# Patient Record
Sex: Male | Born: 1995
Health system: Southern US, Community
[De-identification: ages and names within clinical notes are randomized; demographics above are authoritative.]

## PROBLEM LIST (undated history)

## (undated) DIAGNOSIS — L0591 Pilonidal cyst without abscess: Secondary | ICD-10-CM

## (undated) DIAGNOSIS — J45909 Unspecified asthma, uncomplicated: Secondary | ICD-10-CM

## (undated) DIAGNOSIS — T7840XA Allergy, unspecified, initial encounter: Secondary | ICD-10-CM

## (undated) DIAGNOSIS — J982 Interstitial emphysema: Secondary | ICD-10-CM

## (undated) DIAGNOSIS — J069 Acute upper respiratory infection, unspecified: Secondary | ICD-10-CM

## (undated) DIAGNOSIS — F419 Anxiety disorder, unspecified: Secondary | ICD-10-CM

## (undated) HISTORY — DX: Pilonidal cyst without abscess: L05.91

## (undated) HISTORY — DX: Unspecified asthma, uncomplicated: J45.909

## (undated) HISTORY — DX: Anxiety disorder, unspecified: F41.9

## (undated) HISTORY — DX: Acute upper respiratory infection, unspecified: J06.9

## (undated) HISTORY — DX: Allergy, unspecified, initial encounter: T78.40XA

## (undated) HISTORY — PX: ADENOIDECTOMY: SUR15

## (undated) HISTORY — PX: TONSILLECTOMY: SUR1361

---
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
## 2015-02-15 ENCOUNTER — Ambulatory Visit (INDEPENDENT_AMBULATORY_CARE_PROVIDER_SITE_OTHER): Payer: 59 | Admitting: Urgent Care

## 2015-02-15 VITALS — BP 143/76 | HR 98 | Temp 98.2°F | Resp 20 | Ht 71.0 in | Wt 217.2 lb

## 2015-02-15 DIAGNOSIS — E669 Obesity, unspecified: Secondary | ICD-10-CM

## 2015-02-15 DIAGNOSIS — Z0000 Encounter for general adult medical examination without abnormal findings: Secondary | ICD-10-CM | POA: Diagnosis not present

## 2015-02-15 DIAGNOSIS — R635 Abnormal weight gain: Secondary | ICD-10-CM | POA: Diagnosis not present

## 2015-02-15 DIAGNOSIS — J302 Other seasonal allergic rhinitis: Secondary | ICD-10-CM

## 2015-02-15 DIAGNOSIS — F419 Anxiety disorder, unspecified: Secondary | ICD-10-CM

## 2015-02-15 DIAGNOSIS — J4520 Mild intermittent asthma, uncomplicated: Secondary | ICD-10-CM | POA: Diagnosis not present

## 2015-02-15 DIAGNOSIS — G4700 Insomnia, unspecified: Secondary | ICD-10-CM | POA: Diagnosis not present

## 2015-02-15 LAB — CBC
HCT: 44.2 % (ref 39.0–52.0)
Hemoglobin: 15 g/dL (ref 13.0–17.0)
MCH: 27.9 pg (ref 26.0–34.0)
MCHC: 33.9 g/dL (ref 30.0–36.0)
MCV: 82.2 fL (ref 78.0–100.0)
MPV: 10.5 fL (ref 8.6–12.4)
Platelets: 178 10*3/uL (ref 150–400)
RBC: 5.38 MIL/uL (ref 4.22–5.81)
RDW: 13 % (ref 11.5–15.5)
WBC: 5.9 10*3/uL (ref 4.0–10.5)

## 2015-02-15 LAB — TSH: TSH: 1.687 u[IU]/mL (ref 0.350–4.500)

## 2015-02-15 LAB — COMPREHENSIVE METABOLIC PANEL
ALT: 20 U/L (ref 9–46)
AST: 18 U/L (ref 10–40)
Albumin: 4.6 g/dL (ref 3.6–5.1)
Alkaline Phosphatase: 53 U/L (ref 40–115)
BUN: 14 mg/dL (ref 7–25)
CO2: 28 mmol/L (ref 20–31)
Calcium: 9.3 mg/dL (ref 8.6–10.3)
Chloride: 102 mmol/L (ref 98–110)
Creat: 0.94 mg/dL (ref 0.60–1.35)
Glucose, Bld: 85 mg/dL (ref 65–99)
Potassium: 4.4 mmol/L (ref 3.5–5.3)
Sodium: 140 mmol/L (ref 135–146)
Total Bilirubin: 0.5 mg/dL (ref 0.2–1.2)
Total Protein: 6.9 g/dL (ref 6.1–8.1)

## 2015-02-15 LAB — LIPID PANEL
Cholesterol: 202 mg/dL — ABNORMAL HIGH (ref 125–170)
HDL: 43 mg/dL (ref >=40)
LDL Cholesterol: 147 mg/dL — ABNORMAL HIGH (ref <110)
Total CHOL/HDL Ratio: 4.7 Ratio (ref <=5.0)
Triglycerides: 62 mg/dL (ref <150)
VLDL: 12 mg/dL (ref <30)

## 2015-02-15 LAB — POCT GLYCOSYLATED HEMOGLOBIN (HGB A1C): Hemoglobin A1C: 5.5

## 2015-02-15 MED ORDER — CETIRIZINE HCL 10 MG PO TABS: 10.0000 mg | ORAL_TABLET | Freq: Every day | ORAL | Status: DC

## 2015-02-15 MED ORDER — ESCITALOPRAM OXALATE 10 MG PO TABS: 10.0000 mg | ORAL_TABLET | Freq: Every day | ORAL | Status: DC

## 2015-02-15 MED ORDER — ALBUTEROL SULFATE HFA 108 (90 BASE) MCG/ACT IN AERS: 2.0000 | INHALATION_SPRAY | Freq: Four times a day (QID) | RESPIRATORY_TRACT | Status: DC | PRN

## 2015-02-15 MED ORDER — LORAZEPAM 0.5 MG PO TABS: 0.5000 mg | ORAL_TABLET | Freq: Two times a day (BID) | ORAL | Status: DC | PRN

## 2015-02-15 NOTE — Patient Instructions (Addendum)
Keeping you healthy  Get these tests  Blood pressure- Have your blood pressure checked once a year by your healthcare provider.  Normal blood pressure is 120/80.  Weight- Have your body mass index (BMI) calculated to screen for obesity.  BMI is a measure of body fat based on height and weight. You can also calculate your own BMI at https://www.west-esparza.com/.  Cholesterol- Have your cholesterol checked regularly starting at age 21, sooner may be necessary if you have diabetes, high blood pressure, if a family member developed heart diseases at an early age or if you smoke.   Chlamydia, HIV, and other sexual transmitted disease- Get screened each year until the age of 38 then within three months of each new sexual partner.  Diabetes- Have your blood sugar checked regularly if you have high blood pressure, high cholesterol, a family history of diabetes or if you are overweight.  Get these vaccines  Flu shot- Every fall.  Tetanus shot- Every 10 years.  Menactra- Single dose; prevents meningitis.  Take these steps  Don't smoke- If you do smoke, ask your healthcare provider about quitting. For tips on how to quit, go to www.smokefree.gov or call 1-800-QUIT-NOW.  Be physically active- Exercise 5 days a week for at least 30 minutes.  If you are not already physically active start slow and gradually work up to 30 minutes of moderate physical activity.  Examples of moderate activity include walking briskly, mowing the yard, dancing, swimming bicycling, etc.  Eat a healthy diet- Eat a variety of healthy foods such as fruits, vegetables, low fat milk, low fat cheese, yogurt, lean meats, poultry, fish, beans, tofu, etc.  For more information on healthy eating, go to www.thenutritionsource.org  Drink alcohol in moderation- Limit alcohol intake two drinks or less a day.  Never drink and drive.  Dentist- Brush and floss teeth twice daily; visit your dentis twice a year.  Depression-Your emotional  health is as important as your physical health.  If you're feeling down, losing interest in things you normally enjoy please talk with your healthcare provider.  Gun Safety- If you keep a gun in your home, keep it unloaded and with the safety lock on.  Bullets should be stored separately.  Helmet use- Always wear a helmet when riding a motorcycle, bicycle, rollerblading or skateboarding.  Safe sex- If you may be exposed to a sexually transmitted infection, use a condom  Seat belts- Seat bels can save your life; always wear one.  Smoke/Carbon Monoxide detectors- These detectors need to be installed on the appropriate level of your home.  Replace batteries at least once a year.  Skin Cancer- When out in the sun, cover up and use sunscreen SPF 15 or higher.  Violence- If anyone is threatening or hurting you, please tell your healthcare provider.   Generalized Anxiety Disorder Generalized anxiety disorder (GAD) is a mental disorder. It interferes with life functions, including relationships, work, and school. GAD is different from normal anxiety, which everyone experiences at some point in their lives in response to specific life events and activities. Normal anxiety actually helps [place] prepare for and get through these life events and activities. Normal anxiety goes away after the event or activity is over.  GAD causes anxiety that is not necessarily related to specific events or activities. It also causes excess anxiety in proportion to specific events or activities. The anxiety associated with GAD is also difficult to control. GAD can vary from mild to severe. People with severe GAD can  have intense waves of anxiety with physical symptoms (panic attacks).  SYMPTOMS The anxiety and worry associated with GAD are difficult to control. This anxiety and worry are related to many life events and activities and also occur more days than not for 6 months or longer. People with GAD also have three or more  of the following symptoms (one or more in children): 6. Restlessness.  7. Fatigue. 8. Difficulty concentrating.  9. Irritability. 10. Muscle tension. 11. Difficulty sleeping or unsatisfying sleep. DIAGNOSIS GAD is diagnosed through an assessment by your health care provider. Your health care provider will ask you questions aboutyour mood,physical symptoms, and events in your life. Your health care provider may ask you about your medical history and use of alcohol or drugs, including prescription medicines. Your health care provider may also do a physical exam and blood tests. Certain medical conditions and the use of certain substances can cause symptoms similar to those associated with GAD. Your health care provider may refer you to a mental health specialist for further evaluation. TREATMENT The following therapies are usually used to treat GAD:  4. Medication. Antidepressant medication usually is prescribed for long-term daily control. Antianxiety medicines may be added in severe cases, especially when panic attacks occur.  5. Talk therapy (psychotherapy). Certain types of talk therapy can be helpful in treating GAD by providing support, education, and guidance. A form of talk therapy called cognitive behavioral therapy can teach you healthy ways to think about and react to daily life events and activities. 6. Stress managementtechniques. These include yoga, meditation, and exercise and can be very helpful when they are practiced regularly. A mental health specialist can help determine which treatment is best for you. Some people see improvement with one therapy. However, other people require a combination of therapies.   This information is not intended to replace advice given to you by your health care provider. Make sure you discuss any questions you have with your health care provider.   Document Released: 05/03/2012 Document Revised: 01/27/2014 Document Reviewed: 05/03/2012 Elsevier  Interactive Patient Education 2016 [employer].    Escitalopram tablets What is this medicine? ESCITALOPRAM (es sye TAL oh pram) is used to treat depression and certain types of anxiety. This medicine may be used for other purposes; ask your health care provider or pharmacist if you have questions. What should I tell my health care provider before I take this medicine? They need to know if you have any of these conditions: -bipolar disorder or a family history of bipolar disorder -diabetes -glaucoma -heart disease -kidney or liver disease -receiving electroconvulsive therapy -seizures (convulsions) -suicidal thoughts, plans, or attempt by you or a family member -an unusual or allergic reaction to escitalopram, the related drug citalopram, other medicines, foods, dyes, or preservatives -pregnant or trying to become pregnant -breast-feeding How should I use this medicine? Take this medicine by mouth with a glass of water. Follow the directions on the prescription label. You can take it with or without food. If it upsets your stomach, take it with food. Take your medicine at regular intervals. Do not take it more often than directed. Do not stop taking this medicine suddenly except upon the advice of your doctor. Stopping this medicine too quickly may cause serious side effects or your condition may worsen. A special MedGuide will be given to you by the pharmacist with each prescription and refill. Be sure to read this information carefully each time. Talk to your pediatrician regarding the use of this  medicine in children. Special care may be needed. Overdosage: If you think you have taken too much of this medicine contact a poison control center or emergency room at once. NOTE: This medicine is only for you. Do not share this medicine with others. What if I miss a dose? If you miss a dose, take it as soon as you can. If it is almost time for your next dose, take only that dose. Do not  take double or extra doses. What may interact with this medicine? Do not take this medicine with any of the following medications: -certain medicines for fungal infections like fluconazole, itraconazole, ketoconazole, posaconazole, voriconazole -cisapride -citalopram -dofetilide -dronedarone -linezolid -MAOIs like Carbex, Eldepryl, Marplan, Nardil, and Parnate -methylene blue (injected into a vein) -pimozide -thioridazine -ziprasidone This medicine may also interact with the following medications: -alcohol -aspirin and aspirin-like medicines -carbamazepine -certain medicines for depression, anxiety, or psychotic disturbances -certain medicines for migraine headache like almotriptan, eletriptan, frovatriptan, naratriptan, rizatriptan, sumatriptan, zolmitriptan -certain medicines for sleep -certain medicines that treat or prevent blood clots like warfarin, enoxaparin, dalteparin -cimetidine -diuretics -fentanyl -furazolidone -isoniazid -lithium -metoprolol -NSAIDs, medicines for pain and inflammation, like ibuprofen or naproxen -other medicines that prolong the QT interval (cause an abnormal heart rhythm) -procarbazine -rasagiline -supplements like St. John's wort, kava kava, valerian -tramadol -tryptophan This list may not describe all possible interactions. Give your health care provider a list of all the medicines, herbs, non-prescription drugs, or dietary supplements you use. Also tell them if you smoke, drink alcohol, or use illegal drugs. Some items may interact with your medicine. What should I watch for while using this medicine? Tell your doctor if your symptoms do not get better or if they get worse. Visit your doctor or health care professional for regular checks on your progress. Because it may take several weeks to see the full effects of this medicine, it is important to continue your treatment as prescribed by your doctor. Patients and their families should watch  out for new or worsening thoughts of suicide or depression. Also watch out for sudden changes in feelings such as feeling anxious, agitated, panicky, irritable, hostile, aggressive, impulsive, severely restless, overly excited and hyperactive, or not being able to sleep. If this happens, especially at the beginning of treatment or after a change in dose, call your health care professional. Bonita Quin may get drowsy or dizzy. Do not drive, use machinery, or do anything that needs mental alertness until you know how this medicine affects you. Do not stand or sit up quickly, especially if you are an older patient. This reduces the risk of dizzy or fainting spells. Alcohol may interfere with the effect of this medicine. Avoid alcoholic drinks. Your mouth may get dry. Chewing sugarless gum or sucking hard candy, and drinking plenty of water may help. Contact your doctor if the problem does not go away or is severe. What side effects may I notice from receiving this medicine? Side effects that you should report to your doctor or health care professional as soon as possible: -allergic reactions like skin rash, itching or hives, swelling of the face, lips, or tongue -confusion -feeling faint or lightheaded, falls -fast talking and excited feelings or actions that are out of control -hallucination, loss of contact with reality -seizures -suicidal thoughts or other mood changes -unusual bleeding or bruising Side effects that usually do not require medical attention (report to your doctor or health care professional if they continue or are bothersome): -blurred vision -changes in appetite -  change in sex drive or performance -headache -increased sweating -nausea This list may not describe all possible side effects. Call your doctor for medical advice about side effects. You may report side effects to FDA at 1-800-FDA-1088. Where should I keep my medicine? Keep out of reach of children. Store at room temperature  between 15 and 30 degrees C (59 and 86 degrees F). Throw away any unused medicine after the expiration date. NOTE: This sheet is a summary. It may not cover all possible information. If you have questions about this medicine, talk to your doctor, pharmacist, or health care provider.    2016, Elsevier/Gold Standard. (2012-08-03 12:32:55)

## 2015-02-15 NOTE — Progress Notes (Signed)
MRN: [id number]  Subjective:   [name] is a 20 y.o. male presenting for annual physical exam and anxiety, dizziness, weight loss.  Medical care team includes: PCP: No primary care provider on file. Specialists: None.   Patient is single, lives with his father. Recently moved here from [place] where he had been living since 2011. Denies smoking cigarettes or drinking alcohol.   Anxiety, weight, dizziness, chest pain - In 2015-2016, patient went from 230lbs to 180lbs without dieting or exercise. He had decreased appetite, intermittent nausea and vomiting, intermittent headaches, dizziness. Work up was negative per patient. Since 12/2014, patient has gained weight back and has had difficulty with binge eating. He states that he eats healthily but also reports that he finds whatever food he can get at home. Patient has long-standing history of anxiety. In 2016, he started having episodes of shaking, crying spells, feeling overwhelmed, heart racing. Episodes have been occuring daily in the past week. On other occasions, patient has had chest pain which was evaluated in the ED in [place] had normal findings and recommendations for treatment of anxiety. Patient has tried very hard to calm himself during these episodes but is having a very difficult time managing his anxiety.    Michael Lam  does not have a problem list on file.  Michael Lam has a current medication list which includes the following prescription(s): albuterol, albuterol, loratadine, and montelukast. He has No Known Allergies.  Michael Lam  has a past medical history of Allergy; Anxiety; and Asthma. Also  has past surgical history that includes Tonsillectomy.  His family history includes Diabetes in his father; Hyperlipidemia in his father; Hypertension in his father.  Immunizations: Patient reports that he is up to date.  ROS   Objective:   Vitals: BP 143/76 mmHg  Pulse 98  Temp(Src) 98.2 F (36.8 C) (Oral)   Resp 20  Ht  (1.803 m)  Wt 217 lb 3.2 oz (98.521 kg)  BMI 30.31 kg/m2  SpO2 99%  Physical Exam  Constitutional: He is oriented to person, place, and time. He appears well-developed and well-nourished.  HENT:  TM's flat bilaterally, no effusions or erythema. Nasal turbinates boggy and edematous. No sinus tenderness. Postnasal drip present, without oropharyngeal exudates, erythema or abscesses.  Eyes: Conjunctivae and EOM are normal. Pupils are equal, round, and reactive to light. Right eye exhibits no discharge. Left eye exhibits no discharge. No scleral icterus.  Neck: Normal range of motion. Neck supple. No thyromegaly present.  Cardiovascular: Normal rate, regular rhythm and intact distal pulses.  Exam reveals no gallop and no friction rub.   No murmur heard. Pulmonary/Chest: No stridor. No respiratory distress. He has no wheezes. He has no rales.  Abdominal: Soft. Bowel sounds are normal. He exhibits no distension and no mass. There is no tenderness.  Musculoskeletal: Normal range of motion. He exhibits no edema or tenderness.  Lymphadenopathy:    He has no cervical adenopathy.  Neurological: He is alert and oriented to person, place, and time. He has normal reflexes. Coordination normal.  Skin: Skin is warm and dry. No rash noted. No erythema. No pallor.  Psychiatric: His mood appears anxious. His speech is not rapid and/or pressured and not delayed. He is not agitated and not aggressive. He expresses no homicidal and no suicidal ideation.   Results for orders placed or performed in visit on 02/15/15 (from the past 24 hour(s))  POCT glycosylated hemoglobin (Hb A1C)     Status: None  Collection Time: 02/15/15  5:10 PM  Result Value Ref Range   Hemoglobin A1C 5.5    Assessment and Plan :   1. Annual physical exam - Labs pending, patient is medically stable - Discussed healthy lifestyle, diet, exercise, preventative care, vaccinations, and addressed patient's concerns.      2. Anxiety 3. Weight gain 4. Obesity 5. Insomnia - Labs pending, suspect that multiple symptoms are anxiety related. Counseled patient on diagnosis. Patient agreed to start trial of escitalopram. Lorazepam twice daily as needed for anxiety attacks, insomnia. Patient to follow up in 6 weeks.  6. Extrinsic asthma, mild intermittent, uncomplicated - Refilled albuterol inhaler  7. Seasonal allergies - Switch to Zyrtec  Wallis Bamberg, PA-C [facility] [phone] 02/15/2015  4:42 PM

## 2015-02-16 ENCOUNTER — Encounter: Payer: Self-pay | Admitting: Urgent Care

## 2015-03-02 ENCOUNTER — Ambulatory Visit (INDEPENDENT_AMBULATORY_CARE_PROVIDER_SITE_OTHER): Payer: 59 | Admitting: Family Medicine

## 2015-03-02 VITALS — BP 120/72 | HR 82 | Temp 98.6°F | Resp 17 | Ht 72.0 in | Wt 221.0 lb

## 2015-03-02 DIAGNOSIS — R6889 Other general symptoms and signs: Secondary | ICD-10-CM | POA: Diagnosis not present

## 2015-03-02 DIAGNOSIS — J111 Influenza due to unidentified influenza virus with other respiratory manifestations: Secondary | ICD-10-CM | POA: Diagnosis not present

## 2015-03-02 DIAGNOSIS — E042 Nontoxic multinodular goiter: Secondary | ICD-10-CM

## 2015-03-02 DIAGNOSIS — R05 Cough: Secondary | ICD-10-CM | POA: Diagnosis not present

## 2015-03-02 DIAGNOSIS — J4521 Mild intermittent asthma with (acute) exacerbation: Secondary | ICD-10-CM | POA: Diagnosis not present

## 2015-03-02 DIAGNOSIS — J301 Allergic rhinitis due to pollen: Secondary | ICD-10-CM

## 2015-03-02 DIAGNOSIS — R6883 Chills (without fever): Secondary | ICD-10-CM | POA: Diagnosis not present

## 2015-03-02 LAB — POCT CBC
Granulocyte percent: 53.7 %G (ref 37–80)
HCT, POC: 40.2 % — AB (ref 43.5–53.7)
Hemoglobin: 14.1 g/dL (ref 14.1–18.1)
Lymph, poc: 2.6 (ref 0.6–3.4)
MCH, POC: 28.8 pg (ref 27–31.2)
MCHC: 35.2 g/dL (ref 31.8–35.4)
MCV: 81.9 fL (ref 80–97)
MID (cbc): 0.8 (ref 0–0.9)
MPV: 7.6 fL (ref 0–99.8)
POC Granulocyte: 4 (ref 2–6.9)
POC LYMPH PERCENT: 34.9 %L (ref 10–50)
POC MID %: 11.4 %M (ref 0–12)
Platelet Count, POC: 166 10*3/uL (ref 142–424)
RBC: 4.9 M/uL (ref 4.69–6.13)
RDW, POC: 12.9 %
WBC: 7.4 10*3/uL (ref 4.6–10.2)

## 2015-03-02 LAB — POCT INFLUENZA A/B
Influenza A, POC: NEGATIVE
Influenza B, POC: NEGATIVE

## 2015-03-02 MED ORDER — MUCINEX DM 30-600 MG PO TB12: 1.0000 | ORAL_TABLET | Freq: Two times a day (BID) | ORAL | Status: DC

## 2015-03-02 MED ORDER — OSELTAMIVIR PHOSPHATE 75 MG PO CAPS: 75.0000 mg | ORAL_CAPSULE | Freq: Two times a day (BID) | ORAL | Status: DC

## 2015-03-02 NOTE — Patient Instructions (Signed)
1.  Start AFRIN NASAL SPRAY --- 2 SPRAYS INTO EACH NOSTRIL TWICE DAILY FOR SEVEN DAYS. 2.  ALBUTEROL INHALER 2 PUFFS FOUR TIMES DAILY FOR ONE WEEK AND THEN AS NEEDED.   Influenza, Adult Influenza ("the flu") is a viral infection of the respiratory tract. It occurs more often in winter months because people spend more time in close contact with one another. Influenza can make you feel very sick. Influenza easily spreads from person to person (contagious). CAUSES  Influenza is caused by a virus that infects the respiratory tract. You can catch the virus by breathing in droplets from an infected person's cough or sneeze. You can also catch the virus by touching something that was recently contaminated with the virus and then touching your mouth, nose, or eyes. RISKS AND COMPLICATIONS You may be at risk for a more severe case of influenza if you smoke cigarettes, have diabetes, have chronic heart disease (such as heart failure) or lung disease (such as asthma), or if you have a weakened immune system. Elderly people and pregnant women are also at risk for more serious infections. The most common problem of influenza is a lung infection (pneumonia). Sometimes, this problem can require emergency medical care and may be life threatening. SIGNS AND SYMPTOMS  Symptoms typically last 4 to 10 days and may include:  Fever.  Chills.  Headache, body aches, and muscle aches.  Sore throat.  Chest discomfort and cough.  Poor appetite.  Weakness or feeling tired.  Dizziness.  Nausea or vomiting. DIAGNOSIS  Diagnosis of influenza is often made based on your history and a physical exam. A nose or throat swab test can be done to confirm the diagnosis. TREATMENT  In mild cases, influenza goes away on its own. Treatment is directed at relieving symptoms. For more severe cases, your health care provider may prescribe antiviral medicines to shorten the sickness. Antibiotic medicines are not effective because  the infection is caused by a virus, not by bacteria. HOME CARE INSTRUCTIONS  Take medicines only as directed by your health care provider.  Use a cool mist humidifier to make breathing easier.  Get plenty of rest until your temperature returns to normal. This usually takes 3 to 4 days.  Drink enough fluid to keep your urine clear or pale yellow.  Cover yourmouth and nosewhen coughing or sneezing,and wash your handswellto prevent thevirusfrom spreading.  Stay homefromwork orschool untilthe fever is gonefor at least 69full day. PREVENTION  An annual influenza vaccination (flu shot) is the best way to avoid getting influenza. An annual flu shot is now routinely recommended for all adults in the [place] SEEK MEDICAL CARE IF:  You experiencechest pain, yourcough worsens,or you producemore mucus.  Youhave nausea,vomiting, ordiarrhea.  Your fever returns or gets worse. SEEK IMMEDIATE MEDICAL CARE IF:  You havetrouble breathing, you become short of breath,or your skin ornails becomebluish.  You have severe painor stiffnessin the neck.  You develop a sudden headache, or pain in the face or ear.  You have nausea or vomiting that you cannot control. MAKE SURE YOU:   Understand these instructions.  Will watch your condition.  Will get help right away if you are not doing well or get worse.   This information is not intended to replace advice given to you by your health care provider. Make sure you discuss any questions you have with your health care provider.   Document Released: 01/04/2000 Document Revised: 01/27/2014 Document Reviewed: 04/07/2011 Elsevier Interactive Patient Education [employer]

## 2015-03-02 NOTE — Progress Notes (Signed)
Subjective:    Patient ID: Michael Lam, male    DOB: 1995-04-22, 20 y.o.   MRN: [id number]  03/02/2015  Nasal Congestion; URI; Cough; and Insomnia   HPI This 20 y.o. male presents for evaluation of URI symptoms.  Onset five days ago.  Itchy throat; has really bad allergies. Nasal congestion horrible.  Sore throat worsened.  Coughing.  Dizzy; drowsy.  +Chills/sweats.  Chest pain with coughing.  HA in morning and at nights.  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  No v/d.  S/p flu vaccine in last month.  Moved from [place] in 12/2014.  [name]  Using Singulair.  Nasal spray/Fluticasone.  +body aches yet started working this week.    PCP:   Urban Gibson.   Thyroid nodules: detected on thyroid US 04/2014 in [place] recent TSH normal 02/16/15.  Due for repeat thyroid US. Review of Systems  Constitutional: Negative for fever, chills, diaphoresis, activity change, appetite change and fatigue.  Respiratory: Negative for cough and shortness of breath.   Cardiovascular: Negative for chest pain, palpitations and leg swelling.  Gastrointestinal: Negative for nausea, vomiting, abdominal pain and diarrhea.  Endocrine: Negative for cold intolerance, heat intolerance, polydipsia, polyphagia and polyuria.  Skin: Negative for color change, rash and wound.  Neurological: Negative for dizziness, tremors, seizures, syncope, facial asymmetry, speech difficulty, weakness, light-headedness, numbness and headaches.  Psychiatric/Behavioral: Negative for sleep disturbance and dysphoric mood. The patient is not nervous/anxious.     Past Medical History  Diagnosis Date  . Allergy   . Anxiety   . Asthma    Past Surgical History  Procedure Laterality Date  . Tonsillectomy     No Known Allergies  Social History   Social History  . Marital Status: Single    Spouse Name: N/A  . Number of Children: N/A    . Years of Education: N/A   Occupational History  . Not on file.   Social History Main Topics  . Smoking status: Never Smoker   . Smokeless tobacco: Not on file  . Alcohol Use: No  . Drug Use: No  . Sexual Activity: Not on file   Other Topics Concern  . Not on file   Social History Narrative   Family History  Problem Relation Age of Onset  . Hyperlipidemia Father   . Hypertension Father   . Diabetes Father        Objective:    BP 120/72 mmHg  Pulse 82  Temp(Src) 98.6 F (37 C) (Oral)  Resp 17  Ht 6' (1.829 m)  Wt 221 lb (100.245 kg)  BMI 29.97 kg/m2  SpO2 98% Physical Exam  Constitutional: He is oriented to person, place, and time. He appears well-developed and well-nourished. No distress.  HENT:  Head: Normocephalic and atraumatic.  Eyes: Conjunctivae and EOM are normal. Pupils are equal, round, and reactive to light.  Neck: Normal range of motion. Neck supple. Carotid bruit is not present. No thyromegaly present.  Cardiovascular: Normal rate, regular rhythm, normal heart sounds and intact distal pulses.  Exam reveals no gallop and no friction rub.   No murmur heard. Pulmonary/Chest: Effort normal and breath sounds normal. He has no wheezes. He has no rales.  Lymphadenopathy:    He has no cervical adenopathy.  Neurological: He is alert and oriented to person, place, and time. No cranial nerve deficit.  Skin: Skin is warm and dry. No  rash noted. He is not diaphoretic.  Psychiatric: He has a normal mood and affect. His behavior is normal.  Nursing note and vitals reviewed.  Results for orders placed or performed in visit on 03/02/15  POCT CBC  Result Value Ref Range   WBC 7.4 4.6 - 10.2 K/uL   Lymph, poc 2.6 0.6 - 3.4   POC LYMPH PERCENT 34.9 10 - 50 %L   MID (cbc) 0.8 0 - 0.9   POC MID % 11.4 0 - 12 %M   POC Granulocyte 4.0 2 - 6.9   Granulocyte percent 53.7 37 - 80 %G   RBC 4.90 4.69 - 6.13 M/uL   Hemoglobin 14.1 14.1 - 18.1 g/dL   HCT, POC 16.1 (A)  09.6 - 53.7 %   MCV 81.9 80 - 97 fL   MCH, POC 28.8 27 - 31.2 pg   MCHC 35.2 31.8 - 35.4 g/dL   RDW, POC 04.5 %   Platelet Count, POC 166 142 - 424 K/uL   MPV 7.6 0 - 99.8 fL  POCT Influenza A/B  Result Value Ref Range   Influenza A, POC Negative Negative   Influenza B, POC Negative Negative       Assessment & Plan:   1. Influenza   2. Flu-like symptoms   3. Asthma with acute exacerbation, mild intermittent   4. Allergic rhinitis due to pollen   5. Multiple thyroid nodules     Orders Placed This Encounter  Procedures  . US Soft Tissue Head/Neck    220 LBS/NO NEEDS/INS/UMR/RLC/PT W/EPIC ORDER    Standing Status: Future     Number of Occurrences: 1     Standing Expiration Date: 04/29/2016    Order Specific Question:  Reason for Exam (SYMPTOM  OR DIAGNOSIS REQUIRED)    Answer:  thyroid nodules detected on thyroid US 04/2014 in [place]    Order Specific Question:  Preferred imaging location?    Answer:  [facility]  . POCT CBC  . POCT Influenza A/B   Meds ordered this encounter  Medications  . oseltamivir (TAMIFLU) 75 MG capsule    Sig: Take 1 capsule (75 mg total) by mouth 2 (two) times daily.    Dispense:  10 capsule    Refill:  0  . Dextromethorphan-Guaifenesin (MUCINEX DM) 30-600 MG TB12    Sig: Take 1 tablet by mouth 2 (two) times daily.    Dispense:  28 each    Refill:  0    No Follow-up on file.    Lenola Lockner Paulita Fujita, M.D. [facility] [address] [place], [place]  [address] [phone] phone [phone] fax

## 2015-03-15 ENCOUNTER — Ambulatory Visit
Admission: RE | Admit: 2015-03-15 | Discharge: 2015-03-15 | Disposition: A | Discharge status: Home / Self Care | Payer: 59 | Source: Ambulatory Visit | Attending: Family Medicine | Admitting: Family Medicine

## 2015-03-15 DIAGNOSIS — E042 Nontoxic multinodular goiter: Secondary | ICD-10-CM

## 2015-03-15 DIAGNOSIS — E041 Nontoxic single thyroid nodule: Secondary | ICD-10-CM | POA: Diagnosis not present

## 2015-04-13 ENCOUNTER — Ambulatory Visit (INDEPENDENT_AMBULATORY_CARE_PROVIDER_SITE_OTHER): Payer: 59 | Admitting: Physician Assistant

## 2015-04-13 VITALS — BP 112/62 | HR 93 | Temp 98.2°F | Resp 20 | Ht 72.0 in | Wt 211.8 lb

## 2015-04-13 DIAGNOSIS — F419 Anxiety disorder, unspecified: Secondary | ICD-10-CM | POA: Diagnosis not present

## 2015-04-13 DIAGNOSIS — R1084 Generalized abdominal pain: Secondary | ICD-10-CM | POA: Diagnosis not present

## 2015-04-13 DIAGNOSIS — R112 Nausea with vomiting, unspecified: Secondary | ICD-10-CM | POA: Diagnosis not present

## 2015-04-13 DIAGNOSIS — R197 Diarrhea, unspecified: Secondary | ICD-10-CM

## 2015-04-13 DIAGNOSIS — R631 Polydipsia: Secondary | ICD-10-CM

## 2015-04-13 LAB — POCT CBC
Granulocyte percent: 87.9 %G — AB (ref 37–80)
HCT, POC: 45.3 % (ref 43.5–53.7)
Hemoglobin: 16.2 g/dL (ref 14.1–18.1)
Lymph, poc: 1 (ref 0.6–3.4)
MCH, POC: 29.1 pg (ref 27–31.2)
MCHC: 35.7 g/dL — AB (ref 31.8–35.4)
MCV: 81.6 fL (ref 80–97)
MID (cbc): 0.4 (ref 0–0.9)
MPV: 7.9 fL (ref 0–99.8)
POC Granulocyte: 10.3 — AB (ref 2–6.9)
POC LYMPH PERCENT: 8.4 %L — AB (ref 10–50)
POC MID %: 3.7 %M (ref 0–12)
Platelet Count, POC: 180 10*3/uL (ref 142–424)
RBC: 5.55 M/uL (ref 4.69–6.13)
RDW, POC: 13 %
WBC: 11.7 10*3/uL — AB (ref 4.6–10.2)

## 2015-04-13 LAB — POCT URINALYSIS DIP (MANUAL ENTRY)
Glucose, UA: NEGATIVE
Leukocytes, UA: NEGATIVE
Nitrite, UA: NEGATIVE
Protein Ur, POC: 100 — AB
Spec Grav, UA: >=1.03
Urobilinogen, UA: 0.2
pH, UA: 6

## 2015-04-13 LAB — GLUCOSE, POCT (MANUAL RESULT ENTRY): POC Glucose: 114 mg/dl — AB (ref 70–99)

## 2015-04-13 MED ORDER — ONDANSETRON 4 MG PO TBDP: 4.0000 mg | ORAL_TABLET | Freq: Three times a day (TID) | ORAL | Status: DC | PRN

## 2015-04-13 MED ORDER — DICYCLOMINE HCL 10 MG PO CAPS: 10.0000 mg | ORAL_CAPSULE | Freq: Three times a day (TID) | ORAL | Status: DC

## 2015-04-13 MED ORDER — ONDANSETRON 4 MG PO TBDP
4.0000 mg | ORAL_TABLET | Freq: Once | ORAL | Status: AC
Start: 2015-04-13 — End: 2015-04-13
  Administered 2015-04-13: 4 mg via ORAL

## 2015-04-13 NOTE — Progress Notes (Signed)
Michael Lam  MRN: [id number] DOB: 09-12-1995  Subjective:   The patient is a 20 year old hispanic male with PMH of asthma and hypoglycemia (diagnosed in October 2016) who presents with acute onset of nausea, emesis and diarrhea.  11:30pm last night he felt nauseous after his shower, started to vomit. Vomiting various times until this morning about 20 times. Vomit was food then liquid then acid contents. Diarrhea started around 3:30am, denies melena or hematochezia. Patient tried gatoraide, ginger ale, pepto-bismol, all with no relief. Patient was having episodes of emesis up until in waiting room before the visit. Abdominal pain and cramping, hears stomach gurgling. Patient got no sleep due to vomiting all night. Patient endorses dizziness and lightheadedness, which he attributes to dehydration.  The patient also endorses back pain, right and left flank pain, denies dysuria but endorses urinary frequency and urgency. Also over the past week and a half patient has had increased thirst, night sweats and increased urination.  The patient ate potato salad last night but otherwise does not know of any aggrivating factors. His father also started having acute onset of nausea and emesis last night. Both patient and father both work at [employer], always around sick contacts.  There are no active problems to display for this patient.   Current Outpatient Prescriptions on File Prior to Visit  Medication Sig Dispense Refill  . albuterol (PROVENTIL HFA;VENTOLIN HFA) 108 (90 Base) MCG/ACT inhaler Inhale 2 puffs into the lungs every 6 (six) hours as needed for wheezing or shortness of breath (cough, shortness of breath or wheezing.). 1 Inhaler 6  . albuterol (PROVENTIL) (2.5 MG/3ML) 0.083% nebulizer solution Take 2.5 mg by nebulization every 6 (six) hours as needed for wheezing or shortness of breath.    . cetirizine (ZYRTEC) 10 MG tablet Take 1 tablet (10 mg total) by mouth daily. 30 tablet 11    . Dextromethorphan-Guaifenesin (MUCINEX DM) 30-600 MG TB12 Take 1 tablet by mouth 2 (two) times daily. 28 each 0  . LORazepam (ATIVAN) 0.5 MG tablet Take 1 tablet (0.5 mg total) by mouth 2 (two) times daily as needed for anxiety. 30 tablet 1  . montelukast (SINGULAIR) 10 MG tablet Take 10 mg by mouth at bedtime.    Marland Kitchen oseltamivir (TAMIFLU) 75 MG capsule Take 1 capsule (75 mg total) by mouth 2 (two) times daily. 10 capsule 0  . escitalopram (LEXAPRO) 10 MG tablet Take 1 tablet (10 mg total) by mouth daily. (Patient not taking: Reported on 03/02/2015) 60 tablet 1   No current facility-administered medications on file prior to visit.    No Known Allergies  Review of Systems  Constitutional: Negative for fever and chills.  Gastrointestinal: Positive for nausea, vomiting, abdominal pain (crampy) and diarrhea. Negative for constipation.   Objective:  BP 112/62 mmHg  Pulse 93  Temp(Src) 98.2 F (36.8 C) (Oral)  Resp 20  Ht 6' (1.829 m)  Wt 211 lb 12.8 oz (96.072 kg)  BMI 28.72 kg/m2  SpO2 99%  Physical Exam  Constitutional: He is oriented to person, place, and time and well-developed, well-nourished, and in no distress.  HENT:  Head: Normocephalic and atraumatic.  Right Ear: External ear normal.  Left Ear: External ear normal.  Eyes: Conjunctivae are normal.  Neck: Normal range of motion.  Cardiovascular: Normal rate, regular rhythm and normal heart sounds.   No murmur heard. Pulmonary/Chest: Effort normal and breath sounds normal. He has no wheezes.  Abdominal: Soft. Bowel sounds are normal. He exhibits no  mass. There is tenderness (generalized). There is no rebound and no guarding.  Neurological: He is alert and oriented to person, place, and time. Gait normal.  Skin: Skin is warm and dry.  Psychiatric: Mood, memory, affect and judgment normal.   Orthostatic VS for the past 24 hrs:  BP- Lying Pulse- Lying BP- Sitting Pulse- Sitting BP- Standing at 0 minutes Pulse- Standing at 0  minutes  04/13/15 1713 121/78 mmHg 105 114/79 mmHg 98 124/76 mmHg 121   Results for orders placed or performed in visit on 04/13/15  POCT glucose (manual entry)  Result Value Ref Range   POC Glucose 114 (A) 70 - 99 mg/dl  POCT urinalysis dipstick  Result Value Ref Range   Color, UA yellow yellow   Clarity, UA clear clear   Glucose, UA negative negative   Bilirubin, UA moderate (A) negative   Ketones, POC UA trace (5) (A) negative   Spec Grav, UA >=1.030    Blood, UA small (A) negative   pH, UA 6.0    Protein Ur, POC =100 (A) negative   Urobilinogen, UA 0.2    Nitrite, UA Negative Negative   Leukocytes, UA Negative Negative  POCT CBC  Result Value Ref Range   WBC 11.7 (A) 4.6 - 10.2 K/uL   Lymph, poc 1.0 0.6 - 3.4   POC LYMPH PERCENT 8.4 (A) 10 - 50 %L   MID (cbc) 0.4 0 - 0.9   POC MID % 3.7 0 - 12 %M   POC Granulocyte 10.3 (A) 2 - 6.9   Granulocyte percent 87.9 (A) 37 - 80 %G   RBC 5.55 4.69 - 6.13 M/uL   Hemoglobin 16.2 14.1 - 18.1 g/dL   HCT, POC 09.845.3 11.943.5 - 53.7 %   MCV 81.6 80 - 97 fL   MCH, POC 29.1 27 - 31.2 pg   MCHC 35.7 (A) 31.8 - 35.4 g/dL   RDW, POC 14.713.0 %   Platelet Count, POC 180 142 - 424 K/uL   MPV 7.9 0 - 99.8 fL      Assessment and Plan :  Non-intractable vomiting with nausea, unspecified vomiting type - Plan: Orthostatic vital signs, ondansetron (ZOFRAN-ODT) disintegrating tablet 4 mg, ondansetron (ZOFRAN ODT) 4 MG disintegrating tablet  Generalized abdominal pain - Plan: dicyclomine (BENTYL) 10 MG capsule  Diarrhea, unspecified type - Plan: POCT CBC, Orthostatic vital signs  Polydipsia - Plan: POCT glucose (manual entry), POCT urinalysis dipstick, COMPLETE METABOLIC PANEL WITH GFR   Patient is tolerating oral fluids ok at this point and due to not being orthostatic he would lie to return home and continue oral hydration.  We will give him something for nausea and abd cramping to allow him to feel better and stay hydrated.  He will advance diet as  tolerated and we expect as he eats more his stool will become more formed.  His urine is concentrated so he will continue to focus on his oral hydration.  His WBC is elevated likely due to GI illness - he will RTC with warning signs that were discussed with him and his mother while in the office.  Benny LennertSarah Montoya Watkin PA-C  [facility] 04/13/2015 7:50 PM

## 2015-04-13 NOTE — Patient Instructions (Addendum)
   Start with ice chips and then sips of clear fluids.  Once you are able to tolerated drinking liquids you can start with bland foods such as rice, apple sauce and toast.  If you can tolerate this you can advance diet as tolerated.  Limit dairy for several days to reduce return of diarrhea if you have been experiencing diarrhea.   IF you received an x-ray today, you will receive an invoice from [facility]. Please contact [facility] at [phone] with questions or concerns regarding your invoice.   IF you received labwork today, you will receive an invoice from [facility]. Please contact [facility] at [phone] with questions or concerns regarding your invoice.   Our billing staff will not be able to assist you with questions regarding bills from these companies.  You will be contacted with the lab results as soon as they are available. The fastest way to get your results is to activate your My Chart account. Instructions are located on the last page of this paperwork. If you have not heard from us regarding the results in 2 weeks, please contact this office.

## 2015-04-13 NOTE — Progress Notes (Signed)
Subjective:     Patient ID: Michael Lam, male   DOB: 04/26/1995, 20 y.o.   MRN: [id number]  HPI The patient is a 20 year old hispanic male with PMH of asthma and hypoglycemia (diagnosed in October 2016) who presents with acute onset of nausea, emesis and diarrhea.  11:30pm last night he felt nauseous after his shower, started to vomit.  Vomiting various times until this morning about 20 times. Vomit was food the liquid then acid contents. Diarrhea started around 3:30am, denies melena or hematochezia. Patient tried gatoraide, ginger ale, pepto-bismol, all with no relief. Patient was having episodes of emesis up until in waiting room before the visit. Abdominal pain and cramping, hears stomach gurgling. Patient got no sleep due to vomiting all night. Patient endorses dizziness and lightheadedness, which he attributes to dehydration.  The patient also endorses back pain, right and left flank pain, denies dysuria but endorses urinary frequency and urgency. Also over the past week and a half patient has had increased thirst, night sweats and increased urination.  The patient ate potato salad last night but otherwise does not know of any aggrivating factors. His father also started having acute onset of nausea and emesis last night. Both patient and father both work at [employer], always around sick contacts.  Review of Systems All pertinent ROS as above in HPI    Objective:   Physical Exam  Constitutional: He appears well-developed and well-nourished.  HENT:  Head: Normocephalic and atraumatic.  Eyes: Conjunctivae are normal.  Neck: Neck supple.  Cardiovascular: Normal rate, regular rhythm, normal heart sounds and intact distal pulses.  Exam reveals no gallop and no friction rub.   No murmur heard. Pulmonary/Chest: Effort normal and breath sounds normal.  Abdominal:  Tenderness to palpation over the entire abdomen, patient grimaces to epigastric palpation. Positive CVA tenderness  over right and left flanks  Skin: Skin is warm and dry.  Cap refill <2 sec.        Assessment:     The patient is a 20 year old male who presents with acute onset of nausea and vomiting. He likely has a viral gastroenteritis due to emesis and acute diarrhea, will test orthostatic blood pressures, not concerning for intense hypovolemia, the patient can tolerate oral apple juice. Told patient to increase fluids, bland diet as tolerated and help treat symptoms at this time.    Plan:     1. Non-intractable vomiting with nausea, unspecified vomiting type - Orthostatic vital signs - ondansetron (ZOFRAN-ODT) disintegrating tablet 4 mg; Take 1 tablet (4 mg total) by mouth once. - ondansetron (ZOFRAN ODT) 4 MG disintegrating tablet; Take 1 tablet (4 mg total) by mouth every 8 (eight) hours as needed for nausea.  Dispense: 20 tablet; Refill: 0  2. Generalized abdominal pain - dicyclomine (BENTYL) 10 MG capsule; Take 1 capsule (10 mg total) by mouth 4 (four) times daily -  before meals and at bedtime.  Dispense: 20 capsule; Refill: 0  3. Diarrhea, unspecified type - POCT CBC - Orthostatic vital signs  4. Polydipsia - POCT glucose (manual entry) - POCT urinalysis dipstick - COMPLETE METABOLIC PANEL WITH GFR

## 2015-04-14 LAB — COMPLETE METABOLIC PANEL WITH GFR
ALT: 38 U/L (ref 9–46)
AST: 24 U/L (ref 10–40)
Albumin: 4.8 g/dL (ref 3.6–5.1)
Alkaline Phosphatase: 65 U/L (ref 40–115)
BUN: 20 mg/dL (ref 7–25)
CO2: 25 mmol/L (ref 20–31)
Calcium: 9.4 mg/dL (ref 8.6–10.3)
Chloride: 102 mmol/L (ref 98–110)
Creat: 0.97 mg/dL (ref 0.60–1.35)
GFR, Est African American: >89 mL/min (ref >=60)
GFR, Est Non African American: >89 mL/min (ref >=60)
Glucose, Bld: 99 mg/dL (ref 65–99)
Potassium: 4 mmol/L (ref 3.5–5.3)
Sodium: 140 mmol/L (ref 135–146)
Total Bilirubin: 0.9 mg/dL (ref 0.2–1.2)
Total Protein: 7.7 g/dL (ref 6.1–8.1)

## 2015-05-25 ENCOUNTER — Ambulatory Visit (INDEPENDENT_AMBULATORY_CARE_PROVIDER_SITE_OTHER): Payer: 59 | Admitting: Family Medicine

## 2015-05-25 ENCOUNTER — Other Ambulatory Visit: Payer: Self-pay | Admitting: Family Medicine

## 2015-05-25 VITALS — BP 122/78 | HR 83 | Temp 99.0°F | Resp 17 | Ht 71.0 in | Wt 225.0 lb

## 2015-05-25 DIAGNOSIS — R21 Rash and other nonspecific skin eruption: Secondary | ICD-10-CM | POA: Diagnosis not present

## 2015-05-25 DIAGNOSIS — N451 Epididymitis: Secondary | ICD-10-CM

## 2015-05-25 MED ORDER — FLUOCINONIDE 0.05 % EX OINT: 1.0000 "application " | TOPICAL_OINTMENT | Freq: Two times a day (BID) | CUTANEOUS | Status: DC

## 2015-05-25 NOTE — Progress Notes (Signed)
Michael Lam is a 20 y.o. male who presents to [facility] today for rash. Patient has had a lifelong rash on his hands and trunk. It tends to get worse and better at times. He uses multiple over-the-counter creams that have helped a little. He notes itching and scaling with occasional cracks and pain.   Additionally he notes a 1-1/2 week history of mild left testicle pain. He denies any penile discharge fevers chills nausea vomiting or diarrhea.   Past Medical History  Diagnosis Date  . Allergy   . Anxiety   . Asthma    Past Surgical History  Procedure Laterality Date  . Tonsillectomy     Social History  Substance Use Topics  . Smoking status: Never Smoker   . Smokeless tobacco: Not on file  . Alcohol Use: No   ROS as above Medications: Current Outpatient Prescriptions  Medication Sig Dispense Refill  . albuterol (PROVENTIL HFA;VENTOLIN HFA) 108 (90 Base) MCG/ACT inhaler Inhale 2 puffs into the lungs every 6 (six) hours as needed for wheezing or shortness of breath (cough, shortness of breath or wheezing.). 1 Inhaler 6  . albuterol (PROVENTIL) (2.5 MG/3ML) 0.083% nebulizer solution Take 2.5 mg by nebulization every 6 (six) hours as needed for wheezing or shortness of breath.    . cetirizine (ZYRTEC) 10 MG tablet Take 1 tablet (10 mg total) by mouth daily. 30 tablet 11  . Dextromethorphan-Guaifenesin (MUCINEX DM) 30-600 MG TB12 Take 1 tablet by mouth 2 (two) times daily. 28 each 0  . dicyclomine (BENTYL) 10 MG capsule Take 1 capsule (10 mg total) by mouth 4 (four) times daily -  before meals and at bedtime. 20 capsule 0  . escitalopram (LEXAPRO) 10 MG tablet Take 1 tablet (10 mg total) by mouth daily. 60 tablet 1  . montelukast (SINGULAIR) 10 MG tablet Take 10 mg by mouth at bedtime.    . ondansetron (ZOFRAN ODT) 4 MG disintegrating tablet Take 1 tablet (4 mg total) by mouth every 8 (eight) hours as needed for nausea. 20 tablet 0  . oseltamivir (TAMIFLU) 75 MG capsule  Take 1 capsule (75 mg total) by mouth 2 (two) times daily. 10 capsule 0  . fluocinonide ointment (LIDEX) 0.05 % Apply 1 application topically 2 (two) times daily. 60 g 6  . LORazepam (ATIVAN) 0.5 MG tablet Take 1 tablet (0.5 mg total) by mouth 2 (two) times daily as needed for anxiety. (Patient not taking: Reported on 05/25/2015) 30 tablet 1   No current facility-administered medications for this visit.   No Known Allergies   Exam:  BP 122/78 mmHg  Pulse 83  Temp(Src) 99 F (37.2 C) (Oral)  Resp 17  Ht 5\' 11"  (1.803 m)  Wt 225 lb (102.059 kg)  BMI 31.39 kg/m2  SpO2 98% Gen: Well NAD HEENT: EOMI,  MMM Lungs: Normal work of breathing. CTABL Heart: RRR no MRG Abd: NABS, Soft. Nondistended, Nontender Exts: Brisk capillary refill, warm and well perfused.  Skin: Erythematous scaling plaques on trunk and extremities consistent with psoriasis. Genitals: No inguinal lymphadenopathy. Testicles descended bilaterally. Left epididymis is mildly tender to touch. No masses palpated. Penis is circumcised with no lesions.   No results found for this or any previous visit (from the past 24 hour(s)). No results found.  Assessment and Plan: 20 y.o. male with   1) psoriasis: Treat with Lidex ointment. Follow-up as needed. 2) epididymitis: Urine GC chlamydia pending. We'll call with results.  Discussed warning signs or symptoms. Please see discharge  instructions. Patient expresses understanding.

## 2015-05-25 NOTE — Patient Instructions (Addendum)
Thank you for coming in today. Use the lidex ointment twice daily until the skin looks normal.  Take ibuprofen for pain.  Return as needed.   I will be happy to continue to see you a primary doctor at the [facility] [place] if needed.  Dr Denyse Amassorey  Address: [address], [place], [place] [address]  Phone: [phone]  Psoriasis Psoriasis is a long-term (chronic) condition of skin inflammation. It occurs because your immune system causes skin cells to form too quickly. As a result, too many skin cells grow and create raised, red patches (plaques) that look silvery on your skin. Plaques may appear anywhere on your body. They can be any size or shape. Psoriasis can come and go. The condition varies from mild to very severe. It cannot be passed from one person to another (not contagious).  CAUSES  The cause of psoriasis is not known, but certain factors can make the condition worse. These include:   Damage or trauma to the skin, such as cuts, scrapes, sunburn, and dryness.  Lack of sunlight.  Certain medicines.  Alcohol.  Tobacco use.  Stress.  Infections caused by bacteria or viruses. RISK FACTORS This condition is more likely to develop in:  People with a family history of psoriasis.  People who are Caucasian.  People who are between the ages of 15-3330 and 3150-736 years old. SYMPTOMS  There are five different types of psoriasis. You can have more than one type of psoriasis during your life. Types are:   Plaque.  Guttate.  Inverse.  Pustular.  Erythrodermic. Each type of psoriasis has different symptoms.   Plaque psoriasis symptoms include red, raised plaques with a silvery white coating (scale). These plaques may be itchy. Your nails may be pitted and crumbly or fall off.  Guttate psoriasis symptoms include small red spots that often show up on your trunk, arms, and legs. These spots may develop after you have been sick, especially with  strep throat.  Inverse psoriasis symptoms include plaques in your underarm area, under your breasts, or on your genitals, groin, or buttocks.  Pustular psoriasis symptoms include pus-filled bumps that are painful, red, and swollen on the palms of your hands or the soles of your feet. You also may feel exhausted, feverish, weak, or have no appetite.  Erythrodermic psoriasis symptoms include bright red skin that may look burned. You may have a fast heartbeat and a body temperature that is too high or too low. You may be itchy or in pain. DIAGNOSIS  Your health care provider may suspect psoriasis based on your symptoms and family history. Your health care provider will also do a physical exam. This may include a procedure to remove a tissue sample (biopsy) for testing. You may also be referred to a health care provider who specializes in skin diseases (dermatologist).  TREATMENT There is no cure for this condition, but treatment can help manage it. Goals of treatment include:   Helping your skin heal.  Reducing itching and inflammation.  Slowing the growth of new skin cells.  Helping your immune system respond better to your skin. Treatment varies, depending on the severity of your condition. Treatment may include:   Creams or ointments.  Ultraviolet ray exposure (light therapy). This may include natural sunlight or light therapy in a medical office.  Medicines (systemic therapy). These medicines can help your body better manage skin cell turnover and inflammation. They may be used along with light therapy or  ointments. You may also get antibiotic medicines if you have an infection. HOME CARE INSTRUCTIONS Skin Care  Moisturize your skin as needed. Only use moisturizers that have been approved by your health care provider.   Apply cool compresses to the affected areas.   Do not scratch your skin.  Lifestyle  Do not use tobacco products. This includes cigarettes, chewing tobacco,  and e-cigarettes. If you need help quitting, ask your health care provider.  Drink little or no alcohol.   Try techniques for stress reduction, such as meditation or yoga.  Get exposure to the sun as told by your health care provider. Do not get sunburned.   Consider joining a psoriasis support group.  Medicines  Take or use over-the-counter and prescription medicines only as told by your health care provider.  If you were prescribed an antibiotic, take or use it as told by your health care provider. Do not stop taking the antibiotic even if your condition starts to improve. General Instructions  Keep a journal to help track what triggers an outbreak. Try to avoid any triggers.   See a counselor or social worker if feelings of sadness, frustration, and hopelessness about your condition are interfering with your work and relationships.  Keep all follow-up visits as told by your health care provider. This is important. SEEK MEDICAL CARE IF:  Your pain gets worse.  You have increasing redness or warmth in the affected areas.   You have new or worsening pain or stiffness in your joints.  Your nails start to break easily or pull away from the nail bed.   You have a fever.   You feel depressed.   This information is not intended to replace advice given to you by your health care provider. Make sure you discuss any questions you have with your health care provider.   Document Released: 01/04/2000 Document Revised: 09/27/2014 Document Reviewed: 05/24/2014 Elsevier Interactive Patient Education 2016 [employer]vier Inc.   Epididymitis Epididymitis is swelling (inflammation) of the epididymis. The epididymis is a cord-like structure that is located along the top and back part of the testicle. It collects and stores sperm from the testicle. This condition can also cause pain and swelling of the testicle and scrotum. Symptoms usually start suddenly (acute epididymitis). Sometimes  epididymitis starts gradually and lasts for a while (chronic epididymitis). This type may be harder to treat. CAUSES In men 22 and younger, this condition is usually caused by a bacterial infection or sexually transmitted disease (STD), such as:  Gonorrhea.  Chlamydia.  In men 72 and older who do not have anal sex, this condition is usually caused by bacteria from a blockage or abnormalities in the urinary system. These can result from:  Having a tube placed into the bladder (urinary catheter).  Having an enlarged or inflamed prostate gland.  Having recent urinary tract surgery. In men who have a condition that weakens the body's defense system (immune system), such as HIV, this condition can be caused by:   Other bacteria, including tuberculosis and syphilis.  Viruses.  Fungi. Sometimes this condition occurs without infection. That may happen if urine flows backward into the epididymis after heavy lifting or straining. RISK FACTORS This condition is more likely to develop in men:  Who have unprotected sex with more than one partner.  Who have anal sex.   Who have recently had surgery.   Who have a urinary catheter.  Who have urinary problems.  Who have a suppressed immune system. SYMPTOMS  This condition usually begins suddenly with chills, fever, and pain behind the scrotum and in the testicle. Other symptoms include:   Swelling of the scrotum, testicle, or both.  Pain whenejaculatingor urinating.  Pain in the back or belly.  Nausea.  Itching and discharge from the penis.  Frequent need to pass urine.  Redness and tenderness of the scrotum. DIAGNOSIS Your health care provider can diagnose this condition based on your symptoms and medical history. Your health care provider will also do a physical exam to ask about your symptoms and check your scrotum and testicle for swelling, pain, and redness. You may also have other tests, including:   Examination of  discharge from the penis.  Urine tests for infections, such as STDs.  Your health care provider may test you for other STDs, including HIV. TREATMENT Treatment for this condition depends on the cause. If your condition is caused by a bacterial infection, oral antibiotic medicine may be prescribed. If the bacterial infection has spread to your blood, you may need to receive IV antibiotics. Nonbacterial epididymitis is treated with home care that includes bed rest and elevation of the scrotum. Surgery may be needed to treat:  Bacterial epididymitis that causes pus to build up in the scrotum (abscess).  Chronic epididymitis that has not responded to other treatments. HOME CARE INSTRUCTIONS Medicines  Take over-the-counter and prescription medicines only as told by your health care provider.   If you were prescribed an antibiotic medicine, take it as told by your health care provider. Do not stop taking the antibiotic even if your condition improves. Sexual Activity  If your epididymitis was caused by an STD, avoid sexual activity until your treatment is complete.  Inform your sexual partner or partners if you test positive for an STD. They may need to be treated.Do not engage in sexual activity with your partner or partners until their treatment is completed. General Instructions  Return to your normal activities as told by your health care provider. Ask your health care provider what activities are safe for you.  Keep your scrotum elevated and supported while resting. Ask your health care provider if you should wear a scrotal support, such as a jockstrap. Wear it as told by your health care provider.  If directed, apply ice to the affected area:   Put ice in a plastic bag.  Place a towel between your skin and the bag.  Leave the ice on for 20 minutes, 2-3 times per day.  Try taking a sitz bath to help with discomfort. This is a warm water bath that is taken while you are  sitting down. The water should only come up to your hips and should cover your buttocks. Do this 3-4 times per day or as told by your health care provider.  Keep all follow-up visits as told by your health care provider. This is important. SEEK MEDICAL CARE IF:   You have a fever.   Your pain medicine is not helping.   Your pain is getting worse.   Your symptoms do not improve within three days.   This information is not intended to replace advice given to you by your health care provider. Make sure you discuss any questions you have with your health care provider.   Document Released: 01/04/2000 Document Revised: 09/27/2014 Document Reviewed: 05/24/2014 Elsevier Interactive Patient Education 2016 [employer].      IF you received an x-ray today, you will receive an invoice from [facility]. Please contact [facility]  Radiology at [phone] with questions or concerns regarding your invoice.   IF you received labwork today, you will receive an invoice from [employer]. Please contact [facility] at [phone] with questions or concerns regarding your invoice.   Our billing staff will not be able to assist you with questions regarding bills from these companies.  You will be contacted with the lab results as soon as they are available. The fastest way to get your results is to activate your My Chart account. Instructions are located on the last page of this paperwork. If you have not heard from [place] regarding the results in 2 weeks, please contact this office.

## 2015-05-26 LAB — GC/CHLAMYDIA PROBE AMP
CT Probe RNA: NOT DETECTED
GC Probe RNA: NOT DETECTED

## 2015-08-31 ENCOUNTER — Ambulatory Visit (INDEPENDENT_AMBULATORY_CARE_PROVIDER_SITE_OTHER): Payer: 59 | Admitting: Osteopathic Medicine

## 2015-08-31 VITALS — BP 137/84 | HR 87 | Temp 98.2°F | Resp 16 | Ht 71.0 in | Wt 235.0 lb

## 2015-08-31 DIAGNOSIS — M778 Other enthesopathies, not elsewhere classified: Secondary | ICD-10-CM

## 2015-08-31 DIAGNOSIS — L0501 Pilonidal cyst with abscess: Secondary | ICD-10-CM

## 2015-08-31 DIAGNOSIS — H5213 Myopia, bilateral: Secondary | ICD-10-CM | POA: Diagnosis not present
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
## 2015-08-31 NOTE — Patient Instructions (Addendum)
IF you received an x-ray today, you will receive an invoice from [facility]. Please contact [facility] at [phone] with questions or concerns regarding your invoice.   IF you received labwork today, you will receive an invoice from [employer]. Please contact [facility] at [phone] with questions or concerns regarding your invoice.   Our billing staff will not be able to assist you with questions regarding bills from these companies.  You will be contacted with the lab results as soon as they are available. The fastest way to get your results is to activate your My Chart account. Instructions are located on the last page of this paperwork. If you have not heard from [place] regarding the results in 2 weeks, please contact this office.     Pilonidal Cyst A pilonidal cyst is a fluid-filled sac. It forms beneath the skin near your tailbone, at the top of the crease of your buttocks. A pilonidal cyst that is not large or infected may not cause symptoms or problems. If the cyst becomes irritated or infected, it may fill with pus. This causes pain and swelling (pilonidal abscess). An infected cyst may need to be treated with medicine, drained, or removed. CAUSES The cause of a pilonidal cyst is not known. One cause may be a hair that grows into your skin (ingrown hair). RISK FACTORS Pilonidal cysts are more common in boys and men. Risk factors include:  Having lots of hair near the crease of the buttocks.  Being overweight.  Having a pilonidal dimple.  Wearing tight clothing.  Not bathing or showering frequently.  Sitting for long periods of time. SIGNS AND SYMPTOMS Signs and symptoms of a pilonidal cyst may include:  Redness.  Pain and tenderness.  Warmth.  Swelling.  Pus.  Fever. DIAGNOSIS Your health care provider may diagnose a pilonidal cyst based on your symptoms and a physical exam. The health care provider may do a  blood test to check for infection. If your cyst is draining pus, your health care provider may take a sample of the drainage to be tested at a laboratory. TREATMENT Surgery is the usual treatment for an infected pilonidal cyst. You may also have to take medicines before surgery. The type of surgery you have depends on the size and severity of the infected cyst. The different kinds of surgery include:  Incision and drainage. This is a procedure to open and drain the cyst.  Marsupialization. In this procedure, a large cyst or abscess may be opened and kept open by stitching the edges of the skin to the cyst walls.  Cyst removal. This procedure involves opening the skin and removing all or part of the cyst. HOME CARE INSTRUCTIONS  Follow all of your surgeon's instructions carefully if you had surgery.  Take medicines only as directed by your health care provider.  If you were prescribed an antibiotic medicine, finish it all even if you start to feel better.  Keep the area around your pilonidal cyst clean and dry.  Clean the area as directed by your health care provider. Pat the area dry with a clean towel. Do not rub it as this may cause bleeding.  Remove hair from the area around the cyst as directed by your health care provider.  Do not wear tight clothing or sit in one place for long periods of time.  There are many different ways to close and cover an incision, including stitches, skin glue, and adhesive strips. Follow  your health care provider's instructions on:  Incision care.  Bandage (dressing) changes and removal.  Incision closure removal. SEEK MEDICAL CARE IF:   You have bad-smelling drainage, redness, swelling, or pain at the site of the cyst.  You have a fever.   This information is not intended to replace advice given to you by your health care provider. Make sure you discuss any questions you have with your health care provider.   Document Released: 01/04/2000  Document Revised: 01/27/2014 Document Reviewed: 05/26/2013 Elsevier Interactive Patient Education [employer]2016 Elsevier Inc.

## 2015-09-01 DIAGNOSIS — L0501 Pilonidal cyst with abscess: Secondary | ICD-10-CM | POA: Insufficient documentation

## 2015-09-03 ENCOUNTER — Emergency Department (HOSPITAL_COMMUNITY)
Admission: EM | Admit: 2015-09-03 | Discharge: 2015-09-03 | Disposition: A | Discharge status: Home / Self Care | Payer: 59 | Attending: Emergency Medicine | Admitting: Emergency Medicine

## 2015-09-03 ENCOUNTER — Encounter (HOSPITAL_COMMUNITY): Payer: Self-pay | Admitting: Emergency Medicine

## 2015-09-03 DIAGNOSIS — J45909 Unspecified asthma, uncomplicated: Secondary | ICD-10-CM | POA: Diagnosis not present

## 2015-09-03 DIAGNOSIS — L0291 Cutaneous abscess, unspecified: Secondary | ICD-10-CM

## 2015-09-03 DIAGNOSIS — L0231 Cutaneous abscess of buttock: Secondary | ICD-10-CM | POA: Insufficient documentation

## 2015-09-03 DIAGNOSIS — L0390 Cellulitis, unspecified: Secondary | ICD-10-CM

## 2015-09-03 DIAGNOSIS — L02212 Cutaneous abscess of back [any part, except buttock]: Secondary | ICD-10-CM | POA: Diagnosis not present

## 2015-09-03 MED ORDER — IBUPROFEN 800 MG PO TABS: 800.0000 mg | ORAL_TABLET | Freq: Three times a day (TID) | ORAL | 0 refills | Status: DC

## 2015-09-03 MED ORDER — CEPHALEXIN 500 MG PO CAPS: 500.0000 mg | ORAL_CAPSULE | Freq: Four times a day (QID) | ORAL | 0 refills | Status: DC

## 2015-09-03 MED ORDER — LIDOCAINE HCL (PF) 1 % IJ SOLN
5.0000 mL | Freq: Once | INTRAMUSCULAR | Status: AC
  Administered 2015-09-03: 5 mL via INTRADERMAL
  Filled 2015-09-03: qty 5

## 2015-09-03 MED ORDER — HYDROCODONE-ACETAMINOPHEN 5-325 MG PO TABS
1.0000 | ORAL_TABLET | Freq: Once | ORAL | Status: AC
  Administered 2015-09-03: 1 via ORAL
  Filled 2015-09-03: qty 1

## 2015-09-03 MED ORDER — CEPHALEXIN 250 MG PO CAPS
500.0000 mg | ORAL_CAPSULE | Freq: Once | ORAL | Status: AC
  Administered 2015-09-03: 500 mg via ORAL
  Filled 2015-09-03: qty 2

## 2015-09-03 NOTE — ED Provider Notes (Signed)
[facility]-EMERGENCY DEPT Provider Note   CSN: [id number] Arrival date & time: 09/03/15  [id number]  By signing my name below, I, Christy SartoriusAnastasia Kolousek, attest that this documentation has been prepared under the direction and in the presence of  Cheri FowlerKayla Jabin Tapp, PA-C. Electronically Signed: Christy SartoriusAnastasia Kolousek, ED Scribe. 09/03/15. 9:31 PM.   History   Chief Complaint Chief Complaint  Patient presents with  . Abscess   The history is provided by the patient. No language interpreter was used.    HPI Comments:  Michael Lam is a 20 y.o. male who presents to the Emergency Department complaining of increased pain and drainage s/p I&D to his gluteal fold 4 days ago.  He notes associated subjective fever.  Pt was at urgent care on 08/31/15 to have a cyst drained, but continues to see mixed purulent and bloody drainage after that.  His cyst was cultured and grew out strep.  He denies chills, nausea, and vomiting.    Past Medical History:  Diagnosis Date  . Allergy   . Anxiety   . Asthma     Patient Active Problem List   Diagnosis Date Noted  . Pilonidal cyst with abscess 09/01/2015  . Rash and nonspecific skin eruption 05/25/2015  . Epididymitis 05/25/2015  . Anxiety 04/13/2015    Past Surgical History:  Procedure Laterality Date  . TONSILLECTOMY         Home Medications    Prior to Admission medications   Medication Sig Start Date End Date Taking? Authorizing Provider  albuterol (PROVENTIL HFA;VENTOLIN HFA) 108 (90 Base) MCG/ACT inhaler Inhale 2 puffs into the lungs every 6 (six) hours as needed for wheezing or shortness of breath (cough, shortness of breath or wheezing.). 02/15/15   Wallis BambergMario Mani, PA-C  albuterol (PROVENTIL) (2.5 MG/3ML) 0.083% nebulizer solution Take 2.5 mg by nebulization every 6 (six) hours as needed for wheezing or shortness of breath.    Historical Provider, MD  cephALEXin (KEFLEX) 500 MG capsule Take 1 capsule (500 mg total) by mouth 4 (four) times daily.  09/03/15   Cheri FowlerKayla Zyana Amaro, PA-C  cetirizine (ZYRTEC) 10 MG tablet Take 1 tablet (10 mg total) by mouth daily. 02/15/15   Wallis BambergMario Mani, PA-C  fluocinonide ointment (LIDEX) 0.05 % Apply 1 application topically 2 (two) times daily. 05/25/15   Rodolph BongEvan S Corey, MD  ibuprofen (ADVIL,MOTRIN) 800 MG tablet Take 1 tablet (800 mg total) by mouth 3 (three) times daily. 09/03/15   Cheri FowlerKayla Yomira Flitton, PA-C    Family History Family History  Problem Relation Age of Onset  . Hyperlipidemia Father   . Hypertension Father   . Diabetes Father     Social History Social History  Substance Use Topics  . Smoking status: Never Smoker  . Smokeless tobacco: Never Used  . Alcohol use No     Allergies   Review of patient's allergies indicates no known allergies.   Review of Systems Review of Systems  Constitutional: Positive for fever (subjective). Negative for chills.  Gastrointestinal: Negative for nausea and vomiting.  Skin: Positive for wound.     Physical Exam Updated Vital Signs There were no vitals taken for this visit.  Physical Exam  Constitutional: He is oriented to person, place, and time. He appears well-developed and well-nourished.  HENT:  Head: Normocephalic and atraumatic.  Right Ear: External ear normal.  Left Ear: External ear normal.  Eyes: Conjunctivae are normal. No scleral icterus.  Neck: No tracheal deviation present.  Pulmonary/Chest: Effort normal. No respiratory distress.  Abdominal: He exhibits  no distension.  Musculoskeletal: Normal range of motion.  Neurological: He is alert and oriented to person, place, and time.  Skin: Skin is warm and dry.  1x1 cm area of induration with mild erythema on left upper buttock.  No drainage with palpation.  No fluctuance.  Chaperone (scribe) was present for exam which was performed with no discomfort or complications.   Psychiatric: He has a normal mood and affect. His behavior is normal.     ED Treatments / Results   DIAGNOSTIC  STUDIES:  Oxygen Saturation is 97% on room air, normal by my interpretation.    COORDINATION OF CARE:  9:31 PM Discussed treatment plan with pt at bedside and pt agreed to plan.  Labs (all labs ordered are listed, but only abnormal results are displayed) Labs Reviewed - No data to display  EKG  EKG Interpretation None       Radiology No results found.  Procedures Procedures   INCISION AND DRAINAGE Performed by: Cheri FowlerKayla Emanuele Mcwhirter PA-C Consent: Verbal consent obtained. Risks and benefits: risks, benefits and alternatives were discussed Type: abscess  Body area: left glute   Anesthesia: local infiltration  Incision was made with a scalpel.  Local anesthetic: lidocaine 1% without epinephrine  Anesthetic total: 5 ml  Complexity: complex Blunt dissection to break up loculations  Drainage: purulent + bloody  Drainage amount: mild  Packing material: none  Patient tolerance: Patient tolerated the procedure well with no immediate complications.     Medications Ordered in ED Medications  lidocaine (PF) (XYLOCAINE) 1 % injection 5 mL (5 mLs Intradermal Given 09/03/15 2141)  HYDROcodone-acetaminophen (NORCO/VICODIN) 5-325 MG per tablet 1 tablet (1 tablet Oral Given 09/03/15 2140)     Initial Impression / Assessment and Plan / ED Course  I have reviewed the triage vital signs and the nursing notes.  Pertinent labs & imaging results that were available during my care of the patient were reviewed by me and considered in my medical decision making (see chart for details).  Clinical Course   Patient with skin abscess. Incision and drainage performed in the ED today.  Abscess was not large enough to warrant packing or drain placement. Wound recheck in 2 days. Supportive care and return precautions discussed.  Pt sent home with Keflex, culture showed gram positive strep. The patient appears reasonably screened and/or stabilized for discharge and I doubt any other emergent  medical condition requiring further screening, evaluation, or treatment in the ED prior to discharge.   Final Clinical Impressions(s) / ED Diagnoses   Final diagnoses:  Abscess and cellulitis    New Prescriptions New Prescriptions   CEPHALEXIN (KEFLEX) 500 MG CAPSULE    Take 1 capsule (500 mg total) by mouth 4 (four) times daily.   IBUPROFEN (ADVIL,MOTRIN) 800 MG TABLET    Take 1 tablet (800 mg total) by mouth 3 (three) times daily.   I personally performed the services described in this documentation, which was scribed in my presence. The recorded information has been reviewed and is accurate.     Cheri FowlerKayla Nashae Maudlin, PA-C 09/03/15 2214    Lavera Guiseana Duo Liu, MD 09/04/15 1329

## 2015-09-03 NOTE — ED Triage Notes (Signed)
Pilonodal cyst lanced at [facility] on Friday. Today bleeding and more painful.

## 2015-09-03 NOTE — ED Notes (Signed)
SMALL ABSCESS PRESENT BETWEEN BUTTOCKS AT TOP. AREA REDDENED WITH SOME DRAINAGE

## 2015-09-04 ENCOUNTER — Ambulatory Visit: Payer: Self-pay

## 2015-09-04 LAB — WOUND CULTURE: Gram Stain: NONE SEEN

## 2015-10-03 ENCOUNTER — Ambulatory Visit: Payer: Self-pay

## 2015-10-05 ENCOUNTER — Ambulatory Visit (INDEPENDENT_AMBULATORY_CARE_PROVIDER_SITE_OTHER): Payer: 59 | Admitting: Urgent Care

## 2015-10-05 VITALS — BP 110/80 | HR 91 | Temp 98.6°F | Resp 16 | Ht 71.0 in | Wt 239.0 lb

## 2015-10-05 DIAGNOSIS — Z8639 Personal history of other endocrine, nutritional and metabolic disease: Secondary | ICD-10-CM

## 2015-10-05 DIAGNOSIS — R42 Dizziness and giddiness: Secondary | ICD-10-CM | POA: Diagnosis not present

## 2015-10-05 DIAGNOSIS — Z832 Family history of diseases of the blood and blood-forming organs and certain disorders involving the immune mechanism: Secondary | ICD-10-CM

## 2015-10-05 DIAGNOSIS — R0789 Other chest pain: Secondary | ICD-10-CM

## 2015-10-05 DIAGNOSIS — R5383 Other fatigue: Secondary | ICD-10-CM | POA: Diagnosis not present

## 2015-10-05 DIAGNOSIS — G4700 Insomnia, unspecified: Secondary | ICD-10-CM

## 2015-10-05 DIAGNOSIS — E669 Obesity, unspecified: Secondary | ICD-10-CM

## 2015-10-05 LAB — CBC
HCT: 42.3 % (ref 38.5–50.0)
Hemoglobin: 14.2 g/dL (ref 13.2–17.1)
MCH: 27.6 pg (ref 27.0–33.0)
MCHC: 33.6 g/dL (ref 32.0–36.0)
MCV: 82.1 fL (ref 80.0–100.0)
MPV: 10.4 fL (ref 7.5–12.5)
Platelets: 159 10*3/uL (ref 140–400)
RBC: 5.15 MIL/uL (ref 4.20–5.80)
RDW: 13.3 % (ref 11.0–15.0)
WBC: 5.5 10*3/uL (ref 3.8–10.8)

## 2015-10-05 LAB — TSH: TSH: 2.17 mIU/L (ref 0.40–4.50)

## 2015-10-05 MED ORDER — HYDROXYZINE PAMOATE 50 MG PO CAPS: 50.0000 mg | ORAL_CAPSULE | Freq: Every evening | ORAL | 5 refills | Status: DC | PRN

## 2015-10-05 NOTE — Patient Instructions (Addendum)
Escitalopram tablets What is this medicine? ESCITALOPRAM (es sye TAL oh pram) is used to treat depression and certain types of anxiety. This medicine may be used for other purposes; ask your health care provider or pharmacist if you have questions. What should I tell my health care provider before I take this medicine? They need to know if you have any of these conditions: -bipolar disorder or a family history of bipolar disorder -diabetes -glaucoma -heart disease -kidney or liver disease -receiving electroconvulsive therapy -seizures (convulsions) -suicidal thoughts, plans, or attempt by you or a family member -an unusual or allergic reaction to escitalopram, the related drug citalopram, other medicines, foods, dyes, or preservatives -pregnant or trying to become pregnant -breast-feeding How should I use this medicine? Take this medicine by mouth with a glass of water. Follow the directions on the prescription label. You can take it with or without food. If it upsets your stomach, take it with food. Take your medicine at regular intervals. Do not take it more often than directed. Do not stop taking this medicine suddenly except upon the advice of your doctor. Stopping this medicine too quickly may cause serious side effects or your condition may worsen. A special MedGuide will be given to you by the pharmacist with each prescription and refill. Be sure to read this information carefully each time. Talk to your pediatrician regarding the use of this medicine in children. Special care may be needed. Overdosage: If you think you have taken too much of this medicine contact a poison control center or emergency room at once. NOTE: This medicine is only for you. Do not share this medicine with others. What if I miss a dose? If you miss a dose, take it as soon as you can. If it is almost time for your next dose, take only that dose. Do not take double or extra doses. What may interact with this  medicine? Do not take this medicine with any of the following medications: -certain medicines for fungal infections like fluconazole, itraconazole, ketoconazole, posaconazole, voriconazole -cisapride -citalopram -dofetilide -dronedarone -linezolid -MAOIs like Carbex, Eldepryl, Marplan, Nardil, and Parnate -methylene blue (injected into a vein) -pimozide -thioridazine -ziprasidone This medicine may also interact with the following medications: -alcohol -aspirin and aspirin-like medicines -carbamazepine -certain medicines for depression, anxiety, or psychotic disturbances -certain medicines for migraine headache like almotriptan, eletriptan, frovatriptan, naratriptan, rizatriptan, sumatriptan, zolmitriptan -certain medicines for sleep -certain medicines that treat or prevent blood clots like warfarin, enoxaparin, dalteparin -cimetidine -diuretics -fentanyl -furazolidone -isoniazid -lithium -metoprolol -NSAIDs, medicines for pain and inflammation, like ibuprofen or naproxen -other medicines that prolong the QT interval (cause an abnormal heart rhythm) -procarbazine -rasagiline -supplements like St. John's wort, kava kava, valerian -tramadol -tryptophan This list may not describe all possible interactions. Give your health care provider a list of all the medicines, herbs, non-prescription drugs, or dietary supplements you use. Also tell them if you smoke, drink alcohol, or use illegal drugs. Some items may interact with your medicine. What should I watch for while using this medicine? Tell your doctor if your symptoms do not get better or if they get worse. Visit your doctor or health care professional for regular checks on your progress. Because it may take several weeks to see the full effects of this medicine, it is important to continue your treatment as prescribed by your doctor. Patients and their families should watch out for new or worsening thoughts of suicide or  depression. Also watch out for sudden changes in feelings such  as feeling anxious, agitated, panicky, irritable, hostile, aggressive, impulsive, severely restless, overly excited and hyperactive, or not being able to sleep. If this happens, especially at the beginning of treatment or after a change in dose, call your health care professional. You may get drowsy or dizzy. Do not drive, use machinery, or do anything that needs mental alertness until you know how this medicine affects you. Do not stand or sit up quickly, especially if you are an older patient. This reduces the risk of dizzy or fainting spells. Alcohol may interfere with the effect of this medicine. Avoid alcoholic drinks. Your mouth may get dry. Chewing sugarless gum or sucking hard candy, and drinking plenty of water may help. Contact your doctor if the problem does not go away or is severe. What side effects may I notice from receiving this medicine? Side effects that you should report to your doctor or health care professional as soon as possible: -allergic reactions like skin rash, itching or hives, swelling of the face, lips, or tongue -confusion -feeling faint or lightheaded, falls -fast talking and excited feelings or actions that are out of control -hallucination, loss of contact with reality -seizures -suicidal thoughts or other mood changes -unusual bleeding or bruising Side effects that usually do not require medical attention (report to your doctor or health care professional if they continue or are bothersome): -blurred vision -changes in appetite -change in sex drive or performance -headache -increased sweating -nausea This list may not describe all possible side effects. Call your doctor for medical advice about side effects. You may report side effects to FDA at 1-800-FDA-1088. Where should I keep my medicine? Keep out of reach of children. Store at room temperature between 15 and 30 degrees C (59 and 86 degrees  F). Throw away any unused medicine after the expiration date. NOTE: This sheet is a summary. It may not cover all possible information. If you have questions about this medicine, talk to your doctor, pharmacist, or health care provider.    2016, Elsevier/Gold Standard. (2012-08-03 12:32:55)     IF you received an x-ray today, you will receive an invoice from [facility]. Please contact [facility] at [phone] with questions or concerns regarding your invoice.   IF you received labwork today, you will receive an invoice from [facility]. Please contact [facility] at [phone] with questions or concerns regarding your invoice.   Our billing staff will not be able to assist you with questions regarding bills from these companies.  You will be contacted with the lab results as soon as they are available. The fastest way to get your results is to activate your My Chart account. Instructions are located on the last page of this paperwork. If you have not heard from us regarding the results in 2 weeks, please contact this office.      

## 2015-10-05 NOTE — Addendum Note (Signed)
Addended by: Wallis BambergMANI, Arlyne Brandes on: 10/05/2015 04:35 PM   Modules accepted: Orders

## 2015-10-05 NOTE — Progress Notes (Signed)
    MRN: [id number] DOB: 07/10/1995  Subjective:   Michael Lam is a 20 y.o. male presenting for chief complaint of Fatigue (x 2 weeks) and Headache  Reports 2 week history of fatigue, weakness. Has also had insomnia, has difficulty falling asleep. Feels like he's moving more slowly mentally and physically. Patient also reports chest pain at night when he's trying to go to sleep, dizziness throughout the day, breathes heavily but has not felt shob. He is a full time student at [employer], was participating in band but had to quit due to his time restraints. Patient eats regular meals, hydrates well, does not exercise. Patient was last seen here by me on 02/15/2015, started treatment for anxiety with escitalopram but only took this for 3 weeks. Still has stressors with school, also has a job as a Information systems managertransporter. Has not really established a good support network with friends. He still feels home sick since moving from [place]. Denies feelings of depressed mood, irritability, thoughts about death.   Michael Lam has a current medication list which includes the following prescription(s): albuterol, albuterol, cephalexin, cetirizine, fluocinonide ointment, and ibuprofen. Also has No Known Allergies.  Michael Lam  has a past medical history of Allergy; Anxiety; and Asthma. Also  has a past surgical history that includes Tonsillectomy.  Objective:   Vitals: BP 110/80 (BP Location: Right Arm, Patient Position: Sitting, Cuff Size: Normal)   Pulse 91   Temp 98.6 F (37 C) (Oral)   Resp 16   Ht 5\' 11"  (1.803 m)   Wt 239 lb (108.4 kg)   SpO2 97%   BMI 33.33 kg/m   Wt Readings from Last 3 Encounters:  10/05/15 239 lb (108.4 kg)  08/31/15 235 lb (106.6 kg)  05/25/15 225 lb (102.1 kg)    Physical Exam  Constitutional: He is oriented to person, place, and time. He appears well-developed and well-nourished.  HENT:  Mouth/Throat: Oropharynx is clear and moist.  Eyes: No scleral  icterus.  Neck: No thyromegaly present.  Cardiovascular: Normal rate, regular rhythm and intact distal pulses.  Exam reveals no gallop and no friction rub.   No murmur heard. Pulmonary/Chest: No respiratory distress. He has no wheezes. He has no rales.  Abdominal: Soft. Bowel sounds are normal. He exhibits no distension and no mass. There is no tenderness.  Neurological: He is alert and oriented to person, place, and time.  Skin: Skin is warm and dry.   ECG interpretation - normal sinus rhythm.  Assessment and Plan :   1. Other fatigue 2. Atypical chest pain 3. Obesity 4. Dizziness - Labs pending, I suspect that his physical symptoms are largely anxiety related with some components of depression. I reviwed stress management with patient, meditation for sleep and will offer script for Vistaril as needed. I discussed treatment at length with the patient. He is to restart escitalopram. Follow up in 8 weeks.  5. History of hypoglycemia - Advised patient to eat 3 regularly balanced meals, no skipping meals. Patient agreed.  6. Family history of thalassemia - Labs pending  Wallis BambergMario Sherrine Salberg, PA-C [facility] [phone] 10/05/2015 3:38 PM

## 2015-10-06 LAB — HEMOGLOBIN A1C
Hgb A1c MFr Bld: 5.2 % (ref <5.7)
Mean Plasma Glucose: 103 mg/dL

## 2015-10-22 DIAGNOSIS — Z0120 Encounter for dental examination and cleaning without abnormal findings: Secondary | ICD-10-CM | POA: Diagnosis not present

## 2016-11-19 ENCOUNTER — Encounter: Payer: 59 | Admitting: Emergency Medicine

## 2016-11-20 ENCOUNTER — Ambulatory Visit (INDEPENDENT_AMBULATORY_CARE_PROVIDER_SITE_OTHER): Payer: BLUE CROSS/BLUE SHIELD

## 2016-11-20 ENCOUNTER — Ambulatory Visit (INDEPENDENT_AMBULATORY_CARE_PROVIDER_SITE_OTHER): Payer: BLUE CROSS/BLUE SHIELD | Admitting: Emergency Medicine

## 2016-11-20 ENCOUNTER — Encounter: Payer: Self-pay | Admitting: Emergency Medicine

## 2016-11-20 VITALS — BP 105/67 | HR 70 | Temp 98.4°F | Resp 16 | Ht 70.0 in | Wt 237.0 lb

## 2016-11-20 DIAGNOSIS — F411 Generalized anxiety disorder: Secondary | ICD-10-CM | POA: Diagnosis not present

## 2016-11-20 DIAGNOSIS — R0789 Other chest pain: Secondary | ICD-10-CM

## 2016-11-20 DIAGNOSIS — Z23 Encounter for immunization: Secondary | ICD-10-CM

## 2016-11-20 DIAGNOSIS — Z0000 Encounter for general adult medical examination without abnormal findings: Secondary | ICD-10-CM

## 2016-11-20 NOTE — Patient Instructions (Addendum)
   IF you received an x-ray today, you will receive an invoice from [facility]. Please contact [facility] at [phone] with questions or concerns regarding your invoice.   IF you received labwork today, you will receive an invoice from [facility]. Please contact [facility] at [phone] with questions or concerns regarding your invoice.   Our billing staff will not be able to assist you with questions regarding bills from these companies.  You will be contacted with the lab results as soon as they are available. The fastest way to get your results is to activate your My Chart account. Instructions are located on the last page of this paperwork. If you have not heard from us regarding the results in 2 weeks, please contact this office.      Health Maintenance, Male A healthy lifestyle and preventive care is important for your health and wellness. Ask your health care provider about what schedule of regular examinations is right for you. What should I know about weight and diet? Eat a Healthy Diet  Eat plenty of vegetables, fruits, whole grains, low-fat dairy products, and lean protein.  Do not eat a lot of foods high in solid fats, added sugars, or salt.  Maintain a Healthy Weight Regular exercise can help you achieve or maintain a healthy weight. You should:  Do at least 150 minutes of exercise each week. The exercise should increase your heart rate and make you sweat (moderate-intensity exercise).  Do strength-training exercises at least twice a week.  Watch Your Levels of Cholesterol and Blood Lipids  Have your blood tested for lipids and cholesterol every 5 years starting at 22 years of age. If you are at high risk for heart disease, you should start having your blood tested when you are 22 years old. You may need to have your cholesterol levels checked more often if: ? Your lipid or cholesterol levels are high. ? You are older than 22 years of age. ? You  are at high risk for heart disease.  What should I know about cancer screening? Many types of cancers can be detected early and may often be prevented. Lung Cancer  You should be screened every year for lung cancer if: ? You are a current smoker who has smoked for at least 30 years. ? You are a former smoker who has quit within the past 15 years.  Talk to your health care provider about your screening options, when you should start screening, and how often you should be screened.  Colorectal Cancer  Routine colorectal cancer screening usually begins at 22 years of age and should be repeated every 5-10 years until you are 22 years old. You may need to be screened more often if early forms of precancerous polyps or small growths are found. Your health care provider may recommend screening at an earlier age if you have risk factors for colon cancer.  Your health care provider may recommend using home test kits to check for hidden blood in the stool.  A small camera at the end of a tube can be used to examine your colon (sigmoidoscopy or colonoscopy). This checks for the earliest forms of colorectal cancer.  Prostate and Testicular Cancer  Depending on your age and overall health, your health care provider may do certain tests to screen for prostate and testicular cancer.  Talk to your health care provider about any symptoms or concerns you have about testicular or prostate cancer.  Skin Cancer  Check your skin   from head to toe regularly.  Tell your health care provider about any new moles or changes in moles, especially if: ? There is a change in a mole's size, shape, or color. ? You have a mole that is larger than a pencil eraser.  Always use sunscreen. Apply sunscreen liberally and repeat throughout the day.  Protect yourself by wearing long sleeves, pants, a wide-brimmed hat, and sunglasses when outside.  What should I know about heart disease, diabetes, and high blood  pressure?  If you are 18-39 years of age, have your blood pressure checked every 3-5 years. If you are 40 years of age or older, have your blood pressure checked every year. You should have your blood pressure measured twice-once when you are at a hospital or clinic, and once when you are not at a hospital or clinic. Record the average of the two measurements. To check your blood pressure when you are not at a hospital or clinic, you can use: ? An automated blood pressure machine at a pharmacy. ? A home blood pressure monitor.  Talk to your health care provider about your target blood pressure.  If you are between 45-79 years old, ask your health care provider if you should take aspirin to prevent heart disease.  Have regular diabetes screenings by checking your fasting blood sugar level. ? If you are at a normal weight and have a low risk for diabetes, have this test once every three years after the age of 45. ? If you are overweight and have a high risk for diabetes, consider being tested at a younger age or more often.  A one-time screening for abdominal aortic aneurysm (AAA) by ultrasound is recommended for men aged 65-75 years who are current or former smokers. What should I know about preventing infection? Hepatitis B If you have a higher risk for hepatitis B, you should be screened for this virus. Talk with your health care provider to find out if you are at risk for hepatitis B infection. Hepatitis C Blood testing is recommended for:  Everyone born from 1945 through 1965.  Anyone with known risk factors for hepatitis C.  Sexually Transmitted Diseases (STDs)  You should be screened each year for STDs including gonorrhea and chlamydia if: ? You are sexually active and are younger than 22 years of age. ? You are older than 22 years of age and your health care provider tells you that you are at risk for this type of infection. ? Your sexual activity has changed since you were last  screened and you are at an increased risk for chlamydia or gonorrhea. Ask your health care provider if you are at risk.  Talk with your health care provider about whether you are at high risk of being infected with HIV. Your health care provider may recommend a prescription medicine to help prevent HIV infection.  What else can I do?  Schedule regular health, dental, and eye exams.  Stay current with your vaccines (immunizations).  Do not use any tobacco products, such as cigarettes, chewing tobacco, and e-cigarettes. If you need help quitting, ask your health care provider.  Limit alcohol intake to no more than 2 drinks per day. One drink equals 12 ounces of beer, 5 ounces of wine, or 1 ounces of hard liquor.  Do not use street drugs.  Do not share needles.  Ask your health care provider for help if you need support or information about quitting drugs.  Tell your health care   provider if you often feel depressed.  Tell your health care provider if you have ever been abused or do not feel safe at home. This information is not intended to replace advice given to you by your health care provider. Make sure you discuss any questions you have with your health care provider. Document Released: 07/05/2007 Document Revised: 09/05/2015 Document Reviewed: 10/10/2014 [employer] Interactive Patient Education  2018 [employer]  American Heart Association (AHA) Exercise Recommendation  Being physically active is important to prevent heart disease and stroke, the nation's No. 1and No. 5killers. To improve overall cardiovascular health, we suggest at least 150 minutes per week of moderate exercise or 75 minutes per week of vigorous exercise (or a combination of moderate and vigorous activity). Thirty minutes a day, five times a week is an easy goal to remember. You will also experience benefits even if you divide your time into two or three segments of 10 to 15 minutes per day.  For people who would  benefit from lowering their blood pressure or cholesterol, we recommend 40 minutes of aerobic exercise of moderate to vigorous intensity three to four times a week to lower the risk for heart attack and stroke.  Physical activity is anything that makes you move your body and burn calories.  This includes things like climbing stairs or playing sports. Aerobic exercises benefit your heart, and include walking, jogging, swimming or biking. Strength and stretching exercises are best for overall stamina and flexibility.  The simplest, positive change you can make to effectively improve your heart health is to start walking. It's enjoyable, free, easy, social and great exercise. A walking program is flexible and boasts high success rates because people can stick with it. It's easy for walking to become a regular and satisfying part of life.   For Overall Cardiovascular Health:  At least 30 minutes of moderate-intensity aerobic activity at least 5 days per week for a total of 150  OR   At least 25 minutes of vigorous aerobic activity at least 3 days per week for a total of 75 minutes; or a combination of moderate- and vigorous-intensity aerobic activity  AND   Moderate- to high-intensity muscle-strengthening activity at least 2 days per week for additional health benefits.  For Lowering Blood Pressure and Cholesterol  An average 40 minutes of moderate- to vigorous-intensity aerobic activity 3 or 4 times per week  What if I can't make it to the time goal? Something is always better than nothing! And everyone has to start somewhere. Even if you've been sedentary for years, today is the day you can begin to make healthy changes in your life. If you don't think you'll make it for 30 or 40 minutes, set a reachable goal for today. You can work up toward your overall goal by increasing your time as you get stronger. Don't let all-or-nothing thinking rob you of doing what you can every day.   Source:http://www.heart.org    

## 2016-11-20 NOTE — Progress Notes (Signed)
Michael Lam 21 y.o.   Chief Complaint  Patient presents with  . Annual Exam    HISTORY OF PRESENT ILLNESS: This is a 21 y.o. male here for annual exam; no complaints or medical concerns.  HPI   Prior to Admission medications   Medication Sig Start Date End Date Taking? Authorizing Provider  albuterol (PROVENTIL HFA;VENTOLIN HFA) 108 (90 Base) MCG/ACT inhaler Inhale 2 puffs into the lungs every 6 (six) hours as needed for wheezing or shortness of breath (cough, shortness of breath or wheezing.). 02/15/15  Yes Wallis Bamberg, PA-C  albuterol (PROVENTIL) (2.5 MG/3ML) 0.083% nebulizer solution Take 2.5 mg by nebulization every 6 (six) hours as needed for wheezing or shortness of breath.   Yes [provider]  fluocinonide ointment (LIDEX) 0.05 % Apply 1 application topically 2 (two) times daily. 05/25/15  Yes Rodolph Bong, MD  hydrOXYzine (VISTARIL) 50 MG capsule Take 1 capsule (50 mg total) by mouth at bedtime as needed. 10/05/15  Yes Wallis Bamberg, PA-C  ibuprofen (ADVIL,MOTRIN) 800 MG tablet Take 1 tablet (800 mg total) by mouth 3 (three) times daily. 09/03/15  Yes Cheri Fowler, PA-C  cetirizine (ZYRTEC) 10 MG tablet Take 1 tablet (10 mg total) by mouth daily. Patient not taking: Reported on 11/20/2016 02/15/15   Wallis Bamberg, PA-C    No Known Allergies  Patient Active Problem List   Diagnosis Date Noted  . Pilonidal cyst with abscess 09/01/2015  . Rash and nonspecific skin eruption 05/25/2015  . Epididymitis 05/25/2015  . Anxiety 04/13/2015    Past Medical History:  Diagnosis Date  . Allergy   . Anxiety   . Asthma     Past Surgical History:  Procedure Laterality Date  . TONSILLECTOMY      Social History   Social History  . Marital status: Single    Spouse name: N/A  . Number of children: N/A  . Years of education: N/A   Occupational History  . Not on file.   Social History Main Topics  . Smoking status: Never Smoker  . Smokeless tobacco: Never  Used  . Alcohol use No  . Drug use: No  . Sexual activity: Not on file   Other Topics Concern  . Not on file   Social History Narrative  . No narrative on file    Family History  Problem Relation Age of Onset  . Hyperlipidemia Father   . Hypertension Father   . Diabetes Father   . Diabetes Maternal Grandmother   . Stroke Maternal Grandfather   . Hypertension Maternal Grandfather   . Stroke Paternal Grandfather      Review of Systems  Constitutional: Negative.  Negative for chills, fever and weight loss.  HENT: Negative.  Negative for hearing loss, sore throat and tinnitus.   Eyes:       Unable to focus objects at a distance.  Respiratory: Negative.  Negative for cough, hemoptysis and shortness of breath.   Cardiovascular: Positive for chest pain.  Gastrointestinal: Negative for abdominal pain, blood in stool, constipation, diarrhea, melena, nausea and vomiting.  Genitourinary: Negative.  Negative for dysuria and hematuria.  Musculoskeletal: Negative.  Negative for back pain, myalgias and neck pain.  Skin: Negative.  Negative for rash.  Neurological: Negative.  Negative for dizziness and headaches.  Endo/Heme/Allergies: Negative.   Psychiatric/Behavioral: The patient is nervous/anxious.   All other systems reviewed and are negative.   Vitals:   11/20/16 1332  BP: 105/67  Pulse: 70  Resp: 16  Temp: 98.4 F (36.9 C)  SpO2: 98%    Physical Exam  Constitutional: He is oriented to person, place, and time. He appears well-developed and well-nourished.  HENT:  Head: Normocephalic and atraumatic.  Right Ear: External ear normal.  Left Ear: External ear normal.  Nose: Nose normal.  Mouth/Throat: Oropharynx is clear and moist.  Eyes: Pupils are equal, round, and reactive to light. Conjunctivae and EOM are normal.  Neck: Normal range of motion. Neck supple. No JVD present. No thyromegaly present.  Cardiovascular: Normal rate, regular rhythm, normal heart sounds and  intact distal pulses.   Pulmonary/Chest: Effort normal and breath sounds normal.  Abdominal: Soft. Bowel sounds are normal. He exhibits no distension. There is no tenderness.  Musculoskeletal: Normal range of motion.  Lymphadenopathy:    He has no cervical adenopathy.  Neurological: He is alert and oriented to person, place, and time. No sensory deficit. He exhibits normal muscle tone.  Skin: Skin is warm and dry. Capillary refill takes less than 2 seconds. No rash noted.  Psychiatric: He has a normal mood and affect. His behavior is normal.  Vitals reviewed.  Dg Chest 2 View  Result Date: 11/20/2016 CLINICAL DATA:  Atypical chest pain EXAM: CHEST  2 VIEW COMPARISON:  12/13/2005 FINDINGS: The heart size and mediastinal contours are within normal limits. Both lungs are clear. The visualized skeletal structures are unremarkable. IMPRESSION: No active cardiopulmonary disease. Electronically Signed   By: Norva Pavlov M.D.   On: 11/20/2016 14:14     ASSESSMENT & PLAN: [name] was seen today for annual exam.  Diagnoses and all orders for this visit:  Routine general medical examination at a health care facility -     CBC with Differential -     Comprehensive metabolic panel -     Hemoglobin A1c -     Lipid panel -     TSH -     HIV antibody  Need for Tdap vaccination -     Tdap vaccine greater than or equal to 7yo IM  Atypical chest pain -     DG Chest 2 View; Future  Anxiety state -     Ambulatory referral to Psychiatry     Patient Instructions       IF you received an x-ray today, you will receive an invoice from [facility]. Please contact [facility] at [phone] with questions or concerns regarding your invoice.   IF you received labwork today, you will receive an invoice from [facility]. Please contact [facility] at [phone] with questions or concerns regarding your invoice.   Our billing staff will not be able to assist you with questions  regarding bills from these companies.  You will be contacted with the lab results as soon as they are available. The fastest way to get your results is to activate your My Chart account. Instructions are located on the last page of this paperwork. If you have not heard from [place] regarding the results in 2 weeks, please contact this office.        Health Maintenance, Male A healthy lifestyle and preventive care is important for your health and wellness. Ask your health care provider about what schedule of regular examinations is right for you. What should I know about weight and diet? Eat a Healthy Diet  Eat plenty of vegetables, fruits, whole grains, low-fat dairy products, and lean protein.  Do not eat a lot of foods high in solid fats, added sugars, or salt.  Maintain  a Healthy Weight Regular exercise can help you achieve or maintain a healthy weight. You should:  Do at least 150 minutes of exercise each week. The exercise should increase your heart rate and make you sweat (moderate-intensity exercise).  Do strength-training exercises at least twice a week.  Watch Your Levels of Cholesterol and Blood Lipids  Have your blood tested for lipids and cholesterol every 5 years starting at 22 years of age. If you are at high risk for heart disease, you should start having your blood tested when you are 22 years old. You may need to have your cholesterol levels checked more often if: ? Your lipid or cholesterol levels are high. ? You are older than 22 years of age. ? You are at high risk for heart disease.  What should I know about cancer screening? Many types of cancers can be detected early and may often be prevented. Lung Cancer  You should be screened every year for lung cancer if: ? You are a current smoker who has smoked for at least 30 years. ? You are a former smoker who has quit within the past 15 years.  Talk to your health care provider about your screening options, when  you should start screening, and how often you should be screened.  Colorectal Cancer  Routine colorectal cancer screening usually begins at 22 years of age and should be repeated every 5-10 years until you are 22 years old. You may need to be screened more often if early forms of precancerous polyps or small growths are found. Your health care provider may recommend screening at an earlier age if you have risk factors for colon cancer.  Your health care provider may recommend using home test kits to check for hidden blood in the stool.  A small camera at the end of a tube can be used to examine your colon (sigmoidoscopy or colonoscopy). This checks for the earliest forms of colorectal cancer.  Prostate and Testicular Cancer  Depending on your age and overall health, your health care provider may do certain tests to screen for prostate and testicular cancer.  Talk to your health care provider about any symptoms or concerns you have about testicular or prostate cancer.  Skin Cancer  Check your skin from head to toe regularly.  Tell your health care provider about any new moles or changes in moles, especially if: ? There is a change in a mole's size, shape, or color. ? You have a mole that is larger than a pencil eraser.  Always use sunscreen. Apply sunscreen liberally and repeat throughout the day.  Protect yourself by wearing long sleeves, pants, a wide-brimmed hat, and sunglasses when outside.  What should I know about heart disease, diabetes, and high blood pressure?  If you are 2018-22 years of age, have your blood pressure checked every 3-5 years. If you are 10040 years of age or older, have your blood pressure checked every year. You should have your blood pressure measured twice-once when you are at a hospital or clinic, and once when you are not at a hospital or clinic. Record the average of the two measurements. To check your blood pressure when you are not at a hospital or clinic,  you can use: ? An automated blood pressure machine at a pharmacy. ? A home blood pressure monitor.  Talk to your health care provider about your target blood pressure.  If you are between 5745-328 years old, ask your health care provider if you  should take aspirin to prevent heart disease.  Have regular diabetes screenings by checking your fasting blood sugar level. ? If you are at a normal weight and have a low risk for diabetes, have this test once every three years after the age of 64. ? If you are overweight and have a high risk for diabetes, consider being tested at a younger age or more often.  A one-time screening for abdominal aortic aneurysm (AAA) by ultrasound is recommended for men aged 65-75 years who are current or former smokers. What should I know about preventing infection? Hepatitis B If you have a higher risk for hepatitis B, you should be screened for this virus. Talk with your health care provider to find out if you are at risk for hepatitis B infection. Hepatitis C Blood testing is recommended for:  Everyone born from [birth year] through 1966.  Anyone with known risk factors for hepatitis C.  Sexually Transmitted Diseases (STDs)  You should be screened each year for STDs including gonorrhea and chlamydia if: ? You are sexually active and are younger than 22 years of age. ? You are older than 22 years of age and your health care provider tells you that you are at risk for this type of infection. ? Your sexual activity has changed since you were last screened and you are at an increased risk for chlamydia or gonorrhea. Ask your health care provider if you are at risk.  Talk with your health care provider about whether you are at high risk of being infected with HIV. Your health care provider may recommend a prescription medicine to help prevent HIV infection.  What else can I do?  Schedule regular health, dental, and eye exams.  Stay current with your vaccines  (immunizations).  Do not use any tobacco products, such as cigarettes, chewing tobacco, and e-cigarettes. If you need help quitting, ask your health care provider.  Limit alcohol intake to no more than 2 drinks per day. One drink equals 12 ounces of beer, 5 ounces of wine, or 1 ounces of hard liquor.  Do not use street drugs.  Do not share needles.  Ask your health care provider for help if you need support or information about quitting drugs.  Tell your health care provider if you often feel depressed.  Tell your health care provider if you have ever been abused or do not feel safe at home. This information is not intended to replace advice given to you by your health care provider. Make sure you discuss any questions you have with your health care provider. Document Released: 07/05/2007 Document Revised: 09/05/2015 Document Reviewed: 10/10/2014 Elsevier Interactive Patient Education  2018 [employer].  American Heart Association (AHA) Exercise Recommendation  Being physically active is important to prevent heart disease and stroke, the nation's No. 1and No. 5killers. To improve overall cardiovascular health, we suggest at least 150 minutes per week of moderate exercise or 75 minutes per week of vigorous exercise (or a combination of moderate and vigorous activity). Thirty minutes a day, five times a week is an easy goal to remember. You will also experience benefits even if you divide your time into two or three segments of 10 to 15 minutes per day.  For people who would benefit from lowering their blood pressure or cholesterol, we recommend 40 minutes of aerobic exercise of moderate to vigorous intensity three to four times a week to lower the risk for heart attack and stroke.  Physical activity is anything that  makes you move your body and burn calories.  This includes things like climbing stairs or playing sports. Aerobic exercises benefit your heart, and include walking, jogging,  swimming or biking. Strength and stretching exercises are best for overall stamina and flexibility.  The simplest, positive change you can make to effectively improve your heart health is to start walking. It's enjoyable, free, easy, social and great exercise. A walking program is flexible and boasts high success rates because people can stick with it. It's easy for walking to become a regular and satisfying part of life.   For Overall Cardiovascular Health:  At least 30 minutes of moderate-intensity aerobic activity at least 5 days per week for a total of 150  OR   At least 25 minutes of vigorous aerobic activity at least 3 days per week for a total of 75 minutes; or a combination of moderate- and vigorous-intensity aerobic activity  AND   Moderate- to high-intensity muscle-strengthening activity at least 2 days per week for additional health benefits.  For Lowering Blood Pressure and Cholesterol  An average 40 minutes of moderate- to vigorous-intensity aerobic activity 3 or 4 times per week  What if I can't make it to the time goal? Something is always better than nothing! And everyone has to start somewhere. Even if you've been sedentary for years, today is the day you can begin to make healthy changes in your life. If you don't think you'll make it for 30 or 40 minutes, set a reachable goal for today. You can work up toward your overall goal by increasing your time as you get stronger. Don't let all-or-nothing thinking rob you of doing what you can every day.  Source:[email or web address], MD [facility]

## 2016-11-21 LAB — HEMOGLOBIN A1C
Est. average glucose Bld gHb Est-mCnc: 108 mg/dL
Hgb A1c MFr Bld: 5.4 % (ref 4.8–5.6)

## 2016-11-21 LAB — COMPREHENSIVE METABOLIC PANEL
ALT: 31 IU/L (ref 0–44)
AST: 21 IU/L (ref 0–40)
Albumin/Globulin Ratio: 1.9 (ref 1.2–2.2)
Albumin: 4.7 g/dL (ref 3.5–5.5)
Alkaline Phosphatase: 62 IU/L (ref 39–117)
BUN/Creatinine Ratio: 13 (ref 9–20)
BUN: 12 mg/dL (ref 6–20)
Bilirubin Total: 0.5 mg/dL (ref 0.0–1.2)
CO2: 24 mmol/L (ref 20–29)
Calcium: 9.4 mg/dL (ref 8.7–10.2)
Chloride: 103 mmol/L (ref 96–106)
Creatinine, Ser: 0.9 mg/dL (ref 0.76–1.27)
GFR calc Af Amer: 140 mL/min/{1.73_m2} (ref >59)
GFR calc non Af Amer: 121 mL/min/{1.73_m2} (ref >59)
Globulin, Total: 2.5 g/dL (ref 1.5–4.5)
Glucose: 95 mg/dL (ref 65–99)
Potassium: 4.7 mmol/L (ref 3.5–5.2)
Sodium: 141 mmol/L (ref 134–144)
Total Protein: 7.2 g/dL (ref 6.0–8.5)

## 2016-11-21 LAB — CBC WITH DIFFERENTIAL/PLATELET
Basophils Absolute: 0 10*3/uL (ref 0.0–0.2)
Basos: 0 %
EOS (ABSOLUTE): 0.1 10*3/uL (ref 0.0–0.4)
Eos: 2 %
Hematocrit: 45.6 % (ref 37.5–51.0)
Hemoglobin: 15.3 g/dL (ref 13.0–17.7)
Immature Grans (Abs): 0 10*3/uL (ref 0.0–0.1)
Immature Granulocytes: 0 %
Lymphocytes Absolute: 1.8 10*3/uL (ref 0.7–3.1)
Lymphs: 35 %
MCH: 27.6 pg (ref 26.6–33.0)
MCHC: 33.6 g/dL (ref 31.5–35.7)
MCV: 82 fL (ref 79–97)
Monocytes Absolute: 0.5 10*3/uL (ref 0.1–0.9)
Monocytes: 9 %
Neutrophils Absolute: 2.8 10*3/uL (ref 1.4–7.0)
Neutrophils: 54 %
Platelets: 174 10*3/uL (ref 150–379)
RBC: 5.54 x10E6/uL (ref 4.14–5.80)
RDW: 13.5 % (ref 12.3–15.4)
WBC: 5.1 10*3/uL (ref 3.4–10.8)

## 2016-11-21 LAB — TSH: TSH: 2.08 u[IU]/mL (ref 0.450–4.500)

## 2016-11-21 LAB — HIV ANTIBODY (ROUTINE TESTING W REFLEX): HIV Screen 4th Generation wRfx: NONREACTIVE

## 2016-11-21 LAB — LIPID PANEL
Chol/HDL Ratio: 4.1 ratio (ref 0.0–5.0)
Cholesterol, Total: 186 mg/dL (ref 100–199)
HDL: 45 mg/dL (ref >39)
LDL Calculated: 123 mg/dL — ABNORMAL HIGH (ref 0–99)
Triglycerides: 88 mg/dL (ref 0–149)
VLDL Cholesterol Cal: 18 mg/dL (ref 5–40)

## 2016-11-25 ENCOUNTER — Ambulatory Visit (INDEPENDENT_AMBULATORY_CARE_PROVIDER_SITE_OTHER): Payer: BLUE CROSS/BLUE SHIELD | Admitting: Urgent Care

## 2016-11-25 ENCOUNTER — Encounter: Payer: Self-pay | Admitting: Urgent Care

## 2016-11-25 VITALS — BP 125/83 | HR 97 | Temp 98.6°F | Resp 16 | Ht 70.0 in | Wt 233.2 lb

## 2016-11-25 DIAGNOSIS — J069 Acute upper respiratory infection, unspecified: Secondary | ICD-10-CM | POA: Diagnosis not present

## 2016-11-25 DIAGNOSIS — Z9109 Other allergy status, other than to drugs and biological substances: Secondary | ICD-10-CM

## 2016-11-25 DIAGNOSIS — B9789 Other viral agents as the cause of diseases classified elsewhere: Secondary | ICD-10-CM

## 2016-11-25 MED ORDER — HYDROCOD POLST-CPM POLST ER 10-8 MG/5ML PO SUER: 5.0000 mL | Freq: Every evening | ORAL | 0 refills | Status: DC | PRN

## 2016-11-25 MED ORDER — FLUTICASONE PROPIONATE 50 MCG/ACT NA SUSP: 2.0000 | Freq: Every day | NASAL | 11 refills | Status: DC

## 2016-11-25 MED ORDER — CETIRIZINE HCL 10 MG PO TABS: 10.0000 mg | ORAL_TABLET | Freq: Every day | ORAL | 11 refills | Status: DC

## 2016-11-25 MED ORDER — PSEUDOEPHEDRINE HCL ER 120 MG PO TB12: 120.0000 mg | ORAL_TABLET | Freq: Two times a day (BID) | ORAL | 3 refills | Status: DC

## 2016-11-25 MED ORDER — BENZONATATE 100 MG PO CAPS: 100.0000 mg | ORAL_CAPSULE | Freq: Three times a day (TID) | ORAL | 0 refills | Status: DC | PRN

## 2016-11-25 NOTE — Progress Notes (Signed)
  MRN: [id number] DOB: 06/22/1995  Subjective:   Michael Lam is a 21 y.o. male presenting for 4 day history of stuffy and runny nose, subjective fever, sinus pressure that progressed to sinus pain, throat pain, bilateral ear pain, productive cough. Started having chest tightness last night and today. Has tried NyQuil with minimal relief. Has a history of allergies but does not take anything for allergies. Denies chest pain, n/v, abdominal pain. Denies smoking cigarettes.  Michael Lam has a current medication list which includes the following prescription(s): hydroxyzine. Also has No Known Allergies.  Michael Lam  has a past medical history of Allergy, Anxiety, and Asthma. Also  has a past surgical history that includes Tonsillectomy.  Objective:   Vitals: BP 125/83   Pulse 97   Temp 98.6 F (37 C) (Oral)   Resp 16   Ht 5\' 10"  (1.778 m)   Wt 233 lb 3.2 oz (105.8 kg)   SpO2 96%   BMI 33.46 kg/m   Physical Exam  Constitutional: He is oriented to person, place, and time. He appears well-developed and well-nourished.  HENT:  TM's intact bilaterally, no effusions or erythema. Nasal turbinates swollen and erythematous, nasal passages minimally patent. Oropharynx without erythema or exudates, mucous membranes moist.  Eyes: No scleral icterus.  Neck: Normal range of motion. Neck supple.  Cardiovascular: Normal rate, regular rhythm and intact distal pulses. Exam reveals no gallop and no friction rub.  No murmur heard. Pulmonary/Chest: No respiratory distress. He has no wheezes. He has no rales.  Lymphadenopathy:    He has no cervical adenopathy.  Neurological: He is alert and oriented to person, place, and time.   Assessment and Plan :   1. Viral URI with cough 2. Environmental allergies - Start supportive care for viral URI. Zyrtec with Sudafed to address allergies. Patient will hold onto Flonase and get started with it once his URI symptoms are resolved. Return-to-clinic  precautions discussed, patient verbalized understanding.   Wallis BambergMario Kullen Tomasetti, PA-C [facility] at [facility] [phone] 11/25/2016  2:26 PM

## 2016-11-25 NOTE — Patient Instructions (Addendum)
Allergies An allergy is when your body reacts to a substance in a way that is not normal. An allergic reaction can happen after you:  Eat something.  Breathe in something.  Touch something.  You can be allergic to:  Things that are only around during certain seasons, like molds and pollens.  Foods.  Drugs.  Insects.  Animal dander.  What are the signs or symptoms?  Puffiness (swelling). This may happen on the lips, face, tongue, mouth, or throat.  Sneezing.  Coughing.  Breathing loudly (wheezing).  Stuffy nose.  Tingling in the mouth.  A rash.  Itching.  Itchy, red, puffy areas of skin (hives).  Watery eyes.  Throwing up (vomiting).  Watery poop (diarrhea).  Dizziness.  Feeling faint or fainting.  Trouble breathing or swallowing.  A tight feeling in the chest.  A fast heartbeat. How is this diagnosed? Allergies can be diagnosed with:  A medical and family history.  Skin tests.  Blood tests.  A food diary. A food diary is a record of all the foods, drinks, and symptoms you have each day.  The results of an elimination diet. This diet involves making sure not to eat certain foods and then seeing what happens when you start eating them again.  How is this treated? There is no cure for allergies, but allergic reactions can be treated with medicine. Severe reactions usually need to be treated at a hospital. How is this prevented? The best way to prevent an allergic reaction is to avoid the thing you are allergic to. Allergy shots and medicines can also help prevent reactions in some cases. This information is not intended to replace advice given to you by your health care provider. Make sure you discuss any questions you have with your health care provider. Document Released: 05/03/2012 Document Revised: 09/03/2015 Document Reviewed: 10/18/2013 Elsevier Interactive Patient Education  2018 [employer]vier Inc.   Upper Respiratory Infection,  Adult Most upper respiratory infections (URIs) are caused by a virus. A URI affects the nose, throat, and upper air passages. The most common type of URI is often called "the common cold." Follow these instructions at home:  Take medicines only as told by your doctor.  Gargle warm saltwater or take cough drops to comfort your throat as told by your doctor.  Use a warm mist humidifier or inhale steam from a shower to increase air moisture. This may make it easier to breathe.  Drink enough fluid to keep your pee (urine) clear or pale yellow.  Eat soups and other clear broths.  Have a healthy diet.  Rest as needed.  Go back to work when your fever is gone or your doctor says it is okay. ? You may need to stay home longer to avoid giving your URI to others. ? You can also wear a face mask and wash your hands often to prevent spread of the virus.  Use your inhaler more if you have asthma.  Do not use any tobacco products, including cigarettes, chewing tobacco, or electronic cigarettes. If you need help quitting, ask your doctor. Contact a doctor if:  You are getting worse, not better.  Your symptoms are not helped by medicine.  You have chills.  You are getting more short of breath.  You have brown or red mucus.  You have yellow or brown discharge from your nose.  You have pain in your face, especially when you bend forward.  You have a fever.  You have puffy (swollen) neck  glands.  You have pain while swallowing.  You have white areas in the back of your throat. Get help right away if:  You have very bad or constant: ? Headache. ? Ear pain. ? Pain in your forehead, behind your eyes, and over your cheekbones (sinus pain). ? Chest pain.  You have long-lasting (chronic) lung disease and any of the following: ? Wheezing. ? Long-lasting cough. ? Coughing up blood. ? A change in your usual mucus.  You have a stiff neck.  You have changes in  your: ? Vision. ? Hearing. ? Thinking. ? Mood. This information is not intended to replace advice given to you by your health care provider. Make sure you discuss any questions you have with your health care provider. Document Released: 06/25/2007 Document Revised: 09/09/2015 Document Reviewed: 04/13/2013 Elsevier Interactive Patient Education  2018 [employer]    IF you received an x-ray today, you will [facility]. Please contact [facility] at [phone] with questions or concerns regarding your invoice.   IF you received labwork today, you will receive an invoice from [facility]. Please contact [facility] at [phone] with questions or concerns regarding your invoice.   Our billing staff will not be able to assist you with questions regarding bills from these companies.  You will be contacted with the lab results as soon as they are available. The fastest way to get your results is to activate your My Chart account. Instructions are located on the last page of this paperwork. If you have not heard from us regarding the results in 2 weeks, please contact this office.

## 2016-12-09 ENCOUNTER — Encounter: Payer: Self-pay | Admitting: Emergency Medicine

## 2016-12-09 ENCOUNTER — Ambulatory Visit (INDEPENDENT_AMBULATORY_CARE_PROVIDER_SITE_OTHER): Payer: BLUE CROSS/BLUE SHIELD | Admitting: Emergency Medicine

## 2016-12-09 ENCOUNTER — Other Ambulatory Visit: Payer: Self-pay

## 2016-12-09 VITALS — BP 116/84 | HR 95 | Temp 97.5°F | Resp 16 | Ht 70.75 in | Wt 235.0 lb

## 2016-12-09 DIAGNOSIS — G4459 Other complicated headache syndrome: Secondary | ICD-10-CM | POA: Insufficient documentation

## 2016-12-09 DIAGNOSIS — G44209 Tension-type headache, unspecified, not intractable: Secondary | ICD-10-CM | POA: Insufficient documentation

## 2016-12-09 DIAGNOSIS — G4489 Other headache syndrome: Secondary | ICD-10-CM

## 2016-12-09 DIAGNOSIS — G43719 Chronic migraine without aura, intractable, without status migrainosus: Secondary | ICD-10-CM

## 2016-12-09 NOTE — Patient Instructions (Addendum)
Take Excedrin OTC for migraine as needed.    IF you received an x-ray today, you will receive an invoice from [facility]. Please contact [facility] at [phone] with questions or concerns regarding your invoice.   IF you received labwork today, you will receive an invoice from [facility]. Please contact [facility] at [phone] with questions or concerns regarding your invoice.   Our billing staff will not be able to assist you with questions regarding bills from these companies.  You will be contacted with the lab results as soon as they are available. The fastest way to get your results is to activate your My Chart account. Instructions are located on the last page of this paperwork. If you have not heard from us regarding the results in 2 weeks, please contact this office.     Migraine Headache A migraine headache is a very strong throbbing pain on one side or both sides of your head. Migraines can also cause other symptoms. Talk with your doctor about what things may bring on (trigger) your migraine headaches. Follow these instructions at home: Medicines  Take over-the-counter and prescription medicines only as told by your doctor.  Do not drive or use heavy machinery while taking prescription pain medicine.  To prevent or treat constipation while you are taking prescription pain medicine, your doctor may recommend that you: ? Drink enough fluid to keep your pee (urine) clear or pale yellow. ? Take over-the-counter or prescription medicines. ? Eat foods that are high in fiber. These include fresh fruits and vegetables, whole grains, and beans. ? Limit foods that are high in fat and processed sugars. These include fried and sweet foods. Lifestyle  Avoid alcohol.  Do not use any products that contain nicotine or tobacco, such as cigarettes and e-cigarettes. If you need help quitting, ask your doctor.  Get at least 8 hours of sleep every night.  Limit your  stress. General instructions   Keep a journal to find out what may bring on your migraines. For example, write down: ? What you eat and drink. ? How much sleep you get. ? Any change in what you eat or drink. ? Any change in your medicines.  If you have a migraine: ? Avoid things that make your symptoms worse, such as bright lights. ? It may help to lie down in a dark, quiet room. ? Do not drive or use heavy machinery. ? Ask your doctor what activities are safe for you.  Keep all follow-up visits as told by your doctor. This is important. Contact a doctor if:  You get a migraine that is different or worse than your usual migraines. Get help right away if:  Your migraine gets very bad.  You have a fever.  You have a stiff neck.  You have trouble seeing.  Your muscles feel weak or like you cannot control them.  You start to lose your balance a lot.  You start to have trouble walking.  You pass out (faint). This information is not intended to replace advice given to you by your health care provider. Make sure you discuss any questions you have with your health care provider. Document Released: 10/16/2007 Document Revised: 07/27/2015 Document Reviewed: 06/25/2015 Elsevier Interactive Patient Education  2017 [employer]

## 2016-12-09 NOTE — Progress Notes (Signed)
Michael Lam 21 y.o.   Chief Complaint  Patient presents with  . head    pain in the back on the right lower and goes over to the left side, hurts when smiling and laughing x 3 mos    HISTORY OF PRESENT ILLNESS: This is a 21 y.o. male with h/o migraines x many years c/o right sided posterior headaches triggered by smiling or laughing hard, radiate to front and last 1-2 days; +nausea and photophobia; OTC medication helps; sometimes notices small lumps in the back of his head that later disappears.   HPI   Prior to Admission medications   Medication Sig Start Date End Date Taking? Authorizing Provider  fluticasone (FLONASE) 50 MCG/ACT nasal spray Place 2 sprays daily into both nostrils. 11/25/16  Yes Wallis BambergMani, Mario, PA-C  hydrOXYzine (VISTARIL) 50 MG capsule Take 1 capsule (50 mg total) by mouth at bedtime as needed. 10/05/15  Yes Wallis BambergMani, Mario, PA-C  pseudoephedrine (SUDAFED 12 HOUR) 120 MG 12 hr tablet Take 1 tablet (120 mg total) 2 (two) times daily by mouth. 11/25/16  Yes Wallis BambergMani, Mario, PA-C  benzonatate (TESSALON) 100 MG capsule Take 1-2 capsules (100-200 mg total) 3 (three) times daily as needed by mouth. Patient not taking: Reported on 12/09/2016 11/25/16   Wallis BambergMani, Mario, PA-C  cetirizine (ZYRTEC) 10 MG tablet Take 1 tablet (10 mg total) daily by mouth. Patient not taking: Reported on 12/09/2016 11/25/16   Wallis BambergMani, Mario, PA-C  chlorpheniramine-HYDROcodone [facility](TUSSIONEX PENNKINETIC ER) 10-8 MG/5ML SUER Take 5 mLs at bedtime as needed by mouth. Patient not taking: Reported on 12/09/2016 11/25/16   Wallis BambergMani, Mario, PA-C    No Known Allergies  Patient Active Problem List   Diagnosis Date Noted  . Pilonidal cyst with abscess 09/01/2015  . Rash and nonspecific skin eruption 05/25/2015  . Epididymitis 05/25/2015  . Anxiety 04/13/2015    Past Medical History:  Diagnosis Date  . Allergy   . Anxiety   . Asthma     Past Surgical History:  Procedure Laterality Date  . TONSILLECTOMY        Social History   Socioeconomic History  . Marital status: Single    Spouse name: Not on file  . Number of children: Not on file  . Years of education: Not on file  . Highest education level: Not on file  Social Needs  . Financial resource strain: Not on file  . Food insecurity - worry: Not on file  . Food insecurity - inability: Not on file  . Transportation needs - medical: Not on file  . Transportation needs - non-medical: Not on file  Occupational History  . Not on file  Tobacco Use  . Smoking status: Never Smoker  . Smokeless tobacco: Never Used  Substance and Sexual Activity  . Alcohol use: No    Alcohol/week: 0.0 oz  . Drug use: No  . Sexual activity: Not on file  Other Topics Concern  . Not on file  Social History Narrative  . Not on file    Family History  Problem Relation Age of Onset  . Hyperlipidemia Father   . Hypertension Father   . Diabetes Father   . Diabetes Maternal Grandmother   . Stroke Maternal Grandfather   . Hypertension Maternal Grandfather   . Stroke Paternal Grandfather      Review of Systems  Constitutional: Negative.  Negative for chills and fever.       Weight gain  HENT: Negative.  Negative for congestion, nosebleeds and sore  throat.   Eyes: Negative.  Negative for blurred vision, double vision, discharge and redness.  Respiratory: Negative.  Negative for cough and shortness of breath.   Cardiovascular: Negative for chest pain, palpitations and leg swelling.  Gastrointestinal: Positive for nausea. Negative for abdominal pain, diarrhea and vomiting.  Genitourinary: Negative for dysuria and hematuria.  Musculoskeletal: Negative for back pain, myalgias and neck pain.  Skin: Negative for rash.  Neurological: Positive for headaches. Negative for dizziness, sensory change, focal weakness, loss of consciousness and weakness.  Endo/Heme/Allergies: Negative.   All other systems reviewed and are negative.   Vitals:   12/09/16 1311   BP: 116/84  Pulse: 95  Resp: 16  Temp: (!) 97.5 F (36.4 C)  SpO2: 98%    Physical Exam  Constitutional: He is oriented to person, place, and time. He appears well-developed and well-nourished.  HENT:  Head: Normocephalic and atraumatic.  Right Ear: External ear normal.  Left Ear: External ear normal.  Nose: Nose normal.  Eyes: Conjunctivae and EOM are normal. Pupils are equal, round, and reactive to light.  Fundoscopic exam:      The right eye shows no hemorrhage and no papilledema.       The left eye shows no hemorrhage and no papilledema.  Neck: Normal range of motion. Neck supple. No JVD present. No thyromegaly present.  Cardiovascular: Normal rate, regular rhythm, normal heart sounds and intact distal pulses.  Pulmonary/Chest: Effort normal and breath sounds normal. No respiratory distress.  Abdominal: Soft. Bowel sounds are normal. He exhibits no distension. There is no tenderness.  Musculoskeletal: Normal range of motion.  Lymphadenopathy:    He has no cervical adenopathy.  Neurological: He is alert and oriented to person, place, and time. He displays normal reflexes. No cranial nerve deficit or sensory deficit. He exhibits normal muscle tone. Coordination normal.  Skin: Skin is warm and dry. Capillary refill takes less than 2 seconds. No rash noted.  Psychiatric: He has a normal mood and affect. His behavior is normal.  Vitals reviewed.    ASSESSMENT & PLAN: Michael Lam was seen today for head.  Diagnoses and all orders for this visit:  Intractable chronic migraine without aura and without status migrainosus -     Ambulatory referral to Neurology  Other headache syndrome -     CT Head Wo Contrast; Future -     Ambulatory referral to Neurology    Patient Instructions   Take Excedrin OTC for migraine as needed.    IF you received an x-ray today, you will receive an invoice from [facility]. Please contact [facility] at [phone] with  questions or concerns regarding your invoice.   IF you received labwork today, you will receive an invoice from [facility]. Please contact [facility] at [phone] with questions or concerns regarding your invoice.   Our billing staff will not be able to assist you with questions regarding bills from these companies.  You will be contacted with the lab results as soon as they are available. The fastest way to get your results is to activate your My Chart account. Instructions are located on the last page of this paperwork. If you have not heard from us regarding the results in 2 weeks, please contact this office.     Migraine Headache A migraine headache is a very strong throbbing pain on one side or both sides of your head. Migraines can also cause other symptoms. Talk with your doctor about what things may bring on (trigger) your migraine  headaches. Follow these instructions at home: Medicines  Take over-the-counter and prescription medicines only as told by your doctor.  Do not drive or use heavy machinery while taking prescription pain medicine.  To prevent or treat constipation while you are taking prescription pain medicine, your doctor may recommend that you: ? Drink enough fluid to keep your pee (urine) clear or pale yellow. ? Take over-the-counter or prescription medicines. ? Eat foods that are high in fiber. These include fresh fruits and vegetables, whole grains, and beans. ? Limit foods that are high in fat and processed sugars. These include fried and sweet foods. Lifestyle  Avoid alcohol.  Do not use any products that contain nicotine or tobacco, such as cigarettes and e-cigarettes. If you need help quitting, ask your doctor.  Get at least 8 hours of sleep every night.  Limit your stress. General instructions   Keep a journal to find out what may bring on your migraines. For example, write down: ? What you eat and drink. ? How much sleep you get. ? Any change in  what you eat or drink. ? Any change in your medicines.  If you have a migraine: ? Avoid things that make your symptoms worse, such as bright lights. ? It may help to lie down in a dark, quiet room. ? Do not drive or use heavy machinery. ? Ask your doctor what activities are safe for you.  Keep all follow-up visits as told by your doctor. This is important. Contact a doctor if:  You get a migraine that is different or worse than your usual migraines. Get help right away if:  Your migraine gets very bad.  You have a fever.  You have a stiff neck.  You have trouble seeing.  Your muscles feel weak or like you cannot control them.  You start to lose your balance a lot.  You start to have trouble walking.  You pass out (faint). This information is not intended to replace advice given to you by your health care provider. Make sure you discuss any questions you have with your health care provider. Document Released: 10/16/2007 Document Revised: 07/27/2015 Document Reviewed: 06/25/2015 Elsevier Interactive Patient Education  2017 Elsevier Inc.      Edwina Barth, MD [facility]

## 2016-12-23 ENCOUNTER — Telehealth: Payer: Self-pay | Admitting: Emergency Medicine

## 2016-12-23 NOTE — Telephone Encounter (Signed)
Thanks, Pattricia BossAnnie. Let him then follow up with Neurologist as planned first.

## 2016-12-23 NOTE — Telephone Encounter (Signed)
CT Head W/O Contrast has [facility] been [facility]. The decision information stated CT did not meet medical necessity due to absence of focal neurologic deficits and treatment was less than 4 weeks. A peer to peer can be done by calling [facility] at [phone] if desired. Let me know if there is anything else I can do. Thanks!

## 2016-12-31 ENCOUNTER — Encounter: Payer: Self-pay | Admitting: Neurology

## 2016-12-31 ENCOUNTER — Ambulatory Visit (INDEPENDENT_AMBULATORY_CARE_PROVIDER_SITE_OTHER): Payer: BLUE CROSS/BLUE SHIELD | Admitting: Neurology

## 2016-12-31 VITALS — BP 126/75 | HR 75 | Ht 71.0 in | Wt 239.8 lb

## 2016-12-31 DIAGNOSIS — R51 Headache with orthostatic component, not elsewhere classified: Secondary | ICD-10-CM

## 2016-12-31 DIAGNOSIS — H539 Unspecified visual disturbance: Secondary | ICD-10-CM

## 2016-12-31 DIAGNOSIS — G43709 Chronic migraine without aura, not intractable, without status migrainosus: Secondary | ICD-10-CM | POA: Diagnosis not present

## 2016-12-31 DIAGNOSIS — G43009 Migraine without aura, not intractable, without status migrainosus: Secondary | ICD-10-CM

## 2016-12-31 DIAGNOSIS — G8929 Other chronic pain: Secondary | ICD-10-CM

## 2016-12-31 DIAGNOSIS — R519 Headache, unspecified: Secondary | ICD-10-CM

## 2016-12-31 MED ORDER — ONDANSETRON HCL 4 MG PO TABS: 4.0000 mg | ORAL_TABLET | Freq: Three times a day (TID) | ORAL | 11 refills | Status: DC | PRN

## 2016-12-31 MED ORDER — NORTRIPTYLINE HCL 25 MG PO CAPS: 25.0000 mg | ORAL_CAPSULE | Freq: Every day | ORAL | 11 refills | Status: DC

## 2016-12-31 MED ORDER — METHYLPREDNISOLONE 4 MG PO TBPK: ORAL_TABLET | ORAL | 1 refills | Status: DC

## 2016-12-31 MED ORDER — RIZATRIPTAN BENZOATE 10 MG PO TABS: 10.0000 mg | ORAL_TABLET | ORAL | 11 refills | Status: DC | PRN

## 2016-12-31 NOTE — Progress Notes (Signed)
[facility]    Provider:  Dr Lucia GaskinsAhern Referring Provider: Wallis BambergMani, Mario, PA-C Primary Care Physician:  Wallis BambergMani, Mario, PA-C  CC: Migraines and sharp pains hurts when I laugh or smile  HPI:  Michael Lam is a 21 y.o. male here as a referral from Dr. Urban GibsonMani for migraines.  Past medical history migraines and anxiety. He has had migraines since the 5th grade. Grandmother with migraines. He has migraines at least 2-3x a month and they can last 1-2 days, maybe 6 days a month of migraines but he has 12 days of headaches for a total of 20 headache days a month. He does not take OTC analgesics, no medication overuse. Excedrin helped, coffee helps.  Migraines start from th back to the right eye, pounding, throbbing, sharp, light and sound sensitivity, nausea and dizziness. They can last days. He has significant insomnia, he works from 3am-3pm, he has difficult initiating sleep. Headache worse positionally with vision changes. No other focal neurologic deficits, associated symptoms, inciting events or modifiable factors.  Reviewed notes, labs and imaging from outside physicians, which showed:  reviewed primary care notes  , Patient reported pain in the back of the head right lower and goes over to the left side, hurts when smiling and laughing for 3 months, history of migraines for many years complaining of right-sided posterior headaches triggered by smiling or laughing hard, radiates to the front and last 1-2 days with nausea and photophobia.  Sometimes notices small lumps in the back of his head that later disappears.  CT of the head was ordered but not completed.  HIV, TSH, hemoglobin A1c, CBC, CMP normal November 2018.  Medications tried: Lexapro, zofran.  Review of Systems: Patient complains of symptoms per HPI as well as the following symptoms: Blurred vision, headache, insomnia, dizziness, anxiety, allergies. Pertinent negatives and positives per HPI. All others  negative.   Social History   Socioeconomic History  . Marital status: Single    Spouse name: Not on file  . Number of children: 0  . Years of education: Not on file  . Highest education level: Associate degree: academic program  Social Needs  . Financial resource strain: Not on file  . Food insecurity - worry: Not on file  . Food insecurity - inability: Not on file  . Transportation needs - medical: Not on file  . Transportation needs - non-medical: Not on file  Occupational History    Employer: [employer]  Tobacco Use  . Smoking status: Never Smoker  . Smokeless tobacco: Never Used  Substance and Sexual Activity  . Alcohol use: No    Alcohol/week: 0.0 oz  . Drug use: No  . Sexual activity: Not on file  Other Topics Concern  . 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 Hyperlipidemia Father   . Hypertension Father   . Diabetes Father   . Diabetes Maternal Grandmother   . Migraines Maternal Grandmother   . Stroke Maternal Grandfather   . Hypertension Maternal Grandfather   . Stroke Paternal Grandfather     Past Medical History:  Diagnosis Date  . Allergy   . Anxiety   . Asthma     Past Surgical History:  Procedure Laterality Date  . TONSILLECTOMY  Current Outpatient Medications  Medication Sig Dispense Refill  . cetirizine (ZYRTEC) 10 MG tablet Take 1 tablet (10 mg total) daily by mouth. 30 tablet 11  . fluticasone (FLONASE) 50 MCG/ACT nasal spray Place 2 sprays daily into both nostrils. 16 g 11  . hydrOXYzine (VISTARIL) 50 MG capsule Take 1 capsule (50 mg total) by mouth at bedtime as needed. 30 capsule 5  . methylPREDNISolone (MEDROL DOSEPAK) 4 MG TBPK tablet follow package directions 21 tablet 1  . nortriptyline (PAMELOR) 25 MG capsule Take 1 capsule (25 mg total) by mouth at  bedtime. 30 capsule 11  . ondansetron (ZOFRAN) 4 MG tablet Take 1 tablet (4 mg total) by mouth every 8 (eight) hours as needed for nausea or vomiting. 20 tablet 11  . rizatriptan (MAXALT) 10 MG tablet Take 1 tablet (10 mg total) by mouth as needed for migraine. May repeat once in 2 hours if needed 10 tablet 11   No current facility-administered medications for this visit.     Allergies as of 12/31/2016 - Review Complete 12/31/2016  Allergen Reaction Noted  . Peanut oil Rash 03/14/2016  . Eggs or egg-derived products  03/14/2016    Vitals: BP 126/75 (BP Location: Right Arm, Patient Position: Sitting)   Pulse 75   Ht 5\' 11"  (1.803 m)   Wt 239 lb 12.8 oz (108.8 kg)   BMI 33.45 kg/m  Last Weight:  Wt Readings from Last 1 Encounters:  12/31/16 239 lb 12.8 oz (108.8 kg)   Last Height:   Ht Readings from Last 1 Encounters:  12/31/16 5\' 11"  (1.803 m)    Physical exam: Exam: Gen: NAD, conversant, well nourised, obese, well groomed                     CV: RRR, no MRG. No Carotid Bruits. No peripheral edema, warm, nontender Eyes: Conjunctivae clear without exudates or hemorrhage  Neuro: Detailed Neurologic Exam  Speech:    Speech is normal; fluent and spontaneous with normal comprehension.  Cognition:    The patient is oriented to person, place, and time;     recent and remote memory intact;     language fluent;     normal attention, concentration,     fund of knowledge Cranial Nerves:    The pupils are equal, round, and reactive to light. The fundi are normal and spontaneous venous pulsations are present. Visual fields are full to finger confrontation. Extraocular movements are intact. Trigeminal sensation is intact and the muscles of mastication are normal. The face is symmetric. The palate elevates in the midline. Hearing intact. Voice is normal. Shoulder shrug is normal. The tongue has normal motion without fasciculations.   Coordination:    Normal finger to nose and heel  to shin. Normal rapid alternating movements.   Gait:    Heel-toe and tandem gait are normal.   Motor Observation:    No asymmetry, no atrophy, and no involuntary movements noted. Tone:    Normal muscle tone.    Posture:    Posture is normal. normal erect    Strength:    Strength is V/V in the upper and lower limbs.      Sensation: intact to LT     Reflex Exam:  DTR's:    Deep tendon reflexes in the upper and lower extremities are normal bilaterally.   Toes:    The toes are downgoing bilaterally.   Clonus:    Clonus is absent.    Assessment/Plan:  21 year old with migraines, headaches, posterior neck pain (right occipital), positional headache and vision changes.  Need MRI brain w/wo contrast due to concerning positional and occipital headache to eval for space-occupying lesion, mass or other etiology.  Start Nortriptyline for migraine prevention and insomnia  Maxalt: Please take one tablet at the onset of your headache. If it does not improve the symptoms please take one additional tablet. Do not take more then 2 tablets in 24hrs. Do not take use more then 2 to 3 times in a week.  Zofran for nausea  Review migraine diary at next appointment  Magnesium citrate 400mg  to 600mg  daily, riboflavin 400mg  daily, Coenzyme Q 10 100mg  three times daily  Facebook group [facility].  Orders Placed This Encounter  Procedures  . MR BRAIN W WO CONTRAST    Discussed: To prevent or relieve headaches, try the following: Cool Compress. Lie down and place a cool compress on your head.  Avoid headache triggers. If certain foods or odors seem to have triggered your migraines in the past, avoid them. A headache diary might help you identify triggers.  Include physical activity in your daily routine. Try a daily walk or other moderate aerobic exercise.  Manage stress. Find healthy ways to cope with the stressors, such as delegating tasks on your to-do list.  Practice  relaxation techniques. Try deep breathing, yoga, massage and visualization.  Eat regularly. Eating regularly scheduled meals and maintaining a healthy diet might help prevent headaches. Also, drink plenty of fluids.  Follow a regular sleep schedule. Sleep deprivation might contribute to headaches Consider biofeedback. With this mind-body technique, you learn to control certain bodily functions - such as muscle tension, heart rate and blood pressure - to prevent headaches or reduce headache pain.    Proceed to emergency room if you experience new or worsening symptoms or symptoms do not resolve, if you have new neurologic symptoms or if headache is severe, or for any concerning symptom.   Provided education and documentation from American headache Society toolbox including articles on: chronic migraine medication overuse headache, chronic migraines, prevention of migraines, behavioral and other nonpharmacologic treatments for headache.          Naomie Dean, MD  [facility] [address] [place], [place] [address]  Phone [phone] Fax [phone]

## 2016-12-31 NOTE — Patient Instructions (Signed)
Start Nortriptyline 25mg  at bedtime for migraine prevention and insomnia every night  Maxalt(Rizatriptan) as needed: Please take one tablet at the onset of your headache. If it does not improve the symptoms please take one additional tablet. Do not take more then 2 tablets in 24hrs. Do not take use more then 2 to 3 times in a week.  Zofran for nausea or may take with Rizatriptan at onset of migraine  Medrol dosepak for 6 days: Take pills daily in the morning with food  Review migraine diary at next appointment  May consider OTC: Magnesium citrate 400mg  to 600mg  daily, riboflavin 400mg  daily, Coenzyme Q 10 100mg  three times daily  Facebook group [facility].  Ondansetron tablets What is this medicine? ONDANSETRON (on DAN se tron) is used to treat nausea and vomiting caused by chemotherapy. It is also used to prevent or treat nausea and vomiting after surgery. This medicine may be used for other purposes; ask your health care provider or pharmacist if you have questions. COMMON BRAND NAME(S): Zofran What should I tell my health care provider before I take this medicine? They need to know if you have any of these conditions: -heart disease -history of irregular heartbeat -liver disease -low levels of magnesium or potassium in the blood -an unusual or allergic reaction to ondansetron, granisetron, other medicines, foods, dyes, or preservatives -pregnant or trying to get pregnant -breast-feeding How should I use this medicine? Take this medicine by mouth with a glass of water. Follow the directions on your prescription label. Take your doses at regular intervals. Do not take your medicine more often than directed. Talk to your pediatrician regarding the use of this medicine in children. Special care may be needed. Overdosage: If you think you have taken too much of this medicine contact a poison control center or emergency room at once. NOTE: This medicine is only for you.  Do not share this medicine with others. What if I miss a dose? If you miss a dose, take it as soon as you can. If it is almost time for your next dose, take only that dose. Do not take double or extra doses. What may interact with this medicine? Do not take this medicine with any of the following medications: -apomorphine -certain medicines for fungal infections like fluconazole, itraconazole, ketoconazole, posaconazole, voriconazole -cisapride -dofetilide -dronedarone -pimozide -thioridazine -ziprasidone This medicine may also interact with the following medications: -carbamazepine -certain medicines for depression, anxiety, or psychotic disturbances -fentanyl -linezolid -MAOIs like Carbex, Eldepryl, Marplan, Nardil, and Parnate -methylene blue (injected into a vein) -other medicines that prolong the QT interval (cause an abnormal heart rhythm) -phenytoin -rifampicin -tramadol This list may not describe all possible interactions. Give your health care provider a list of all the medicines, herbs, non-prescription drugs, or dietary supplements you use. Also tell them if you smoke, drink alcohol, or use illegal drugs. Some items may interact with your medicine. What should I watch for while using this medicine? Check with your doctor or health care professional right away if you have any sign of an allergic reaction. What side effects may I notice from receiving this medicine? Side effects that you should report to your doctor or health care professional as soon as possible: -allergic reactions like skin rash, itching or hives, swelling of the face, lips or tongue -breathing problems -confusion -dizziness -fast or irregular heartbeat -feeling faint or lightheaded, falls -fever and chills -loss of balance or coordination -seizures -sweating -swelling of the hands or feet -  tightness in the chest -tremors -unusually weak or tired Side effects that usually do not require medical  attention (report to your doctor or health care professional if they continue or are bothersome): -constipation or diarrhea -headache This list may not describe all possible side effects. Call your doctor for medical advice about side effects. You may report side effects to FDA at 1-800-FDA-1088. Where should I keep my medicine? Keep out of the reach of children. Store between 2 and 30 degrees C (36 and 86 degrees F). Throw away any unused medicine after the expiration date. NOTE: This sheet is a summary. It may not cover all possible information. If you have questions about this medicine, talk to your doctor, pharmacist, or health care provider.  2018 Elsevier/Gold Standard (2012-10-13 16:27:45)  Methylprednisolone tablets What is this medicine? METHYLPREDNISOLONE (meth ill pred NISS oh lone) is a corticosteroid. It is commonly used to treat inflammation of the skin, joints, lungs, and other organs. Common conditions treated include asthma, allergies, and arthritis. It is also used for other conditions, such as blood disorders and diseases of the adrenal glands. This medicine may be used for other purposes; ask your health care provider or pharmacist if you have questions. COMMON BRAND NAME(S): Medrol, Medrol Dosepak What should I tell my health care provider before I take this medicine? They need to know if you have any of these conditions: -Cushing's syndrome -eye disease, vision problems -diabetes -glaucoma -heart disease -high blood pressure -infection (especially a virus infection such as chickenpox, cold sores, or herpes) -liver disease -mental illness -myasthenia gravis -osteoporosis -recently received or scheduled to receive a vaccine -seizures -stomach or intestine problems -thyroid disease -an unusual or allergic reaction to lactose, methylprednisolone, other medicines, foods, dyes, or preservatives -pregnant or trying to get pregnant -breast-feeding How should I use  this medicine? Take this medicine by mouth with a glass of water. Follow the directions on the prescription label. Take this medicine with food. If you are taking this medicine once a day, take it in the morning. Do not take it more often than directed. Do not suddenly stop taking your medicine because you may develop a severe reaction. Your doctor will tell you how much medicine to take. If your doctor wants you to stop the medicine, the dose may be slowly lowered over time to avoid any side effects. Talk to your pediatrician regarding the use of this medicine in children. Special care may be needed. Overdosage: If you think you have taken too much of this medicine contact a poison control center or emergency room at once. NOTE: This medicine is only for you. Do not share this medicine with others. What if I miss a dose? If you miss a dose, take it as soon as you can. If it is almost time for your next dose, talk to your doctor or health care professional. You may need to miss a dose or take an extra dose. Do not take double or extra doses without advice. What may interact with this medicine? Do not take this medicine with any of the following medications: -alefacept -echinacea -live virus vaccines -metyrapone -mifepristone This medicine may also interact with the following medications: -amphotericin B -aspirin and aspirin-like medicines -certain antibiotics like erythromycin, clarithromycin, troleandomycin -certain medicines for diabetes -certain medicines for fungal infections like ketoconazole -certain medicines for seizures like carbamazepine, phenobarbital, phenytoin -certain medicines that treat or prevent blood clots like warfarin -cholestyramine -cyclosporine -digoxin -diuretics -male hormones, like estrogens and birth control pills -isoniazid -  NSAIDs, medicines for pain inflammation, like ibuprofen or naproxen -other medicines for myasthenia gravis -rifampin -vaccines This  list may not describe all possible interactions. Give your health care provider a list of all the medicines, herbs, non-prescription drugs, or dietary supplements you use. Also tell them if you smoke, drink alcohol, or use illegal drugs. Some items may interact with your medicine. What should I watch for while using this medicine? Tell your doctor or healthcare professional if your symptoms do not start to get better or if they get worse. Do not stop taking except on your doctor's advice. You may develop a severe reaction. Your doctor will tell you how much medicine to take. This medicine may increase your risk of getting an infection. Tell your doctor or health care professional if you are around anyone with measles or chickenpox, or if you develop sores or blisters that do not heal properly. This medicine may affect blood sugar levels. If you have diabetes, check with your doctor or health care professional before you change your diet or the dose of your diabetic medicine. Tell your doctor or health care professional right away if you have any change in your eyesight. Using this medicine for a long time may increase your risk of low bone mass. Talk to your doctor about bone health. What side effects may I notice from receiving this medicine? Side effects that you should report to your doctor or health care professional as soon as possible: -allergic reactions like skin rash, itching or hives, swelling of the face, lips, or tongue -bloody or tarry stools -changes in vision -hallucination, loss of contact with reality -muscle cramps -muscle pain -palpitations -signs and symptoms of high blood sugar such as dizziness; dry mouth; dry skin; fruity breath; nausea; stomach pain; increased hunger or thirst; increased urination -signs and symptoms of infection like fever or chills; cough; sore throat; pain or trouble passing urine -trouble passing urine or change in the amount of urine Side effects that  usually do not require medical attention (report to your doctor or health care professional if they continue or are bothersome): -changes in emotions or mood -constipation -diarrhea -excessive hair growth on the face or body -headache -nausea, vomiting -trouble sleeping -weight gain This list may not describe all possible side effects. Call your doctor for medical advice about side effects. You may report side effects to FDA at 1-800-FDA-1088. Where should I keep my medicine? Keep out of the reach of children. Store at room temperature between 20 and 25 degrees C (68 and 77 degrees F). Throw away any unused medicine after the expiration date. NOTE: This sheet is a summary. It may not cover all possible information. If you have questions about this medicine, talk to your doctor, pharmacist, or health care provider.  2018 Elsevier/Gold Standard (2015-03-15 15:53:30)  Rizatriptan tablets What is this medicine? RIZATRIPTAN (rye za TRIP tan) is used to treat migraines with or without aura. An aura is a strange feeling or visual disturbance that warns you of an attack. It is not used to prevent migraines. This medicine may be used for other purposes; ask your health care provider or pharmacist if you have questions. COMMON BRAND NAME(S): Maxalt What should I tell my health care provider before I take this medicine? They need to know if you have any of these conditions: -bowel disease or colitis -diabetes -family history of heart disease -fast or irregular heart beat -heart or blood vessel disease, angina (chest pain), or previous heart attack -high  blood pressure -high cholesterol -history of stroke, transient ischemic attacks (TIAs or mini-strokes), or intracranial bleeding -kidney or liver disease -overweight -poor circulation -postmenopausal or surgical removal of uterus and ovaries -Raynaud's disease -seizure disorder -an unusual or allergic reaction to rizatriptan, other  medicines, foods, dyes, or preservatives -pregnant or trying to get pregnant -breast-feeding How should I use this medicine? This medicine is taken by mouth with a glass of water. Follow the directions on the prescription label. This medicine is taken at the first symptoms of a migraine. It is not for everyday use. If your migraine headache returns after one dose, you can take another dose as directed. You must leave at least 2 hours between doses, and do not take more than 30 mg total in 24 hours. If there is no improvement at all after the first dose, do not take a second dose without talking to your doctor or health care professional. Do not take your medicine more often than directed. Talk to your pediatrician regarding the use of this medicine in children. While this drug may be prescribed for children as young as 6 years for selected conditions, precautions do apply. Overdosage: If you think you have taken too much of this medicine contact a poison control center or emergency room at once. NOTE: This medicine is only for you. Do not share this medicine with others. What if I miss a dose? This does not apply; this medicine is not for regular use. What may interact with this medicine? Do not take this medicine with any of the following medicines: -amphetamine, dextroamphetamine or cocaine -dihydroergotamine, ergotamine, ergoloid mesylates, methysergide, or ergot-type medication - do not take within 24 hours of taking rizatriptan -feverfew -MAOIs like Carbex, Eldepryl, Marplan, Nardil, and Parnate - do not take rizatriptan within 2 weeks of stopping MAOI therapy. -other migraine medicines like almotriptan, eletriptan, naratriptan, sumatriptan, zolmitriptan - do not take within 24 hours of taking rizatriptan -tryptophan This medicine may also interact with the following medications: -medicines for mental depression, anxiety or mood problems -propranolol This list may not describe all possible  interactions. Give your health care provider a list of all the medicines, herbs, non-prescription drugs, or dietary supplements you use. Also tell them if you smoke, drink alcohol, or use illegal drugs. Some items may interact with your medicine. What should I watch for while using this medicine? Only take this medicine for a migraine headache. Take it if you get warning symptoms or at the start of a migraine attack. It is not for regular use to prevent migraine attacks. You may get drowsy or dizzy. Do not drive, use machinery, or do anything that needs mental alertness until you know how this medicine affects you. To reduce dizzy or fainting spells, do not sit or stand up quickly, especially if you are an older patient. Alcohol can increase drowsiness, dizziness and flushing. Avoid alcoholic drinks. Smoking cigarettes may increase the risk of heart-related side effects from using this medicine. If you take migraine medicines for 10 or more days a month, your migraines may get worse. Keep a diary of headache days and medicine use. Contact your healthcare professional if your migraine attacks occur more frequently. What side effects may I notice from receiving this medicine? Side effects that you should report to your doctor or health care professional as soon as possible: -allergic reactions like skin rash, itching or hives, swelling of the face, lips, or tongue -fast, slow, or irregular heart beat -increased or decreased blood pressure -seizures -  severe stomach pain and cramping, bloody diarrhea -signs and symptoms of a blood clot such as breathing problems; changes in vision; chest pain; severe, sudden headache; pain, swelling, warmth in the leg; trouble speaking; sudden numbness or weakness of the face, arm or leg -tingling, pain, or numbness in the face, hands, or feet Side effects that usually do not require medical attention (report to your doctor or health care professional if they continue or  are bothersome): -drowsiness -dry mouth -feeling warm, flushing, or redness of the face -headache -muscle cramps, pain -nausea, vomiting -unusually weak or tired This list may not describe all possible side effects. Call your doctor for medical advice about side effects. You may report side effects to FDA at 1-800-FDA-1088. Where should I keep my medicine? Keep out of the reach of children. Store at room temperature between 15 and 30 degrees C (59 and 86 degrees F). Keep container tightly closed. Throw away any unused medicine after the expiration date. NOTE: This sheet is a summary. It may not cover all possible information. If you have questions about this medicine, talk to your doctor, pharmacist, or health care provider.  2018 Elsevier/Gold Standard (2012-09-07 10:16:39)  Nortriptyline capsules What is this medicine? NORTRIPTYLINE (nor TRIP ti leen) is used to treat depression. This medicine may be used for other purposes; ask your health care provider or pharmacist if you have questions. COMMON BRAND NAME(S): Aventyl, Pamelor What should I tell my health care provider before I take this medicine? They need to know if you have any of these conditions: -an alcohol problem -bipolar disorder or schizophrenia -difficulty passing urine, prostate trouble -glaucoma -heart disease or recent heart attack -liver disease -over active thyroid -seizures -thoughts or plans of suicide or a previous suicide attempt or family history of suicide attempt -an unusual or allergic reaction to nortriptyline, other medicines, foods, dyes, or preservatives -pregnant or trying to get pregnant -breast-feeding How should I use this medicine? Take this medicine by mouth with a glass of water. Follow the directions on the prescription label. Take your doses at regular intervals. Do not take it more often than directed. Do not stop taking this medicine suddenly except upon the advice of your doctor.  Stopping this medicine too quickly may cause serious side effects or your condition may worsen. A special MedGuide will be given to you by the pharmacist with each prescription and refill. Be sure to read this information carefully each time. Talk to your pediatrician regarding the use of this medicine in children. Special care may be needed. Overdosage: If you think you have taken too much of this medicine contact a poison control center or emergency room at once. NOTE: This medicine is only for you. Do not share this medicine with others. What if I miss a dose? If you miss a dose, take it as soon as you can. If it is almost time for your next dose, take only that dose. Do not take double or extra doses. What may interact with this medicine? Do not take this medicine with any of the following medications: -arsenic trioxide -certain medicines medicines for irregular heart beat -cisapride -halofantrine -linezolid -MAOIs like Carbex, Eldepryl, Marplan, Nardil, and Parnate -methylene blue (injected into a vein) -other medicines for mental depression -phenothiazines like perphenazine, thioridazine and chlorpromazine -pimozide -probucol -procarbazine -sparfloxacin -St. John's Wort -ziprasidone This medicine may also interact with any of the following medications: -atropine and related drugs like hyoscyamine, scopolamine, tolterodine and others -barbiturate medicines for inducing sleep or  treating seizures, such as phenobarbital -cimetidine -medicines for diabetes -medicines for seizures like carbamazepine or phenytoin -reserpine -thyroid medicine This list may not describe all possible interactions. Give your health care provider a list of all the medicines, herbs, non-prescription drugs, or dietary supplements you use. Also tell them if you smoke, drink alcohol, or use illegal drugs. Some items may interact with your medicine. What should I watch for while using this medicine? Tell your  doctor if your symptoms do not get better or if they get worse. Visit your doctor or health care professional for regular checks on your progress. Because it may take several weeks to see the full effects of this medicine, it is important to continue your treatment as prescribed by your doctor. Patients and their families should watch out for new or worsening thoughts of suicide or depression. Also watch out for sudden changes in feelings such as feeling anxious, agitated, panicky, irritable, hostile, aggressive, impulsive, severely restless, overly excited and hyperactive, or not being able to sleep. If this happens, especially at the beginning of treatment or after a change in dose, call your health care professional. Michael Lam may get drowsy or dizzy. Do not drive, use machinery, or do anything that needs mental alertness until you know how this medicine affects you. Do not stand or sit up quickly, especially if you are an older patient. This reduces the risk of dizzy or fainting spells. Alcohol may interfere with the effect of this medicine. Avoid alcoholic drinks. Do not treat yourself for coughs, colds, or allergies without asking your doctor or health care professional for advice. Some ingredients can increase possible side effects. Your mouth may get dry. Chewing sugarless gum or sucking hard candy, and drinking plenty of water may help. Contact your doctor if the problem does not go away or is severe. This medicine may cause dry eyes and blurred vision. If you wear contact lenses you may feel some discomfort. Lubricating drops may help. See your eye doctor if the problem does not go away or is severe. This medicine can cause constipation. Try to have a bowel movement at least every 2 to 3 days. If you do not have a bowel movement for 3 days, call your doctor or health care professional. This medicine can make you more sensitive to the sun. Keep out of the sun. If you cannot avoid being in the sun, wear  protective clothing and use sunscreen. Do not use sun lamps or tanning beds/booths. What side effects may I notice from receiving this medicine? Side effects that you should report to your doctor or health care professional as soon as possible: -allergic reactions like skin rash, itching or hives, swelling of the face, lips, or tongue -anxious -breathing problems -changes in vision -confusion -elevated mood, decreased need for sleep, racing thoughts, impulsive behavior -eye pain -fast, irregular heartbeat -feeling faint or lightheaded, falls -feeling agitated, angry, or irritable -fever with increased sweating -hallucination, loss of contact with reality -seizures -stiff muscles -suicidal thoughts or other mood changes -tingling, pain, or numbness in the feet or hands -trouble passing urine or change in the amount of urine -trouble sleeping -unusually weak or tired -vomiting -yellowing of the eyes or skin Side effects that usually do not require medical attention (report to your doctor or health care professional if they continue or are bothersome): -change in sex drive or performance -change in appetite or weight -constipation -dizziness -dry mouth -nausea -tired -tremors -upset stomach This list may not describe all possible  side effects. Call your doctor for medical advice about side effects. You may report side effects to FDA at 1-800-FDA-1088. Where should I keep my medicine? Keep out of the reach of children. Store at room temperature between 15 and 30 degrees C (59 and 86 degrees F). Keep container tightly closed. Throw away any unused medicine after the expiration date. NOTE: This sheet is a summary. It may not cover all possible information. If you have questions about this medicine, talk to your doctor, pharmacist, or health care provider.  2018 Elsevier/Gold Standard (2015-06-08 12:53:08)

## 2017-01-19 ENCOUNTER — Other Ambulatory Visit: Payer: Self-pay

## 2017-01-19 ENCOUNTER — Ambulatory Visit (INDEPENDENT_AMBULATORY_CARE_PROVIDER_SITE_OTHER): Payer: BLUE CROSS/BLUE SHIELD | Admitting: Emergency Medicine

## 2017-01-19 ENCOUNTER — Encounter: Payer: Self-pay | Admitting: Emergency Medicine

## 2017-01-19 VITALS — HR 69 | Temp 99.0°F | Resp 16 | Wt 236.8 lb

## 2017-01-19 DIAGNOSIS — R05 Cough: Secondary | ICD-10-CM | POA: Diagnosis not present

## 2017-01-19 DIAGNOSIS — R059 Cough, unspecified: Secondary | ICD-10-CM

## 2017-01-19 DIAGNOSIS — R053 Chronic cough: Secondary | ICD-10-CM | POA: Insufficient documentation

## 2017-01-19 DIAGNOSIS — M791 Myalgia, unspecified site: Secondary | ICD-10-CM | POA: Diagnosis not present

## 2017-01-19 DIAGNOSIS — J111 Influenza due to unidentified influenza virus with other respiratory manifestations: Secondary | ICD-10-CM | POA: Diagnosis not present

## 2017-01-19 MED ORDER — PSEUDOEPHEDRINE-GUAIFENESIN ER 60-600 MG PO TB12: 1.0000 | ORAL_TABLET | Freq: Two times a day (BID) | ORAL | 1 refills | Status: AC

## 2017-01-19 MED ORDER — AMOXICILLIN-POT CLAVULANATE 875-125 MG PO TABS: 1.0000 | ORAL_TABLET | Freq: Two times a day (BID) | ORAL | 0 refills | Status: AC

## 2017-01-19 MED ORDER — HYDROCODONE-ACETAMINOPHEN 5-325 MG PO TABS: 1.0000 | ORAL_TABLET | Freq: Four times a day (QID) | ORAL | 0 refills | Status: DC | PRN

## 2017-01-19 NOTE — Patient Instructions (Addendum)
     IF you received an x-ray today, you will receive an invoice from [facility]. Please contact  Radiology at [phone] with questions or concerns regarding your invoice.   IF you received labwork today, you will receive an invoice from [facility]. Please contact [facility] at [phone] with questions or concerns regarding your invoice.   Our billing staff will not be able to assist you with questions regarding bills from these companies.  You will be contacted with the lab results as soon as they are available. The fastest way to get your results is to activate your My Chart account. Instructions are located on the last page of this paperwork. If you have not heard from us regarding the results in 2 weeks, please contact this office.      Influenza, Adult Influenza ("the flu") is an infection in the lungs, nose, and throat (respiratory tract). It is caused by a virus. The flu causes many common cold symptoms, as well as a high fever and body aches. It can make you feel very sick. The flu spreads easily from person to person (is contagious). Getting a flu shot (influenza vaccination) every year is the best way to prevent the flu. Follow these instructions at home:  Take over-the-counter and prescription medicines only as told by your doctor.  Use a cool mist humidifier to add moisture (humidity) to the air in your home. This can make it easier to breathe.  Rest as needed.  Drink enough fluid to keep your pee (urine) clear or pale yellow.  Cover your mouth and nose when you cough or sneeze.  Wash your hands with soap and water often, especially after you cough or sneeze. If you cannot use soap and water, use hand sanitizer.  Stay home from work or school as told by your doctor. Unless you are visiting your doctor, try to avoid leaving home until your fever has been gone for 24 hours without the use of medicine.  Keep all follow-up visits as told by your doctor.  This is important. How is this prevented?  Getting a yearly (annual) flu shot is the best way to avoid getting the flu. You may get the flu shot in late summer, fall, or winter. Ask your doctor when you should get your flu shot.  Wash your hands often or use hand sanitizer often.  Avoid contact with people who are sick during cold and flu season.  Eat healthy foods.  Drink plenty of fluids.  Get enough sleep.  Exercise regularly. Contact a doctor if:  You get new symptoms.  You have:  Chest pain.  Watery poop (diarrhea).  A fever.  Your cough gets worse.  You start to have more mucus.  You feel sick to your stomach (nauseous).  You throw up (vomit). Get help right away if:  You start to be short of breath or have trouble breathing.  Your skin or nails turn a bluish color.  You have very bad pain or stiffness in your neck.  You get a sudden headache.  You get sudden pain in your face or ear.  You cannot stop throwing up. This information is not intended to replace advice given to you by your health care provider. Make sure you discuss any questions you have with your health care provider. Document Released: 10/16/2007 Document Revised: 06/14/2015 Document Reviewed: 10/31/2014 Elsevier Interactive Patient Education  2017 Elsevier Inc.  

## 2017-01-19 NOTE — Progress Notes (Signed)
Michael Lam 21 y.o.   Chief Complaint  Patient presents with  . Cough    x 1 week productive light green  . Generalized Body Aches    HISTORY OF PRESENT ILLNESS: This is a 21 y.o. male complaining of acute onset flu-like symptoms last Thursday evening.   Influenza  This is a new problem. The current episode started in the past 7 days. The problem occurs constantly. The problem has been rapidly worsening. Associated symptoms include chest pain, chills, congestion, coughing, fatigue, a fever, headaches, myalgias, nausea, neck pain, a sore throat and weakness. Pertinent negatives include no abdominal pain, anorexia, rash, vertigo, visual change or vomiting. Nothing aggravates the symptoms. Treatments tried: Mucinex. The treatment provided no relief.     Prior to Admission medications   Medication Sig Start Date End Date Taking? Authorizing Provider  cetirizine (ZYRTEC) 10 MG tablet Take 1 tablet (10 mg total) daily by mouth. 11/25/16  Yes Michael BambergMani, Mario, PA-C  fluticasone (FLONASE) 50 MCG/ACT nasal spray Place 2 sprays daily into both nostrils. 11/25/16  Yes Michael BambergMani, Mario, PA-C  hydrOXYzine (VISTARIL) 50 MG capsule Take 1 capsule (50 mg total) by mouth at bedtime as needed. 10/05/15  Yes Michael BambergMani, Mario, PA-C  nortriptyline (PAMELOR) 25 MG capsule Take 1 capsule (25 mg total) by mouth at bedtime. 12/31/16  Yes Michael FretAhern, Antonia B, MD  ondansetron (ZOFRAN) 4 MG tablet Take 1 tablet (4 mg total) by mouth every 8 (eight) hours as needed for nausea or vomiting. 12/31/16  Yes Michael FretAhern, Antonia B, MD  rizatriptan (MAXALT) 10 MG tablet Take 1 tablet (10 mg total) by mouth as needed for migraine. May repeat once in 2 hours if needed 12/31/16  Yes Michael FretAhern, Antonia B, MD  methylPREDNISolone (MEDROL DOSEPAK) 4 MG TBPK tablet follow package directions Patient not taking: Reported on 01/19/2017 12/31/16   Michael FretAhern, Antonia B, MD    Allergies  Allergen Reactions  . Peanut Oil Rash  . Eggs Or Egg-Derived  Products     Other reaction(s): Abdominal Pain    Patient Active Problem List   Diagnosis Date Noted  . Chronic migraine without aura without status migrainosus, not intractable 12/31/2016  . Intractable chronic migraine without aura and without status migrainosus 12/09/2016  . Other headache syndrome 12/09/2016  . Pilonidal cyst with abscess 09/01/2015  . Rash and nonspecific skin eruption 05/25/2015  . Epididymitis 05/25/2015  . Anxiety 04/13/2015    Past Medical History:  Diagnosis Date  . Allergy   . Anxiety   . Asthma     Past Surgical History:  Procedure Laterality Date  . TONSILLECTOMY      Social History   Socioeconomic History  . Marital status: Single    Spouse name: Not on file  . Number of children: 0  . Years of education: Not on file  . Highest education level: Associate degree: academic program  Social Needs  . Financial resource strain: Not on file  . Food insecurity - worry: Not on file  . Food insecurity - inability: Not on file  . Transportation needs - medical: Not on file  . Transportation needs - non-medical: Not on file  Occupational History    Employer: [employer]  Tobacco Use  . Smoking status: Never Smoker  . Smokeless tobacco: Never Used  Substance and Sexual Activity  . Alcohol use: No    Alcohol/week: 0.0 oz  . Drug use: No  . Sexual activity: Not on file  Other Topics Concern  . 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 Hyperlipidemia Father   . Hypertension Father   . Diabetes Father   . Diabetes Maternal Grandmother   . Migraines Maternal Grandmother   . Stroke Maternal Grandfather   . Hypertension Maternal Grandfather   . Stroke Paternal Grandfather      Review of Systems  Constitutional: Positive for chills, fatigue, fever and  malaise/fatigue.  HENT: Positive for congestion and sore throat. Negative for ear pain, nosebleeds and sinus pain.   Eyes: Negative.   Respiratory: Positive for cough. Negative for shortness of breath and wheezing.   Cardiovascular: Positive for chest pain. Negative for palpitations.  Gastrointestinal: Positive for nausea. Negative for abdominal pain, anorexia, blood in stool, diarrhea and vomiting.  Genitourinary: Negative.   Musculoskeletal: Positive for myalgias and neck pain.  Skin: Negative for rash.  Neurological: Positive for weakness and headaches. Negative for dizziness, vertigo, sensory change and focal weakness.  Endo/Heme/Allergies: Negative.   All other systems reviewed and are negative.     Vitals:   01/19/17 1533  Pulse: 69  Resp: 16  Temp: 99 F (37.2 C)  SpO2: 98%    Physical Exam  Constitutional: He is oriented to person, place, and time. He appears well-developed. He appears ill.  HENT:  Head: Normocephalic.  Right Ear: External ear normal.  Left Ear: External ear normal.  Nose: Nose normal.  Mouth/Throat: Oropharynx is clear and moist.  Eyes: Conjunctivae and EOM are normal. Pupils are equal, round, and reactive to light.  Neck: Normal range of motion. Neck supple. No JVD present. No thyromegaly present.  Cardiovascular: Normal rate, regular rhythm, normal heart sounds and intact distal pulses.  Pulmonary/Chest: Effort normal and breath sounds normal.  Abdominal: Soft. Bowel sounds are normal. He exhibits no distension. There is no tenderness.  Musculoskeletal: Normal range of motion. He exhibits no edema.  Lymphadenopathy:    He has no cervical adenopathy.  Neurological: He is alert and oriented to person, place, and time. No sensory deficit. He exhibits normal muscle tone.  Skin: Skin is warm and dry. Capillary refill takes less than 2 seconds. No rash noted.  Psychiatric: He has a normal mood and affect. His behavior is normal.  Vitals  reviewed.    ASSESSMENT & PLAN: Michael Lam was seen today for cough and generalized body aches.  Diagnoses and all orders for this visit:  Influenza with respiratory manifestation -     amoxicillin-clavulanate (AUGMENTIN) 875-125 MG tablet; Take 1 tablet by mouth 2 (two) times daily for 7 days.  Cough -     pseudoephedrine-guaifenesin (MUCINEX D) 60-600 MG 12 hr tablet; Take 1 tablet by mouth every 12 (twelve) hours for 5 days.  Generalized muscle ache -     HYDROcodone-acetaminophen (NORCO) 5-325 MG tablet; Take 1 tablet by mouth every 6 (six) hours as needed for moderate pain.    Patient Instructions       IF you received an x-ray today, you will receive an invoice from [facility]. Please contact [facility] at [phone] with questions or concerns regarding your invoice.   IF you received labwork today, you will receive an invoice from [facility]. Please contact [facility] at [phone] with questions or concerns regarding your invoice.   Our  billing staff will not be able to assist you with questions regarding bills from these companies.  You will be contacted with the lab results as soon as they are available. The fastest way to get your results is to activate your My Chart account. Instructions are located on the last page of this paperwork. If you have not heard from us regarding the results in 2 weeks, please contact this office.     Influenza, Adult Influenza ("the flu") is an infection in the lungs, nose, and throat (respiratory tract). It is caused by a virus. The flu causes many common cold symptoms, as well as a high fever and body aches. It can make you feel very sick. The flu spreads easily from person to person (is contagious). Getting a flu shot (influenza vaccination) every year is the best way to prevent the flu. Follow these instructions at home:  Take over-the-counter and prescription medicines only as told by your doctor.  Use a cool  mist humidifier to add moisture (humidity) to the air in your home. This can make it easier to breathe.  Rest as needed.  Drink enough fluid to keep your pee (urine) clear or pale yellow.  Cover your mouth and nose when you cough or sneeze.  Wash your hands with soap and water often, especially after you cough or sneeze. If you cannot use soap and water, use hand sanitizer.  Stay home from work or school as told by your doctor. Unless you are visiting your doctor, try to avoid leaving home until your fever has been gone for 24 hours without the use of medicine.  Keep all follow-up visits as told by your doctor. This is important. How is this prevented?  Getting a yearly (annual) flu shot is the best way to avoid getting the flu. You may get the flu shot in late summer, fall, or winter. Ask your doctor when you should get your flu shot.  Wash your hands often or use hand sanitizer often.  Avoid contact with people who are sick during cold and flu season.  Eat healthy foods.  Drink plenty of fluids.  Get enough sleep.  Exercise regularly. Contact a doctor if:  You get new symptoms.  You have: ? Chest pain. ? Watery poop (diarrhea). ? A fever.  Your cough gets worse.  You start to have more mucus.  You feel sick to your stomach (nauseous).  You throw up (vomit). Get help right away if:  You start to be short of breath or have trouble breathing.  Your skin or nails turn a bluish color.  You have very bad pain or stiffness in your neck.  You get a sudden headache.  You get sudden pain in your face or ear.  You cannot stop throwing up. This information is not intended to replace advice given to you by your health care provider. Make sure you discuss any questions you have with your health care provider. Document Released: 10/16/2007 Document Revised: 06/14/2015 Document Reviewed: 10/31/2014 Elsevier Interactive Patient Education  2017 Elsevier  Inc.      Edwina BarthMiguel Catricia Scheerer, MD [facility]

## 2017-01-22 ENCOUNTER — Telehealth: Payer: Self-pay | Admitting: Emergency Medicine

## 2017-01-22 NOTE — Telephone Encounter (Signed)
Patient needs FMLA forms completed for his most recent OV for influenza. I have completed the forms based off the OV notes, they just need to be signed I will place the forms in Dr Latrelle DodrillSagardia's box on 01/22/17 please return to the FMLA/Disability box at the 102 check out desk within 5-7 business days. Thank you!

## 2017-01-22 NOTE — Telephone Encounter (Signed)
Pt calling back to see if doctor received FMLA paperwork.

## 2017-01-23 NOTE — Telephone Encounter (Signed)
Paperwork has been received and will be placed in provides box today

## 2017-01-23 NOTE — Telephone Encounter (Signed)
Done. Thanks, Luther Parodyaitlin.

## 2017-01-26 NOTE — Telephone Encounter (Signed)
Paperwork scanned and faxed to [employer] on 01/26/17

## 2017-02-24 ENCOUNTER — Other Ambulatory Visit: Payer: Self-pay

## 2017-02-24 ENCOUNTER — Emergency Department (HOSPITAL_COMMUNITY)
Admission: EM | Admit: 2017-02-24 | Discharge: 2017-02-24 | Disposition: A | Discharge status: Home / Self Care | Payer: BLUE CROSS/BLUE SHIELD | Attending: Emergency Medicine | Admitting: Emergency Medicine

## 2017-02-24 ENCOUNTER — Ambulatory Visit: Payer: BLUE CROSS/BLUE SHIELD | Admitting: Emergency Medicine

## 2017-02-24 ENCOUNTER — Emergency Department (HOSPITAL_COMMUNITY): Payer: BLUE CROSS/BLUE SHIELD

## 2017-02-24 ENCOUNTER — Encounter (HOSPITAL_COMMUNITY): Payer: Self-pay

## 2017-02-24 DIAGNOSIS — R05 Cough: Secondary | ICD-10-CM | POA: Diagnosis not present

## 2017-02-24 DIAGNOSIS — R69 Illness, unspecified: Secondary | ICD-10-CM | POA: Diagnosis not present

## 2017-02-24 DIAGNOSIS — J09X2 Influenza due to identified novel influenza A virus with other respiratory manifestations: Secondary | ICD-10-CM | POA: Diagnosis not present

## 2017-02-24 DIAGNOSIS — R51 Headache: Secondary | ICD-10-CM | POA: Diagnosis not present

## 2017-02-24 DIAGNOSIS — J069 Acute upper respiratory infection, unspecified: Secondary | ICD-10-CM | POA: Diagnosis not present

## 2017-02-24 DIAGNOSIS — R590 Localized enlarged lymph nodes: Secondary | ICD-10-CM | POA: Diagnosis not present

## 2017-02-24 DIAGNOSIS — M7918 Myalgia, other site: Secondary | ICD-10-CM | POA: Insufficient documentation

## 2017-02-24 DIAGNOSIS — R6889 Other general symptoms and signs: Secondary | ICD-10-CM

## 2017-02-24 DIAGNOSIS — J3489 Other specified disorders of nose and nasal sinuses: Secondary | ICD-10-CM | POA: Diagnosis not present

## 2017-02-24 DIAGNOSIS — J45909 Unspecified asthma, uncomplicated: Secondary | ICD-10-CM | POA: Diagnosis not present

## 2017-02-24 DIAGNOSIS — B9789 Other viral agents as the cause of diseases classified elsewhere: Secondary | ICD-10-CM | POA: Diagnosis not present

## 2017-02-24 DIAGNOSIS — R509 Fever, unspecified: Secondary | ICD-10-CM | POA: Diagnosis not present

## 2017-02-24 DIAGNOSIS — J111 Influenza due to unidentified influenza virus with other respiratory manifestations: Secondary | ICD-10-CM | POA: Diagnosis not present

## 2017-02-24 LAB — INFLUENZA PANEL BY PCR (TYPE A & B)
Influenza A By PCR: POSITIVE — AB
Influenza B By PCR: NEGATIVE

## 2017-02-24 MED ORDER — ACETAMINOPHEN 325 MG PO TABS
650.0000 mg | ORAL_TABLET | Freq: Once | ORAL | Status: AC | PRN
  Administered 2017-02-24: 650 mg via ORAL
  Filled 2017-02-24: qty 2

## 2017-02-24 MED ORDER — PROMETHAZINE-DM 6.25-15 MG/5ML PO SYRP: 5.0000 mL | ORAL_SOLUTION | Freq: Four times a day (QID) | ORAL | 0 refills | Status: DC | PRN

## 2017-02-24 NOTE — ED Notes (Signed)
Pt verbalized understanding of d/c instructions and has no further questions. VSS, NAD. Afebrile on d/c.

## 2017-02-24 NOTE — ED Notes (Signed)
Patient reports generalized body ache

## 2017-02-24 NOTE — ED Triage Notes (Signed)
Onset 4 days productive cough-yellow/light green phlegm,  nasal congestion, generalized body aches.   No respiratory distress.

## 2017-02-24 NOTE — ED Notes (Signed)
Fluid offered. Patient drank OJ without incident

## 2017-02-24 NOTE — ED Provider Notes (Signed)
[facility] EMERGENCY DEPARTMENT Provider Note   CSN: [id number] Arrival date & time: 02/24/17  1144     History   Chief Complaint Chief Complaint  Patient presents with  . URI    HPI Macario Shear is a 22 y.o. male with past medical history significant for childhood asthma presenting with 5 days of upper respiratory infection symptoms including rhinorrhea, temperature high of 99.9, productive cough, myalgias, sinus pressure and headache.  He has tried Tylenol and NyQuil with some relief.  Has also tried Mucinex to Sunday with modest relief.  Reports waking up with generalized body aches and burning sensation. He is up-to-date with his flu immunization and also contacted flu earlier this season.  Denies nausea, vomiting, visual disturbances, neck pain or stiffness, chest pain, shortness of breath or other symptoms.  HPI  Past Medical History:  Diagnosis Date  . Allergy   . Anxiety   . Asthma     Patient Active Problem List   Diagnosis Date Noted  . Influenza with respiratory manifestation 01/19/2017  . Cough 01/19/2017  . Generalized muscle ache 01/19/2017  . Chronic migraine without aura without status migrainosus, not intractable 12/31/2016  . Intractable chronic migraine without aura and without status migrainosus 12/09/2016  . Other headache syndrome 12/09/2016  . Pilonidal cyst with abscess 09/01/2015  . Rash and nonspecific skin eruption 05/25/2015  . Epididymitis 05/25/2015  . Anxiety 04/13/2015    Past Surgical History:  Procedure Laterality Date  . TONSILLECTOMY         Home Medications    Prior to Admission medications   Medication Sig Start Date End Date Taking? Authorizing Provider  cetirizine (ZYRTEC) 10 MG tablet Take 1 tablet (10 mg total) daily by mouth. 11/25/16   Wallis Bamberg, PA-C  fluticasone (FLONASE) 50 MCG/ACT nasal spray Place 2 sprays daily into both nostrils. 11/25/16   Wallis Bamberg, PA-C    HYDROcodone-acetaminophen (NORCO) 5-325 MG tablet Take 1 tablet by mouth every 6 (six) hours as needed for moderate pain. 01/19/17   Georgina Quint, MD  hydrOXYzine (VISTARIL) 50 MG capsule Take 1 capsule (50 mg total) by mouth at bedtime as needed. 10/05/15   Wallis Bamberg, PA-C  methylPREDNISolone (MEDROL DOSEPAK) 4 MG TBPK tablet follow package directions Patient not taking: Reported on 01/19/2017 12/31/16   Anson Fret, MD  nortriptyline (PAMELOR) 25 MG capsule Take 1 capsule (25 mg total) by mouth at bedtime. 12/31/16   Anson Fret, MD  ondansetron (ZOFRAN) 4 MG tablet Take 1 tablet (4 mg total) by mouth every 8 (eight) hours as needed for nausea or vomiting. 12/31/16   Anson Fret, MD  promethazine-dextromethorphan (PROMETHAZINE-DM) 6.25-15 MG/5ML syrup Take 5 mLs by mouth 4 (four) times daily as needed for cough. 02/24/17   Mathews Robinsons B, PA-C  rizatriptan (MAXALT) 10 MG tablet Take 1 tablet (10 mg total) by mouth as needed for migraine. May repeat once in 2 hours if needed 12/31/16   Anson Fret, MD    Family History Family History  Problem Relation Age of Onset  . Hyperlipidemia Father   . Hypertension Father   . Diabetes Father   . Diabetes Maternal Grandmother   . Migraines Maternal Grandmother   . Stroke Maternal Grandfather   . Hypertension Maternal Grandfather   . Stroke Paternal Grandfather     Social History Social History   Tobacco Use  . Smoking status: Never Smoker  . Smokeless tobacco: Never Used  Substance Use  Topics  . Alcohol use: No    Alcohol/week: 0.0 oz  . Drug use: No     Allergies   Peanut oil and Eggs or egg-derived products   Review of Systems Review of Systems  Constitutional: Positive for fever. Negative for chills and diaphoresis.  HENT: Positive for congestion, rhinorrhea, sinus pressure and sinus pain. Negative for ear discharge, ear pain, sore throat, tinnitus, trouble swallowing and voice change.   Eyes:  Negative for photophobia, pain, redness and visual disturbance.  Respiratory: Positive for cough. Negative for chest tightness, shortness of breath and stridor.   Cardiovascular: Negative for chest pain, palpitations and leg swelling.  Gastrointestinal: Negative for abdominal distention, abdominal pain, diarrhea, nausea and vomiting.  Genitourinary: Negative for difficulty urinating, dysuria, flank pain and frequency.  Musculoskeletal: Positive for myalgias. Negative for arthralgias, back pain, gait problem, joint swelling, neck pain and neck stiffness.  Skin: Negative for color change, pallor and rash.  Neurological: Positive for headaches. Negative for dizziness, tremors, seizures, syncope, facial asymmetry, speech difficulty, weakness, light-headedness and numbness.     Physical Exam Updated Vital Signs BP 129/83 (BP Location: Right Arm)   Pulse 97   Temp 98.5 F (36.9 C) (Oral)   Resp 18   SpO2 98%   Physical Exam  Constitutional: He appears well-developed and well-nourished. No distress.  Febrile on arrival at 101.7, nontoxic sitting comfortably in chair in no acute distress.  HENT:  Head: Normocephalic and atraumatic.  Right Ear: External ear normal.  Left Ear: External ear normal.  Nose: Nose normal.  Mouth/Throat: Oropharynx is clear and moist. No oropharyngeal exudate.  Oropharynx is clear without exudate.  Normal tympanic membranes bilaterally.  Eyes: Conjunctivae and EOM are normal. Right eye exhibits no discharge. Left eye exhibits no discharge. No scleral icterus.  Neck: Normal range of motion. Neck supple.  No meningeal signs  Cardiovascular: Normal rate, regular rhythm, normal heart sounds and intact distal pulses.  No murmur heard. Pulmonary/Chest: Effort normal and breath sounds normal. No stridor. No respiratory distress. He has no wheezes. He has no rales.  Abdominal: He exhibits no distension.  Musculoskeletal: Normal range of motion. He exhibits no edema.    Lymphadenopathy:    He has cervical adenopathy.  Neurological: He is alert. No sensory deficit. He exhibits normal muscle tone.  Skin: Skin is warm and dry. No rash noted. He is not diaphoretic. No erythema. No pallor.  Psychiatric: He has a normal mood and affect.  Nursing note and vitals reviewed.    ED Treatments / Results  Labs (all labs ordered are listed, but only abnormal results are displayed) Labs Reviewed  INFLUENZA PANEL BY PCR (TYPE A & B)    EKG  EKG Interpretation None       Radiology Dg Chest 2 View  Result Date: 02/24/2017 CLINICAL DATA:  Cough, chest tightness and fever for 2 days. EXAM: CHEST  2 VIEW COMPARISON:  11/20/2016 chest radiograph FINDINGS: The cardiomediastinal silhouette is unremarkable. There is no evidence of focal airspace disease, pulmonary edema, suspicious pulmonary nodule/mass, pleural effusion, or pneumothorax. No acute bony abnormalities are identified. IMPRESSION: No active cardiopulmonary disease. Electronically Signed   By: Harmon PierJeffrey  Hu M.D.   On: 02/24/2017 14:49    Procedures Procedures (including critical care time)  Medications Ordered in ED Medications  acetaminophen (TYLENOL) tablet 650 mg (650 mg Oral Given 02/24/17 1404)     Initial Impression / Assessment and Plan / ED Course  I have reviewed the triage vital  signs and the nursing notes.  Pertinent labs & imaging results that were available during my care of the patient were reviewed by me and considered in my medical decision making (see chart for details).     Pt CXR negative for acute infiltrate. Patients symptoms are consistent with URI, likely viral etiology. Discussed that antibiotics are not indicated for viral infections.  Pt is hemodynamically stable & in NAD prior to dc. Patient is outside of the 48 hour window for tamiflu. Flu PCR pending.  Resolution of fever and tachycardia in the emergency department.  Patient overall improved.  Tolerating p.o. and  maintaining hydration.  Will discharge home with symptomatic relief and close follow-up with PCP.  Return precautions discussed and understood and patient agrees with discharge plan. Final Clinical Impressions(s) / ED Diagnoses   Final diagnoses:  Flu-like symptoms  Viral URI with cough    ED Discharge Orders        Ordered    promethazine-dextromethorphan (PROMETHAZINE-DM) 6.25-15 MG/5ML syrup  4 times daily PRN     02/24/17 1548       Gregary Cromer 02/24/17 1704    Maia Plan, MD 02/24/17 [phone]

## 2017-02-24 NOTE — Discharge Instructions (Addendum)
As discussed, your chest xray was negative for any acute cardiopulmonary disease, no pneumonia. Take cough medication as needed. Do not drive while taking this medication as it may make you drowsy. Take ibuprofen or tylenol as needed for pain and fever.  Make sure to stay well-hydrated.  Follow up with your primary care provider. Return if symptoms worsen or new concerning symptoms in the meantime.

## 2017-04-06 ENCOUNTER — Emergency Department (HOSPITAL_COMMUNITY)
Admission: EM | Admit: 2017-04-06 | Discharge: 2017-04-06 | Disposition: A | Discharge status: Home / Self Care | Payer: BLUE CROSS/BLUE SHIELD | Attending: Emergency Medicine | Admitting: Emergency Medicine

## 2017-04-06 ENCOUNTER — Encounter (HOSPITAL_COMMUNITY): Payer: Self-pay | Admitting: Emergency Medicine

## 2017-04-06 ENCOUNTER — Other Ambulatory Visit: Payer: Self-pay

## 2017-04-06 DIAGNOSIS — Z91010 Allergy to peanuts: Secondary | ICD-10-CM | POA: Insufficient documentation

## 2017-04-06 DIAGNOSIS — L0591 Pilonidal cyst without abscess: Secondary | ICD-10-CM | POA: Diagnosis not present

## 2017-04-06 DIAGNOSIS — L0501 Pilonidal cyst with abscess: Secondary | ICD-10-CM

## 2017-04-06 DIAGNOSIS — J45909 Unspecified asthma, uncomplicated: Secondary | ICD-10-CM | POA: Diagnosis not present

## 2017-04-06 DIAGNOSIS — Z79899 Other long term (current) drug therapy: Secondary | ICD-10-CM | POA: Insufficient documentation

## 2017-04-06 DIAGNOSIS — M791 Myalgia, unspecified site: Secondary | ICD-10-CM

## 2017-04-06 MED ORDER — LIDOCAINE-EPINEPHRINE (PF) 2 %-1:200000 IJ SOLN
10.0000 mL | Freq: Once | INTRAMUSCULAR | Status: AC
  Administered 2017-04-06: 10 mL
  Filled 2017-04-06: qty 20

## 2017-04-06 MED ORDER — IBUPROFEN 600 MG PO TABS: 600.0000 mg | ORAL_TABLET | Freq: Four times a day (QID) | ORAL | 0 refills | Status: DC | PRN

## 2017-04-06 MED ORDER — HYDROCODONE-ACETAMINOPHEN 5-325 MG PO TABS: 1.0000 | ORAL_TABLET | Freq: Four times a day (QID) | ORAL | 0 refills | Status: DC | PRN

## 2017-04-06 MED ORDER — OXYCODONE-ACETAMINOPHEN 5-325 MG PO TABS
1.0000 | ORAL_TABLET | ORAL | Status: DC | PRN
  Administered 2017-04-06: 1 via ORAL
  Filled 2017-04-06: qty 1

## 2017-04-06 NOTE — ED Provider Notes (Signed)
[facility] EMERGENCY DEPARTMENT Provider Note   CSN: [id number] Arrival date & time: 04/06/17  0051     History   Chief Complaint Chief Complaint  Patient presents with  . pilonidal cyst    HPI Michael Lam is a 22 y.o. male.  Patient presents with painful swelling on the left upper buttock similar to previous abscesses he has had. He reports 2 in the same area, the last one 2 years ago. No fever.    The history is provided by the patient. No language interpreter was used.    Past Medical History:  Diagnosis Date  . Allergy   . Anxiety   . Asthma     Patient Active Problem List   Diagnosis Date Noted  . Influenza with respiratory manifestation 01/19/2017  . Cough 01/19/2017  . Generalized muscle ache 01/19/2017  . Chronic migraine without aura without status migrainosus, not intractable 12/31/2016  . Intractable chronic migraine without aura and without status migrainosus 12/09/2016  . Other headache syndrome 12/09/2016  . Pilonidal cyst with abscess 09/01/2015  . Rash and nonspecific skin eruption 05/25/2015  . Epididymitis 05/25/2015  . Anxiety 04/13/2015    Past Surgical History:  Procedure Laterality Date  . TONSILLECTOMY         Home Medications    Prior to Admission medications   Medication Sig Start Date End Date Taking? Authorizing Provider  cetirizine (ZYRTEC) 10 MG tablet Take 1 tablet (10 mg total) daily by mouth. 11/25/16   Wallis Bamberg, PA-C  fluticasone (FLONASE) 50 MCG/ACT nasal spray Place 2 sprays daily into both nostrils. 11/25/16   Wallis Bamberg, PA-C  HYDROcodone-acetaminophen (NORCO) 5-325 MG tablet Take 1 tablet by mouth every 6 (six) hours as needed for moderate pain. 01/19/17   Georgina Quint, MD  hydrOXYzine (VISTARIL) 50 MG capsule Take 1 capsule (50 mg total) by mouth at bedtime as needed. 10/05/15   Wallis Bamberg, PA-C  methylPREDNISolone (MEDROL DOSEPAK) 4 MG TBPK tablet follow package  directions Patient not taking: Reported on 01/19/2017 12/31/16   Anson Fret, MD  nortriptyline (PAMELOR) 25 MG capsule Take 1 capsule (25 mg total) by mouth at bedtime. 12/31/16   Anson Fret, MD  ondansetron (ZOFRAN) 4 MG tablet Take 1 tablet (4 mg total) by mouth every 8 (eight) hours as needed for nausea or vomiting. 12/31/16   Anson Fret, MD  promethazine-dextromethorphan (PROMETHAZINE-DM) 6.25-15 MG/5ML syrup Take 5 mLs by mouth 4 (four) times daily as needed for cough. 02/24/17   Mathews Robinsons B, PA-C  rizatriptan (MAXALT) 10 MG tablet Take 1 tablet (10 mg total) by mouth as needed for migraine. May repeat once in 2 hours if needed 12/31/16   Anson Fret, MD    Family History Family History  Problem Relation Age of Onset  . Hyperlipidemia Father   . Hypertension Father   . Diabetes Father   . Diabetes Maternal Grandmother   . Migraines Maternal Grandmother   . Stroke Maternal Grandfather   . Hypertension Maternal Grandfather   . Stroke Paternal Grandfather     Social History Social History   Tobacco Use  . Smoking status: Never Smoker  . Smokeless tobacco: Never Used  Substance Use Topics  . Alcohol use: No    Alcohol/week: 0.0 oz  . Drug use: No     Allergies   Peanut oil and Eggs or egg-derived products   Review of Systems Review of Systems  Constitutional: Negative for fever.  Gastrointestinal: Negative for nausea.  Skin: Positive for wound.       See HPI.     Physical Exam Updated Vital Signs BP 132/82 (BP Location: Right Arm)   Pulse 83   Temp 98.2 F (36.8 C) (Oral)   Resp 18   Ht 5\' 11"  (1.803 m)   Wt 103.4 kg (228 lb)   SpO2 99%   BMI 31.80 kg/m   Physical Exam  Constitutional: He is oriented to person, place, and time. He appears well-developed and well-nourished.  Neck: Normal range of motion.  Pulmonary/Chest: Effort normal.  Musculoskeletal: Normal range of motion.  Neurological: He is alert and oriented to  person, place, and time.  Skin: Skin is warm and dry.  Red, indurated area left pilonidal region with central pustule. No active drainage. Significantly tender.   Psychiatric: He has a normal mood and affect.     ED Treatments / Results  Labs (all labs ordered are listed, but only abnormal results are displayed) Labs Reviewed - No data to display  EKG  EKG Interpretation None       Radiology No results found.  Procedures .Marland Kitchen.Incision and Drainage Date/Time: 04/06/2017 4:40 AM Performed by: Elpidio AnisUpstill, Ronita Hargreaves, PA-C Authorized by: Elpidio AnisUpstill, Lenox Ladouceur, PA-C   Consent:    Consent obtained:  Verbal   Consent given by:  Patient Location:    Type:  Pilonidal cyst Pre-procedure details:    Skin preparation:  Betadine Anesthesia (see MAR for exact dosages):    Anesthesia method:  Local infiltration   Local anesthetic:  Lidocaine 2% WITH epi Procedure type:    Complexity:  Simple Procedure details:    Needle aspiration: no     Incision types:  Single straight   Scalpel blade:  11   Drainage:  Purulent   Drainage amount:  Copious   Wound treatment:  Wound left open   Packing materials:  1/4 in gauze Post-procedure details:    Patient tolerance of procedure:  Tolerated well, no immediate complications   (including critical care time)  Medications Ordered in ED Medications  oxyCODONE-acetaminophen (PERCOCET/ROXICET) 5-325 MG per tablet 1 tablet (1 tablet Oral Given 04/06/17 0207)  lidocaine-EPINEPHrine (XYLOCAINE W/EPI) 2 %-1:200000 (PF) injection 10 mL (not administered)     Initial Impression / Assessment and Plan / ED Course  I have reviewed the triage vital signs and the nursing notes.  Pertinent labs & imaging results that were available during my care of the patient were reviewed by me and considered in my medical decision making (see chart for details).     Patient presents with recurrent pilonidal abscess that was successfully opened and drained. Recommended 2 day  recheck for packing removal. He will follow up with his primary care.   Final Clinical Impressions(s) / ED Diagnoses   Final diagnoses:  None   1. Pilonidal abscess  ED Discharge Orders    None       Elpidio AnisUpstill, Whitten Andreoni, PA-C 04/06/17 0445    Ward, Layla MawKristen N, DO 04/06/17 [phone]

## 2017-04-06 NOTE — ED Triage Notes (Signed)
Reports history of pilonidal cyst.  Noticed a month ago but has gotten worse the last few days.  Very uncomfortable in triage.

## 2017-04-06 NOTE — ED Notes (Signed)
ED Provider at bedside. 

## 2017-04-07 ENCOUNTER — Ambulatory Visit (INDEPENDENT_AMBULATORY_CARE_PROVIDER_SITE_OTHER): Payer: 59 | Admitting: Emergency Medicine

## 2017-04-07 ENCOUNTER — Encounter: Payer: Self-pay | Admitting: Emergency Medicine

## 2017-04-07 ENCOUNTER — Ambulatory Visit: Payer: BLUE CROSS/BLUE SHIELD | Admitting: Emergency Medicine

## 2017-04-07 VITALS — BP 121/74 | HR 67 | Temp 98.6°F | Resp 17 | Ht 70.5 in | Wt 233.0 lb

## 2017-04-07 DIAGNOSIS — L0501 Pilonidal cyst with abscess: Secondary | ICD-10-CM

## 2017-04-07 NOTE — Progress Notes (Signed)
Michael Lam 21 y.o.   Chief Complaint  Patient presents with  . Follow-up    abcess     HISTORY OF PRESENT ILLNESS: This is a 22 y.o. male here for follow-up of pilonidal abscess.  Seen in the ED 2 days ago.  Had an I&D done.  Doing well.  Feels better.  HPI   Prior to Admission medications   Medication Sig Start Date End Date Taking? Authorizing Provider  cetirizine (ZYRTEC) 10 MG tablet Take 1 tablet (10 mg total) daily by mouth. Patient not taking: Reported on 04/07/2017 11/25/16   Wallis Bamberg, PA-C  fluticasone [facility]) 50 MCG/ACT nasal spray Place 2 sprays daily into both nostrils. Patient not taking: Reported on 04/07/2017 11/25/16   Wallis Bamberg, PA-C  HYDROcodone-acetaminophen [facility]) 5-325 MG tablet Take 1 tablet by mouth every 6 (six) hours as needed for moderate pain. Patient not taking: Reported on 04/07/2017 04/06/17   Elpidio Anis, PA-C  hydrOXYzine (VISTARIL) 50 MG capsule Take 1 capsule (50 mg total) by mouth at bedtime as needed. Patient not taking: Reported on 04/07/2017 10/05/15   Wallis Bamberg, PA-C  ibuprofen (ADVIL,MOTRIN) 600 MG tablet Take 1 tablet (600 mg total) by mouth every 6 (six) hours as needed. Patient not taking: Reported on 04/07/2017 04/06/17   Elpidio Anis, PA-C  methylPREDNISolone (MEDROL DOSEPAK) 4 MG TBPK tablet follow package directions Patient not taking: Reported on 01/19/2017 12/31/16   Anson Fret, MD  nortriptyline (PAMELOR) 25 MG capsule Take 1 capsule (25 mg total) by mouth at bedtime. Patient not taking: Reported on 04/07/2017 12/31/16   Anson Fret, MD  ondansetron (ZOFRAN) 4 MG tablet Take 1 tablet (4 mg total) by mouth every 8 (eight) hours as needed for nausea or vomiting. Patient not taking: Reported on 04/07/2017 12/31/16   Anson Fret, MD  promethazine-dextromethorphan (PROMETHAZINE-DM) 6.25-15 MG/5ML syrup Take 5 mLs by mouth 4 (four) times daily as needed for cough. Patient not taking: Reported on  04/07/2017 02/24/17   Mathews Robinsons B, PA-C  rizatriptan (MAXALT) 10 MG tablet Take 1 tablet (10 mg total) by mouth as needed for migraine. May repeat once in 2 hours if needed Patient not taking: Reported on 04/07/2017 12/31/16   Anson Fret, MD    Allergies  Allergen Reactions  . Peanut Oil Rash  . Eggs Or Egg-Derived Products     Other reaction(s): Abdominal Pain    Patient Active Problem List   Diagnosis Date Noted  . Influenza with respiratory manifestation 01/19/2017  . Cough 01/19/2017  . Generalized muscle ache 01/19/2017  . Chronic migraine without aura without status migrainosus, not intractable 12/31/2016  . Intractable chronic migraine without aura and without status migrainosus 12/09/2016  . Other headache syndrome 12/09/2016  . Pilonidal cyst with abscess 09/01/2015  . Rash and nonspecific skin eruption 05/25/2015  . Epididymitis 05/25/2015  . Anxiety 04/13/2015    Past Medical History:  Diagnosis Date  . Allergy   . Anxiety   . Asthma     Past Surgical History:  Procedure Laterality Date  . TONSILLECTOMY      Social History   Socioeconomic History  . Marital status: Single    Spouse name: Not on file  . Number of children: 0  . Years of education: Not on file  . Highest education level: Associate degree: academic program  Social Needs  . Financial resource strain: Not on file  . Food insecurity - worry: Not on file  . Food insecurity -  inability: Not on file  . Transportation needs - medical: Not on file  . Transportation needs - non-medical: Not on file  Occupational History    Employer: [employer]  Tobacco Use  . Smoking status: Never Smoker  . Smokeless tobacco: Never Used  Substance and Sexual Activity  . Alcohol use: No    Alcohol/week: 0.0 oz  . Drug use: No  . Sexual activity: Not on file  Other Topics Concern  . 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 Hyperlipidemia Father   . Hypertension Father   . Diabetes Father   . Diabetes Maternal Grandmother   . Migraines Maternal Grandmother   . Stroke Maternal Grandfather   . Hypertension Maternal Grandfather   . Stroke Paternal Grandfather      ROS   Physical Exam  Constitutional: He is oriented to person, place, and time. He appears well-developed and well-nourished.  HENT:  Head: Normocephalic and atraumatic.  Eyes: EOM are normal. Pupils are equal, round, and reactive to light.  Neck: Normal range of motion. Neck supple.  Cardiovascular: Normal rate and regular rhythm.  Pulmonary/Chest: Effort normal and breath sounds normal.  Musculoskeletal: Normal range of motion.  Neurological: He is alert and oriented to person, place, and time. No sensory deficit. He exhibits normal muscle tone.  Skin: Skin is warm and dry. Capillary refill takes less than 2 seconds.  Pilonidal cyst on the left.  Packing in place.  No significant erythema.  Still tender to palpation.  Packing removed.  Still some purulent drainage observed.  1% lidocaine with epinephrine injected to the area.  Incision extended, cyst probed, and repacked without any complications.  Psychiatric: He has a normal mood and affect. His behavior is normal.  Vitals reviewed.    ASSESSMENT & PLAN: Michael Lam was seen today for follow-up.  Diagnoses and all orders for this visit:  Pilonidal cyst with abscess  Follow-up in 3 days.  Patient Instructions       IF you received an x-ray today, you will receive an invoice from [facility]. Please contact [facility] at [phone] with questions or concerns regarding your invoice.   IF you received labwork today, you will receive an invoice from [facility]. Please contact [facility] at [phone] with questions or concerns regarding your invoice.   Our  billing staff will not be able to assist you with questions regarding bills from these companies.  You will be contacted with the lab results as soon as they are available. The fastest way to get your results is to activate your My Chart account. Instructions are located on the last page of this paperwork. If you have not heard from [place] regarding the results in 2 weeks, please contact this office.     Pilonidal Cyst A pilonidal cyst is a fluid-filled sac. It forms beneath the skin near your tailbone, at the top of the crease of your buttocks. A pilonidal cyst that is not large or infected may not cause symptoms or problems. If the cyst becomes irritated or infected, it may fill with pus. This causes pain and swelling (pilonidal abscess). An infected cyst may need to be treated with medicine, drained, or removed. What are the causes? The cause of a  pilonidal cyst is not known. One cause may be a hair that grows into your skin (ingrown hair). What increases the risk? Pilonidal cysts are more common in boys and men. Risk factors include:  Having lots of hair near the crease of the buttocks.  Being overweight.  Having a pilonidal dimple.  Wearing tight clothing.  Not bathing or showering frequently.  Sitting for long periods of time.  What are the signs or symptoms? Signs and symptoms of a pilonidal cyst may include:  Redness.  Pain and tenderness.  Warmth.  Swelling.  Pus.  Fever.  How is this diagnosed? Your health care provider may diagnose a pilonidal cyst based on your symptoms and a physical exam. The health care provider may do a blood test to check for infection. If your cyst is draining pus, your health care provider may take a sample of the drainage to be tested at a laboratory. How is this treated? Surgery is the usual treatment for an infected pilonidal cyst. You may also have to take medicines before surgery. The type of surgery you have depends on the size and  severity of the infected cyst. The different kinds of surgery include:  Incision and drainage. This is a procedure to open and drain the cyst.  Marsupialization. In this procedure, a large cyst or abscess may be opened and kept open by stitching the edges of the skin to the cyst walls.  Cyst removal. This procedure involves opening the skin and removing all or part of the cyst.  Follow these instructions at home:  Follow all of your surgeon's instructions carefully if you had surgery.  Take medicines only as directed by your health care provider.  If you were prescribed an antibiotic medicine, finish it all even if you start to feel better.  Keep the area around your pilonidal cyst clean and dry.  Clean the area as directed by your health care provider. Pat the area dry with a clean towel. Do not rub it as this may cause bleeding.  Remove hair from the area around the cyst as directed by your health care provider.  Do not wear tight clothing or sit in one place for long periods of time.  There are many different ways to close and cover an incision, including stitches, skin glue, and adhesive strips. Follow your health care provider's instructions on: ? Incision care. ? Bandage (dressing) changes and removal. ? Incision closure removal. Contact a health care provider if:  You have drainage, redness, swelling, or pain at the site of the cyst.  You have a fever. This information is not intended to replace advice given to you by your health care provider. Make sure you discuss any questions you have with your health care provider. Document Released: 01/04/2000 Document Revised: 06/14/2015 Document Reviewed: 05/26/2013 Elsevier Interactive Patient Education  2018 Elsevier Inc.  Incision and Drainage, Care After Refer to this sheet in the next few weeks. These instructions provide you with information about caring for yourself after your procedure. Your health care provider may also  give you more specific instructions. Your treatment has been planned according to current medical practices, but problems sometimes occur. Call your health care provider if you have any problems or questions after your procedure. What can I expect after the procedure? After the procedure, it is common to have:  Pain or discomfort around your incision site.  Drainage from your incision.  Follow these instructions at home:  Take over-the-counter and prescription medicines only  as told by your health care provider.  If you were prescribed an antibiotic medicine, take it as told by your health care provider.Do not stop taking the antibiotic even if you start to feel better.  Followinstructions from your health care provider about: ? How to take care of your incision. ? When and how you should change your packing and bandage (dressing). Wash your hands with soap and water before you change your dressing. If soap and water are not available, use hand sanitizer. ? When you should remove your dressing.  Do not take baths, swim, or use a hot tub until your health care provider approves.  Keep all follow-up visits as told by your health care provider. This is important.  Check your incision area every day for signs of infection. Check for: ? More redness, swelling, or pain. ? More fluid or blood. ? Warmth. ? Pus or a bad smell. Contact a health care provider if:  Your cyst or abscess returns.  You have a fever.  You have more redness, swelling, or pain around your incision.  You have more fluid or blood coming from your incision.  Your incision feels warm to the touch.  You have pus or a bad smell coming from your incision. Get help right away if:  You have severe pain or bleeding.  You cannot eat or drink without vomiting.  You have decreased urine output.  You become short of breath.  You have chest pain.  You cough up blood.  The area where the incision and drainage  occurred becomes numb or it tingles. This information is not intended to replace advice given to you by your health care provider. Make sure you discuss any questions you have with your health care provider. Document Released: 03/31/2011 Document Revised: 06/08/2015 Document Reviewed: 10/27/2014 Elsevier Interactive Patient Education  2018 Elsevier Inc.       Edwina Barth, MD [facility]

## 2017-04-07 NOTE — Patient Instructions (Addendum)
IF you received an x-ray today, you will receive an invoice from [facility]. Please contact [facility] at [phone] with questions or concerns regarding your invoice.   IF you received labwork today, you will receive an invoice from [place]. Please contact [facility] at [phone] with questions or concerns regarding your invoice.   Our billing staff will not be able to assist you with questions regarding bills from these companies.  You will be contacted with the lab results as soon as they are available. The fastest way to get your results is to activate your My Chart account. Instructions are located on the last page of this paperwork. If you have not heard from [place] regarding the results in 2 weeks, please contact this office.     Pilonidal Cyst A pilonidal cyst is a fluid-filled sac. It forms beneath the skin near your tailbone, at the top of the crease of your buttocks. A pilonidal cyst that is not large or infected may not cause symptoms or problems. If the cyst becomes irritated or infected, it may fill with pus. This causes pain and swelling (pilonidal abscess). An infected cyst may need to be treated with medicine, drained, or removed. What are the causes? The cause of a pilonidal cyst is not known. One cause may be a hair that grows into your skin (ingrown hair). What increases the risk? Pilonidal cysts are more common in boys and men. Risk factors include:  Having lots of hair near the crease of the buttocks.  Being overweight.  Having a pilonidal dimple.  Wearing tight clothing.  Not bathing or showering frequently.  Sitting for long periods of time.  What are the signs or symptoms? Signs and symptoms of a pilonidal cyst may include:  Redness.  Pain and tenderness.  Warmth.  Swelling.  Pus.  Fever.  How is this diagnosed? Your health care provider may diagnose a pilonidal cyst based on your symptoms and a physical exam. The  health care provider may do a blood test to check for infection. If your cyst is draining pus, your health care provider may take a sample of the drainage to be tested at a laboratory. How is this treated? Surgery is the usual treatment for an infected pilonidal cyst. You may also have to take medicines before surgery. The type of surgery you have depends on the size and severity of the infected cyst. The different kinds of surgery include:  Incision and drainage. This is a procedure to open and drain the cyst.  Marsupialization. In this procedure, a large cyst or abscess may be opened and kept open by stitching the edges of the skin to the cyst walls.  Cyst removal. This procedure involves opening the skin and removing all or part of the cyst.  Follow these instructions at home:  Follow all of your surgeon's instructions carefully if you had surgery.  Take medicines only as directed by your health care provider.  If you were prescribed an antibiotic medicine, finish it all even if you start to feel better.  Keep the area around your pilonidal cyst clean and dry.  Clean the area as directed by your health care provider. Pat the area dry with a clean towel. Do not rub it as this may cause bleeding.  Remove hair from the area around the cyst as directed by your health care provider.  Do not wear tight clothing or sit in one place for long periods of time.  There are many  different ways to close and cover an incision, including stitches, skin glue, and adhesive strips. Follow your health care provider's instructions on: ? Incision care. ? Bandage (dressing) changes and removal. ? Incision closure removal. Contact a health care provider if:  You have drainage, redness, swelling, or pain at the site of the cyst.  You have a fever. This information is not intended to replace advice given to you by your health care provider. Make sure you discuss any questions you have with your health  care provider. Document Released: 01/04/2000 Document Revised: 06/14/2015 Document Reviewed: 05/26/2013 Elsevier Interactive Patient Education  2018 Elsevier Inc.  Incision and Drainage, Care After Refer to this sheet in the next few weeks. These instructions provide you with information about caring for yourself after your procedure. Your health care provider may also give you more specific instructions. Your treatment has been planned according to current medical practices, but problems sometimes occur. Call your health care provider if you have any problems or questions after your procedure. What can I expect after the procedure? After the procedure, it is common to have:  Pain or discomfort around your incision site.  Drainage from your incision.  Follow these instructions at home:  Take over-the-counter and prescription medicines only as told by your health care provider.  If you were prescribed an antibiotic medicine, take it as told by your health care provider.Do not stop taking the antibiotic even if you start to feel better.  Followinstructions from your health care provider about: ? How to take care of your incision. ? When and how you should change your packing and bandage (dressing). Wash your hands with soap and water before you change your dressing. If soap and water are not available, use hand sanitizer. ? When you should remove your dressing.  Do not take baths, swim, or use a hot tub until your health care provider approves.  Keep all follow-up visits as told by your health care provider. This is important.  Check your incision area every day for signs of infection. Check for: ? More redness, swelling, or pain. ? More fluid or blood. ? Warmth. ? Pus or a bad smell. Contact a health care provider if:  Your cyst or abscess returns.  You have a fever.  You have more redness, swelling, or pain around your incision.  You have more fluid or blood coming from  your incision.  Your incision feels warm to the touch.  You have pus or a bad smell coming from your incision. Get help right away if:  You have severe pain or bleeding.  You cannot eat or drink without vomiting.  You have decreased urine output.  You become short of breath.  You have chest pain.  You cough up blood.  The area where the incision and drainage occurred becomes numb or it tingles. This information is not intended to replace advice given to you by your health care provider. Make sure you discuss any questions you have with your health care provider. Document Released: 03/31/2011 Document Revised: 06/08/2015 Document Reviewed: 10/27/2014 Elsevier Interactive Patient Education  [employer]2018 Elsevier Inc.

## 2017-04-10 ENCOUNTER — Ambulatory Visit (INDEPENDENT_AMBULATORY_CARE_PROVIDER_SITE_OTHER): Payer: 59 | Admitting: Emergency Medicine

## 2017-04-10 ENCOUNTER — Encounter: Payer: Self-pay | Admitting: Emergency Medicine

## 2017-04-10 ENCOUNTER — Other Ambulatory Visit: Payer: Self-pay

## 2017-04-10 VITALS — BP 100/67 | HR 86 | Temp 98.4°F | Resp 16 | Wt 229.6 lb

## 2017-04-10 DIAGNOSIS — L0501 Pilonidal cyst with abscess: Secondary | ICD-10-CM | POA: Diagnosis not present

## 2017-04-10 MED ORDER — CETIRIZINE HCL 10 MG PO TABS: 10.0000 mg | ORAL_TABLET | Freq: Every day | ORAL | 11 refills | Status: DC

## 2017-04-10 MED ORDER — SULFAMETHOXAZOLE-TRIMETHOPRIM 800-160 MG PO TABS: 1.0000 | ORAL_TABLET | Freq: Two times a day (BID) | ORAL | 0 refills | Status: AC

## 2017-04-10 NOTE — Progress Notes (Signed)
Michael Lam 21 y.o.   Chief Complaint  Patient presents with  . wound care    abcess    HISTORY OF PRESENT ILLNESS: This is a 22 y.o. male here for follow-up of pilonidal abscess status post I&D.  Doing well.  No complaints.  Packing still inside.  Not taking any antibiotics at present time.  Wound still draining.  HPI   Prior to Admission medications   Medication Sig Start Date End Date Taking? Authorizing Provider  fluticasone (FLONASE) 50 MCG/ACT nasal spray Place 2 sprays daily into both nostrils. 11/25/16  Yes Wallis Bamberg, PA-C  hydrOXYzine (VISTARIL) 50 MG capsule Take 1 capsule (50 mg total) by mouth at bedtime as needed. 10/05/15  Yes Wallis Bamberg, PA-C  ibuprofen (ADVIL,MOTRIN) 600 MG tablet Take 1 tablet (600 mg total) by mouth every 6 (six) hours as needed. 04/06/17  Yes Elpidio Anis, PA-C  rizatriptan (MAXALT) 10 MG tablet Take 1 tablet (10 mg total) by mouth as needed for migraine. May repeat once in 2 hours if needed 12/31/16  Yes Anson Fret, MD  cetirizine (ZYRTEC) 10 MG tablet Take 1 tablet (10 mg total) daily by mouth. Patient not taking: Reported on 04/07/2017 11/25/16   Wallis Bamberg, PA-C  HYDROcodone-acetaminophen [facility]) 5-325 MG tablet Take 1 tablet by mouth every 6 (six) hours as needed for moderate pain. Patient not taking: Reported on 04/07/2017 04/06/17   Elpidio Anis, PA-C  methylPREDNISolone (MEDROL DOSEPAK) 4 MG TBPK tablet follow package directions Patient not taking: Reported on 01/19/2017 12/31/16   Anson Fret, MD  nortriptyline (PAMELOR) 25 MG capsule Take 1 capsule (25 mg total) by mouth at bedtime. Patient not taking: Reported on 04/07/2017 12/31/16   Anson Fret, MD  ondansetron (ZOFRAN) 4 MG tablet Take 1 tablet (4 mg total) by mouth every 8 (eight) hours as needed for nausea or vomiting. Patient not taking: Reported on 04/07/2017 12/31/16   Anson Fret, MD    Allergies  Allergen Reactions  . Peanut Oil Rash  .  Eggs Or Egg-Derived Products     Other reaction(s): Abdominal Pain  . Hydrocodone-Acetaminophen Rash    Rash on thigh and upper body with itching    Patient Active Problem List   Diagnosis Date Noted  . Influenza with respiratory manifestation 01/19/2017  . Cough 01/19/2017  . Generalized muscle ache 01/19/2017  . Chronic migraine without aura without status migrainosus, not intractable 12/31/2016  . Intractable chronic migraine without aura and without status migrainosus 12/09/2016  . Other headache syndrome 12/09/2016  . Pilonidal cyst with abscess 09/01/2015  . Rash and nonspecific skin eruption 05/25/2015  . Epididymitis 05/25/2015  . Anxiety 04/13/2015    Past Medical History:  Diagnosis Date  . Allergy   . Anxiety   . Asthma     Past Surgical History:  Procedure Laterality Date  . TONSILLECTOMY      Social History   Socioeconomic History  . Marital status: Single    Spouse name: Not on file  . Number of children: 0  . Years of education: Not on file  . Highest education level: Associate degree: academic program  Occupational History    Employer: [employer]  Social Needs  . Financial resource strain: Not on file  . Food insecurity:    Worry: Not on file    Inability: Not on file  . Transportation needs:    Medical: Not on file    Non-medical: Not on file  Tobacco Use  .  Smoking status: Never Smoker  . Smokeless tobacco: Never Used  Substance and Sexual Activity  . Alcohol use: No    Alcohol/week: 0.0 oz  . Drug use: No  . Sexual activity: Not on file  Lifestyle  . Physical activity:    Days per week: Not on file    Minutes per session: Not on file  . Stress: Not on file  Relationships  . Social connections:    Talks on phone: Not on file    Gets together: Not on file    Attends religious service: Not on file    Active member of club or organization: Not on file    Attends meetings of clubs or organizations: Not on file    Relationship  status: Not on file  . Intimate partner violence:    Fear of current or ex partner: Not on file    Emotionally abused: Not on file    Physically abused: Not on file    Forced sexual activity: Not on file  Other Topics Concern  . 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 Hyperlipidemia Father   . Hypertension Father   . Diabetes Father   . Diabetes Maternal Grandmother   . Migraines Maternal Grandmother   . Stroke Maternal Grandfather   . Hypertension Maternal Grandfather   . Stroke Paternal Grandfather      Review of Systems  Constitutional: Negative.  Negative for chills and fever.  Respiratory: Negative for cough and shortness of breath.   Cardiovascular: Negative for chest pain.  Gastrointestinal: Positive for nausea. Negative for vomiting.  Skin: Negative for rash.  Neurological: Positive for dizziness. Negative for headaches.   Vitals:   04/10/17 1455  BP: 100/67  Pulse: 86  Resp: 16  Temp: 98.4 F (36.9 C)  SpO2: 94%     Physical Exam  Constitutional: He is oriented to person, place, and time. He appears well-developed and well-nourished.  HENT:  Head: Normocephalic and atraumatic.  Eyes: Pupils are equal, round, and reactive to light. EOM are normal.  Neck: Normal range of motion.  Cardiovascular: Normal rate.  Pulmonary/Chest: Effort normal.  Musculoskeletal: Normal range of motion.  Neurological: He is alert and oriented to person, place, and time.  Skin:  Pilonidal area: Packing removed, area looks better, still draining.  No significant tenderness.  Vitals reviewed.    ASSESSMENT & PLAN: Jedaiah was seen today for wound care.  Diagnoses and all orders for this visit:  Pilonidal cyst with abscess -     sulfamethoxazole-trimethoprim (BACTRIM DS,SEPTRA DS) 800-160 MG  tablet; Take 1 tablet by mouth 2 (two) times daily for 5 days. -     cetirizine (ZYRTEC) 10 MG tablet; Take 1 tablet (10 mg total) by mouth daily.   Patient Instructions       IF you received an x-ray today, you will receive an invoice from [facility]. Please contact [facility] at [phone] with questions or concerns regarding your invoice.   IF you received labwork today, you will receive an invoice from [place]. Please contact [facility] at [phone] with questions or concerns regarding your invoice.   Our billing staff will not be able to assist you with questions regarding bills from these companies.  You will be contacted with the lab results as soon as they are available. The fastest way to get your results is to activate your My Chart account. Instructions are located on the last page of this paperwork. If you have not heard from us regarding the results in 2 weeks, please contact this office.    Pilonidal Cyst A pilonidal cyst is a fluid-filled sac. It forms beneath the skin near your tailbone, at the top of the crease of your buttocks. A pilonidal cyst that is not large or infected may not cause symptoms or problems. If the cyst becomes irritated or infected, it may fill with pus. This causes pain and swelling (pilonidal abscess). An infected cyst may need to be treated with medicine, drained, or removed. What are the causes? The cause of a pilonidal cyst is not known. One cause may be a hair that grows into your skin (ingrown hair). What increases the risk? Pilonidal cysts are more common in boys and men. Risk factors include:  Having lots of hair near the crease of the buttocks.  Being overweight.  Having a pilonidal dimple.  Wearing tight clothing.  Not bathing or showering frequently.  Sitting for long periods of time.  What are the signs or symptoms? Signs and symptoms of a pilonidal cyst may include:  Redness.  Pain and  tenderness.  Warmth.  Swelling.  Pus.  Fever.  How is this diagnosed? Your health care provider may diagnose a pilonidal cyst based on your symptoms and a physical exam. The health care provider may do a blood test to check for infection. If your cyst is draining pus, your health care provider may take a sample of the drainage to be tested at a laboratory. How is this treated? Surgery is the usual treatment for an infected pilonidal cyst. You may also have to take medicines before surgery. The type of surgery you have depends on the size and severity of the infected cyst. The different kinds of surgery include:  Incision and drainage. This is a procedure to open and drain the cyst.  Marsupialization. In this procedure, a large cyst or abscess may be opened and kept open by stitching the edges of the skin to the cyst walls.  Cyst removal. This procedure involves opening the skin and removing all or part of the cyst.  Follow these instructions at home:  Follow all of your surgeon's instructions carefully if you had surgery.  Take medicines only as directed by your health care provider.  If you were prescribed an antibiotic medicine, finish it all even if you start to feel better.  Keep the area around your pilonidal cyst clean and dry.  Clean the area as directed by your health care provider. Pat the area dry with a clean towel. Do not rub it as this may cause bleeding.  Remove hair from the area around the cyst as directed by your health care provider.  Do not wear tight clothing or sit in one place for long periods of time.  There are many different ways to close and cover an incision, including stitches, skin glue, and adhesive strips. Follow your health care provider's instructions on: ? Incision care. ? Bandage (dressing) changes and removal. ? Incision closure removal. Contact a health care provider if:  You have drainage, redness, swelling, or pain at the site of the  cyst.  You have a fever. This information is not intended to replace advice given to you by your health care provider.  Make sure you discuss any questions you have with your health care provider. Document Released: 01/04/2000 Document Revised: 06/14/2015 Document Reviewed: 05/26/2013 Elsevier Interactive Patient Education  2018 Elsevier Inc.      Edwina Barth, MD [facility]

## 2017-04-10 NOTE — Patient Instructions (Addendum)
   IF you received an x-ray today, you will receive an invoice from [facility]. Please contact [facility] at [phone] with questions or concerns regarding your invoice.   IF you received labwork today, you will receive an invoice from [facility]. Please contact [facility] at [phone] with questions or concerns regarding your invoice.   Our billing staff will not be able to assist you with questions regarding bills from these companies.  You will be contacted with the lab results as soon as they are available. The fastest way to get your results is to activate your My Chart account. Instructions are located on the last page of this paperwork. If you have not heard from us regarding the results in 2 weeks, please contact this office.    Pilonidal Cyst A pilonidal cyst is a fluid-filled sac. It forms beneath the skin near your tailbone, at the top of the crease of your buttocks. A pilonidal cyst that is not large or infected may not cause symptoms or problems. If the cyst becomes irritated or infected, it may fill with pus. This causes pain and swelling (pilonidal abscess). An infected cyst may need to be treated with medicine, drained, or removed. What are the causes? The cause of a pilonidal cyst is not known. One cause may be a hair that grows into your skin (ingrown hair). What increases the risk? Pilonidal cysts are more common in boys and men. Risk factors include:  Having lots of hair near the crease of the buttocks.  Being overweight.  Having a pilonidal dimple.  Wearing tight clothing.  Not bathing or showering frequently.  Sitting for long periods of time.  What are the signs or symptoms? Signs and symptoms of a pilonidal cyst may include:  Redness.  Pain and tenderness.  Warmth.  Swelling.  Pus.  Fever.  How is this diagnosed? Your health care provider may diagnose a pilonidal cyst based on your symptoms and a physical exam. The health  care provider may do a blood test to check for infection. If your cyst is draining pus, your health care provider may take a sample of the drainage to be tested at a laboratory. How is this treated? Surgery is the usual treatment for an infected pilonidal cyst. You may also have to take medicines before surgery. The type of surgery you have depends on the size and severity of the infected cyst. The different kinds of surgery include:  Incision and drainage. This is a procedure to open and drain the cyst.  Marsupialization. In this procedure, a large cyst or abscess may be opened and kept open by stitching the edges of the skin to the cyst walls.  Cyst removal. This procedure involves opening the skin and removing all or part of the cyst.  Follow these instructions at home:  Follow all of your surgeon's instructions carefully if you had surgery.  Take medicines only as directed by your health care provider.  If you were prescribed an antibiotic medicine, finish it all even if you start to feel better.  Keep the area around your pilonidal cyst clean and dry.  Clean the area as directed by your health care provider. Pat the area dry with a clean towel. Do not rub it as this may cause bleeding.  Remove hair from the area around the cyst as directed by your health care provider.  Do not wear tight clothing or sit in one place for long periods of time.  There are many different   ways to close and cover an incision, including stitches, skin glue, and adhesive strips. Follow your health care provider's instructions on: ? Incision care. ? Bandage (dressing) changes and removal. ? Incision closure removal. Contact a health care provider if:  You have drainage, redness, swelling, or pain at the site of the cyst.  You have a fever. This information is not intended to replace advice given to you by your health care provider. Make sure you discuss any questions you have with your health care  provider. Document Released: 01/04/2000 Document Revised: 06/14/2015 Document Reviewed: 05/26/2013 Elsevier Interactive Patient Education  2018 Elsevier Inc.  

## 2017-04-13 ENCOUNTER — Ambulatory Visit: Payer: Self-pay | Admitting: Adult Health

## 2017-04-13 ENCOUNTER — Telehealth: Payer: Self-pay | Admitting: *Deleted

## 2017-04-13 ENCOUNTER — Encounter: Payer: Self-pay | Admitting: Adult Health

## 2017-04-13 NOTE — Telephone Encounter (Signed)
Patient was no show for follow up with NP today.  

## 2017-04-22 ENCOUNTER — Ambulatory Visit: Payer: Self-pay | Admitting: *Deleted

## 2017-04-22 NOTE — Telephone Encounter (Signed)
Thanks

## 2017-04-22 NOTE — Telephone Encounter (Signed)
  Reason for Disposition . [1] Drinking very little AND [2] dehydration suspected (e.g., no urine > 12 hours, very dry mouth, very lightheaded)  Answer Assessment - Initial Assessment Questions 1. VOMITING SEVERITY: "How many times have you vomited in the past 24 hours?"     - MILD:  1 - 2 times/day    - MODERATE: 3 - 5 times/day, decreased oral intake without significant weight loss or symptoms of dehydration    - SEVERE: 6 or more times/day, vomits everything or nearly everything, with significant weight loss, symptoms of dehydration      Severe 2. ONSET: "When did the vomiting begin?"      Yesterday evening around 2230 3. FLUIDS: "What fluids or food have you vomited up today?" "Have you been able to keep any fluids down?"     "Very little water." Severe nausea 4. ABDOMINAL PAIN: "Are your having any abdominal pain?" If yes : "How bad is it and what does it feel like?" (e.g., crampy, dull, intermittent, constant)      Constant 6/10 at umbilicus, radiates to entire abdomen. Worse with movement. 5. DIARRHEA: "Is there any diarrhea?" If so, ask: "How many times today?"      1 episode last night 6. CONTACTS: "Is there anyone else in the family with the same symptoms?"      no 7. CAUSE: "What do you think is causing your vomiting?"     Food poisoning. 8. HYDRATION STATUS: "Any signs of dehydration?" (e.g., dry mouth [not only dry lips], too weak to stand) "When did you last urinate?"     No urine since last night, weak 9. OTHER SYMPTOMS: "Do you have any other symptoms?" (e.g., fever, headache, vertigo, vomiting blood or coffee grounds, recent head injury) Intermittent dizziness  Protocols used: [facility]

## 2017-04-22 NOTE — Telephone Encounter (Signed)
Pt reports ate at food truck last night at 2130. States after "a few hours" experienced stomach "burning and began vomiting."  States vomiting severe during night; last episode 4am.Denies blood, no coffee ground appearance. "Food, then watery." One episode 'loose' stool in evening, no further. Reports abdominal pain, center of abdomen at umbilicus, then radiates entire abdomen; rates at 6/10. States weak, intermittent dizziness, severe nausea this am. Has had only few sips water since last night. States has not urinated since then as well. Pt. reports his friends whom also ate at food truck are asymptomatic. During  call, pt groaning with abdominal pain, states "worse with movement." Pt directed to ED; states his friend will drive. Reason for Disposition . [1] Drinking very little AND [2] dehydration suspected (e.g., no urine > 12 hours, very dry mouth, very lightheaded)  Answer Assessment - Initial Assessment Questions 1. VOMITING SEVERITY: "How many times have you vomited in the past 24 hours?"     - MILD:  1 - 2 times/day    - MODERATE: 3 - 5 times/day, decreased oral intake without significant weight loss or symptoms of dehydration    - SEVERE: 6 or more times/day, vomits everything or nearly everything, with significant weight loss, symptoms of dehydration      Severe 2. ONSET: "When did the vomiting begin?"      Yesterday evening around 2230 3. FLUIDS: "What fluids or food have you vomited up today?" "Have you been able to keep any fluids down?"     "Very little water." Severe nausea 4. ABDOMINAL PAIN: "Are your having any abdominal pain?" If yes : "How bad is it and what does it feel like?" (e.g., crampy, dull, intermittent, constant)      Constant 6/10 at umbilicus, radiates to entire abdomen. Worse with movement. 5. DIARRHEA: "Is there any diarrhea?" If so, ask: "How many times today?"      1 episode last night 6. CONTACTS: "Is there anyone else in the family with the same symptoms?"   no 7. CAUSE: "What do you think is causing your vomiting?"     Food poisoning. 8. HYDRATION STATUS: "Any signs of dehydration?" (e.g., dry mouth [not only dry lips], too weak to stand) "When did you last urinate?"     No urine since last night, weak 9. OTHER SYMPTOMS: "Do you have any other symptoms?" (e.g., fever, headache, vertigo, vomiting blood or coffee grounds, recent head injury) Intermittent dizziness  Protocols used: [facility]

## 2017-05-19 ENCOUNTER — Other Ambulatory Visit: Payer: Self-pay

## 2017-05-19 ENCOUNTER — Encounter: Payer: Self-pay | Admitting: Emergency Medicine

## 2017-05-19 ENCOUNTER — Ambulatory Visit (INDEPENDENT_AMBULATORY_CARE_PROVIDER_SITE_OTHER): Payer: 59

## 2017-05-19 ENCOUNTER — Ambulatory Visit (INDEPENDENT_AMBULATORY_CARE_PROVIDER_SITE_OTHER): Payer: 59 | Admitting: Emergency Medicine

## 2017-05-19 VITALS — BP 108/88 | HR 82 | Temp 98.5°F | Resp 16 | Ht 71.0 in | Wt 230.2 lb

## 2017-05-19 DIAGNOSIS — J22 Unspecified acute lower respiratory infection: Secondary | ICD-10-CM

## 2017-05-19 DIAGNOSIS — Z8709 Personal history of other diseases of the respiratory system: Secondary | ICD-10-CM

## 2017-05-19 DIAGNOSIS — R05 Cough: Secondary | ICD-10-CM | POA: Diagnosis not present

## 2017-05-19 DIAGNOSIS — R053 Chronic cough: Secondary | ICD-10-CM

## 2017-05-19 LAB — CBC WITH DIFFERENTIAL/PLATELET
Basophils Absolute: 0 10*3/uL (ref 0.0–0.2)
Basos: 0 %
EOS (ABSOLUTE): 0.2 10*3/uL (ref 0.0–0.4)
Eos: 2 %
Hematocrit: 50.3 % (ref 37.5–51.0)
Hemoglobin: 16.5 g/dL (ref 13.0–17.7)
Immature Grans (Abs): 0 10*3/uL (ref 0.0–0.1)
Immature Granulocytes: 0 %
Lymphocytes Absolute: 2 10*3/uL (ref 0.7–3.1)
Lymphs: 28 %
MCH: 27.7 pg (ref 26.6–33.0)
MCHC: 32.8 g/dL (ref 31.5–35.7)
MCV: 84 fL (ref 79–97)
Monocytes Absolute: 0.6 10*3/uL (ref 0.1–0.9)
Monocytes: 9 %
Neutrophils Absolute: 4.3 10*3/uL (ref 1.4–7.0)
Neutrophils: 61 %
Platelets: 188 10*3/uL (ref 150–379)
RBC: 5.96 x10E6/uL — ABNORMAL HIGH (ref 4.14–5.80)
RDW: 13.3 % (ref 12.3–15.4)
WBC: 7.1 10*3/uL (ref 3.4–10.8)

## 2017-05-19 LAB — COMPREHENSIVE METABOLIC PANEL
ALT: 33 IU/L (ref 0–44)
AST: 18 IU/L (ref 0–40)
Albumin/Globulin Ratio: 1.8 (ref 1.2–2.2)
Albumin: 4.6 g/dL (ref 3.5–5.5)
Alkaline Phosphatase: 80 IU/L (ref 39–117)
BUN/Creatinine Ratio: 10 (ref 9–20)
BUN: 10 mg/dL (ref 6–20)
Bilirubin Total: 0.3 mg/dL (ref 0.0–1.2)
CO2: 27 mmol/L (ref 20–29)
Calcium: 9.8 mg/dL (ref 8.7–10.2)
Chloride: 102 mmol/L (ref 96–106)
Creatinine, Ser: 0.98 mg/dL (ref 0.76–1.27)
GFR calc Af Amer: 126 mL/min/{1.73_m2} (ref >59)
GFR calc non Af Amer: 109 mL/min/{1.73_m2} (ref >59)
Globulin, Total: 2.6 g/dL (ref 1.5–4.5)
Glucose: 98 mg/dL (ref 65–99)
Potassium: 4.6 mmol/L (ref 3.5–5.2)
Sodium: 141 mmol/L (ref 134–144)
Total Protein: 7.2 g/dL (ref 6.0–8.5)

## 2017-05-19 MED ORDER — ALBUTEROL SULFATE HFA 108 (90 BASE) MCG/ACT IN AERS: 2.0000 | INHALATION_SPRAY | Freq: Four times a day (QID) | RESPIRATORY_TRACT | 0 refills | Status: DC | PRN

## 2017-05-19 MED ORDER — BECLOMETHASONE DIPROP HFA 40 MCG/ACT IN AERB: 1.0000 | INHALATION_SPRAY | Freq: Two times a day (BID) | RESPIRATORY_TRACT | 11 refills | Status: DC

## 2017-05-19 MED ORDER — AZITHROMYCIN 250 MG PO TABS: ORAL_TABLET | ORAL | 0 refills | Status: DC

## 2017-05-19 NOTE — Patient Instructions (Addendum)
     IF you received an x-ray today, you will receive an invoice from [facility]. Please contact [facility] at [phone] with questions or concerns regarding your invoice.   IF you received labwork today, you will receive an invoice from [facility]. Please contact [facility] at [phone] with questions or concerns regarding your invoice.   Our billing staff will not be able to assist you with questions regarding bills from these companies.  You will be contacted with the lab results as soon as they are available. The fastest way to get your results is to activate your My Chart account. Instructions are located on the last page of this paperwork. If you have not heard from us regarding the results in 2 weeks, please contact this office.     Cough, Adult A cough helps to clear your throat and lungs. A cough may last only 2-3 weeks (acute), or it may last longer than 8 weeks (chronic). Many different things can cause a cough. A cough may be a sign of an illness or another medical condition. Follow these instructions at home:  Pay attention to any changes in your cough.  Take medicines only as told by your doctor. ? If you were prescribed an antibiotic medicine, take it as told by your doctor. Do not stop taking it even if you start to feel better. ? Talk with your doctor before you try using a cough medicine.  Drink enough fluid to keep your pee (urine) clear or pale yellow.  If the air is dry, use a cold steam vaporizer or humidifier in your home.  Stay away from things that make you cough at work or at home.  If your cough is worse at night, try using extra pillows to raise your head up higher while you sleep.  Do not smoke, and try not to be around smoke. If you need help quitting, ask your doctor.  Do not have caffeine.  Do not drink alcohol.  Rest as needed. Contact a doctor if:  You have new problems (symptoms).  You cough up yellow fluid  (pus).  Your cough does not get better after 2-3 weeks, or your cough gets worse.  Medicine does not help your cough and you are not sleeping well.  You have pain that gets worse or pain that is not helped with medicine.  You have a fever.  You are losing weight and you do not know why.  You have night sweats. Get help right away if:  You cough up blood.  You have trouble breathing.  Your heartbeat is very fast. This information is not intended to replace advice given to you by your health care provider. Make sure you discuss any questions you have with your health care provider. Document Released: 09/19/2010 Document Revised: 06/14/2015 Document Reviewed: 03/15/2014 Elsevier Interactive Patient Education  2018 Elsevier Inc.  

## 2017-05-19 NOTE — Progress Notes (Signed)
Michael Lam 21 y.o.   Chief Complaint  Patient presents with  . Cough    productive - greenish,yellowish and bloody mucus for months  . Sleep Apnea    with snoring for months    HISTORY OF PRESENT ILLNESS: This is a 22 y.o. male complaining of persistent cough for the past 2 months at times sputum tinged with blood.  Denies fever or chills.  Denies difficulty breathing.  Denies chest pain.  Eating and drinking well.  Denies nausea or vomiting.  No occupational exposure.  Has a history of asthma.  Also there is a concern for sleep apnea as witnesses report periods of loud snoring and trouble breathing during his sleep.  Has a general feeling of "not feeling well".  Works night shifts and sleeps during the day.  HPI   Prior to Admission medications   Medication Sig Start Date End Date Taking? Authorizing Provider  cetirizine (ZYRTEC) 10 MG tablet Take 1 tablet (10 mg total) by mouth daily. 04/10/17  Yes Velina Drollinger, Eilleen Kempf, MD  fluticasone [facility]) 50 MCG/ACT nasal spray Place 2 sprays daily into both nostrils. 11/25/16  Yes Wallis Bamberg, PA-C  hydrOXYzine (VISTARIL) 50 MG capsule Take 1 capsule (50 mg total) by mouth at bedtime as needed. 10/05/15  Yes Wallis Bamberg, PA-C  ibuprofen (ADVIL,MOTRIN) 600 MG tablet Take 1 tablet (600 mg total) by mouth every 6 (six) hours as needed. 04/06/17  Yes Upstill, Melvenia Beam, PA-C  nortriptyline (PAMELOR) 25 MG capsule Take 1 capsule (25 mg total) by mouth at bedtime. 12/31/16  Yes Anson Fret, MD  ondansetron (ZOFRAN) 4 MG tablet Take 1 tablet (4 mg total) by mouth every 8 (eight) hours as needed for nausea or vomiting. 12/31/16  Yes Anson Fret, MD  rizatriptan (MAXALT) 10 MG tablet Take 1 tablet (10 mg total) by mouth as needed for migraine. May repeat once in 2 hours if needed 12/31/16  Yes Anson Fret, MD  HYDROcodone-acetaminophen (NORCO) 5-325 MG tablet Take 1 tablet by mouth every 6 (six) hours as needed for moderate  pain. Patient not taking: Reported on 05/19/2017 04/06/17   Elpidio Anis, PA-C  methylPREDNISolone (MEDROL DOSEPAK) 4 MG TBPK tablet follow package directions Patient not taking: Reported on 01/19/2017 12/31/16   Anson Fret, MD    Allergies  Allergen Reactions  . Peanut Oil Rash  . Eggs Or Egg-Derived Products     Other reaction(s): Abdominal Pain  . Hydrocodone-Acetaminophen Rash    Rash on thigh and upper body with itching    Patient Active Problem List   Diagnosis Date Noted  . Influenza with respiratory manifestation 01/19/2017  . Cough 01/19/2017  . Generalized muscle ache 01/19/2017  . Chronic migraine without aura without status migrainosus, not intractable 12/31/2016  . Intractable chronic migraine without aura and without status migrainosus 12/09/2016  . Other headache syndrome 12/09/2016  . Pilonidal cyst with abscess 09/01/2015  . Rash and nonspecific skin eruption 05/25/2015  . Epididymitis 05/25/2015  . Anxiety 04/13/2015    Past Medical History:  Diagnosis Date  . Allergy   . Anxiety   . Asthma     Past Surgical History:  Procedure Laterality Date  . TONSILLECTOMY      Social History   Socioeconomic History  . Marital status: Single    Spouse name: Not on file  . Number of children: 0  . Years of education: Not on file  . Highest education level: Associate degree: academic program  Occupational History  Employer: [employer]  Social Needs  . Financial resource strain: Not on file  . Food insecurity:    Worry: Not on file    Inability: Not on file  . Transportation needs:    Medical: Not on file    Non-medical: Not on file  Tobacco Use  . Smoking status: Never Smoker  . Smokeless tobacco: Never Used  Substance and Sexual Activity  . Alcohol use: No    Alcohol/week: 0.0 oz  . Drug use: No  . Sexual activity: Not on file  Lifestyle  . Physical activity:    Days per week: Not on file    Minutes per session: Not on file  .  Stress: Not on file  Relationships  . Social connections:    Talks on phone: Not on file    Gets together: Not on file    Attends religious service: Not on file    Active member of club or organization: Not on file    Attends meetings of clubs or organizations: Not on file    Relationship status: Not on file  . Intimate partner violence:    Fear of current or ex partner: Not on file    Emotionally abused: Not on file    Physically abused: Not on file    Forced sexual activity: Not on file  Other Topics Concern  . 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 Hyperlipidemia Father   . Hypertension Father   . Diabetes Father   . Diabetes Maternal Grandmother   . Migraines Maternal Grandmother   . Stroke Maternal Grandfather   . Hypertension Maternal Grandfather   . Stroke Paternal Grandfather      Review of Systems  Constitutional: Negative.  Negative for chills and fever.  HENT: Negative for congestion, nosebleeds, sinus pain and sore throat.   Eyes: Negative.  Negative for discharge and redness.  Respiratory: Positive for cough, sputum production, shortness of breath and wheezing. Negative for hemoptysis.   Cardiovascular: Negative.  Negative for chest pain and palpitations.  Gastrointestinal: Negative.  Negative for abdominal pain, diarrhea, nausea and vomiting.  Genitourinary: Negative for dysuria and hematuria.  Musculoskeletal: Negative for back pain, myalgias and neck pain.  Skin: Negative.  Negative for rash.  Neurological: Negative.  Negative for dizziness, sensory change, focal weakness and headaches.  Endo/Heme/Allergies: Negative.   All other systems reviewed and are negative.   Vitals:   05/19/17 0934  BP: 108/88  Pulse: 82  Resp: 16  Temp: 98.5 F (36.9 C)  SpO2: 96%    Physical  Exam  Constitutional: He is oriented to person, place, and time. He appears well-nourished.  HENT:  Head: Normocephalic and atraumatic.  Right Ear: External ear normal.  Left Ear: External ear normal.  Nose: Nose normal.  Mouth/Throat: Oropharynx is clear and moist.  Eyes: Pupils are equal, round, and reactive to light. Conjunctivae and EOM are normal.  Neck: Normal range of motion. Neck supple. No JVD present.  Cardiovascular: Normal rate, regular rhythm and normal heart sounds.  Pulmonary/Chest: Effort normal and breath sounds normal.  Abdominal: Soft. Bowel sounds are normal. He exhibits no distension. There is no tenderness.  Musculoskeletal: Normal range of motion. He exhibits no edema.  Lymphadenopathy:    He has no cervical adenopathy.  Neurological: He is alert and oriented to person, place, and time. No sensory deficit. He exhibits normal muscle tone.  Skin: Skin is warm and dry. Capillary refill takes less than 2 seconds. No rash noted.  Psychiatric: He has a normal mood and affect. His behavior is normal.  Vitals reviewed.  Dg Chest 2 View  Result Date: 05/19/2017 CLINICAL DATA:  22 year old with chronic cough. EXAM: CHEST - 2 VIEW COMPARISON:  02/24/2017 FINDINGS: The heart size and mediastinal contours are within normal limits. Both lungs are clear. The visualized skeletal structures are unremarkable. IMPRESSION: Normal chest examination. Electronically Signed   By: Richarda Overlie M.D.   On: 05/19/2017 10:14   Chronic cough Multifactorial.  He has a history of asthma and may also have a lower respiratory infection.  Will treat with azithromycin and bronchodilator inhalers.  I suspect chronic hyperreactive airway is a main player with his condition. There is also a question of sleep apnea that needs evaluation.  Will refer to pulmonary.  A total of 25 minutes was spent in the room with the patient, greater than 50% of which was in counseling/coordination of care regarding DDx,  management, medications, and need for pulmonary evaluation.  ASSESSMENT & PLAN: Franko was seen today for cough and sleep apnea.  Diagnoses and all orders for this visit:  Chronic cough -     Ambulatory referral to Pulmonology -     CBC with Differential/Platelet -     Comprehensive metabolic panel -     DG Chest 2 View; Future -     beclomethasone (QVAR REDIHALER) 40 MCG/ACT inhaler; Inhale 1 puff into the lungs 2 (two) times daily.  History of asthma -     Ambulatory referral to Pulmonology -     beclomethasone (QVAR REDIHALER) 40 MCG/ACT inhaler; Inhale 1 puff into the lungs 2 (two) times daily. -     albuterol (PROVENTIL HFA;VENTOLIN HFA) 108 (90 Base) MCG/ACT inhaler; Inhale 2 puffs into the lungs every 6 (six) hours as needed for wheezing or shortness of breath.  Lower resp. tract infection -     azithromycin (ZITHROMAX) 250 MG tablet; Sig as indicated    Patient Instructions       IF you received an x-ray today, you will receive an invoice from [facility]. Please contact [facility] at [phone] with questions or concerns regarding your invoice.   IF you received labwork today, you will receive an invoice from [facility]. Please contact [facility] at [phone] with questions or concerns regarding your invoice.   Our billing staff will not be able to assist you with questions regarding bills from these companies.  You will be contacted with the lab results as soon as they are available. The fastest way to get your results is to activate your My Chart account. Instructions are located on the last page of this paperwork. If you have not heard from [place] regarding the results in 2 weeks, please contact this office.     Cough, Adult A cough helps to clear your throat and lungs. A cough may last only 2-3 weeks (acute), or it may last longer than 8 weeks (chronic). Many different things can cause a cough. A cough may be a sign of an illness or another  medical condition. Follow these instructions at home:  Pay attention to any changes in your cough.  Take medicines only as told by your doctor. ? If you were prescribed  an antibiotic medicine, take it as told by your doctor. Do not stop taking it even if you start to feel better. ? Talk with your doctor before you try using a cough medicine.  Drink enough fluid to keep your pee (urine) clear or pale yellow.  If the air is dry, use a cold steam vaporizer or humidifier in your home.  Stay away from things that make you cough at work or at home.  If your cough is worse at night, try using extra pillows to raise your head up higher while you sleep.  Do not smoke, and try not to be around smoke. If you need help quitting, ask your doctor.  Do not have caffeine.  Do not drink alcohol.  Rest as needed. Contact a doctor if:  You have new problems (symptoms).  You cough up yellow fluid (pus).  Your cough does not get better after 2-3 weeks, or your cough gets worse.  Medicine does not help your cough and you are not sleeping well.  You have pain that gets worse or pain that is not helped with medicine.  You have a fever.  You are losing weight and you do not know why.  You have night sweats. Get help right away if:  You cough up blood.  You have trouble breathing.  Your heartbeat is very fast. This information is not intended to replace advice given to you by your health care provider. Make sure you discuss any questions you have with your health care provider. Document Released: 09/19/2010 Document Revised: 06/14/2015 Document Reviewed: 03/15/2014 Elsevier Interactive Patient Education  2018 Elsevier Inc.      Edwina Barth, MD [facility]

## 2017-05-19 NOTE — Assessment & Plan Note (Signed)
Multifactorial.  He has a history of asthma and may also have a lower respiratory infection.  Will treat with azithromycin and bronchodilator inhalers.  I suspect chronic hyperreactive airway is a main player with his condition. There is also a question of sleep apnea that needs evaluation.  Will refer to pulmonary.

## 2017-05-20 ENCOUNTER — Encounter: Payer: Self-pay | Admitting: *Deleted

## 2017-05-20 ENCOUNTER — Encounter: Payer: Self-pay | Admitting: Radiology

## 2017-06-17 ENCOUNTER — Ambulatory Visit (INDEPENDENT_AMBULATORY_CARE_PROVIDER_SITE_OTHER): Payer: 59

## 2017-06-17 ENCOUNTER — Ambulatory Visit (INDEPENDENT_AMBULATORY_CARE_PROVIDER_SITE_OTHER): Payer: 59 | Admitting: Emergency Medicine

## 2017-06-17 ENCOUNTER — Encounter: Payer: Self-pay | Admitting: Emergency Medicine

## 2017-06-17 ENCOUNTER — Other Ambulatory Visit: Payer: Self-pay

## 2017-06-17 VITALS — BP 82/58 | HR 87 | Temp 98.1°F | Resp 16 | Wt 236.0 lb

## 2017-06-17 DIAGNOSIS — M67479 Ganglion, unspecified ankle and foot: Secondary | ICD-10-CM

## 2017-06-17 DIAGNOSIS — R21 Rash and other nonspecific skin eruption: Secondary | ICD-10-CM

## 2017-06-17 DIAGNOSIS — M79671 Pain in right foot: Secondary | ICD-10-CM | POA: Diagnosis not present

## 2017-06-17 MED ORDER — TRIAMCINOLONE ACETONIDE 0.1 % EX CREA: 1.0000 "application " | TOPICAL_CREAM | Freq: Two times a day (BID) | CUTANEOUS | 0 refills | Status: DC

## 2017-06-17 NOTE — Patient Instructions (Addendum)
   IF you received an x-ray today, you will receive an invoice from [facility]. Please contact [facility] at [phone] with questions or concerns regarding your invoice.   IF you received labwork today, you will receive an invoice from [facility]. Please contact [facility] at [phone] with questions or concerns regarding your invoice.   Our billing staff will not be able to assist you with questions regarding bills from these companies.  You will be contacted with the lab results as soon as they are available. The fastest way to get your results is to activate your My Chart account. Instructions are located on the last page of this paperwork. If you have not heard from us regarding the results in 2 weeks, please contact this office.     Ganglion Cyst A ganglion cyst is a noncancerous, fluid-filled lump that occurs near joints or tendons. The ganglion cyst grows out of a joint or the lining of a tendon. It most often develops in the hand or wrist, but it can also develop in the shoulder, elbow, hip, knee, ankle, or foot. The round or oval ganglion cyst can be the size of a pea or larger than a grape. Increased activity may enlarge the size of the cyst because more fluid starts to build up. What are the causes? It is not known what causes a ganglion cyst to grow. However, it may be related to:  Inflammation or irritation around the joint.  An injury.  Repetitive movements or overuse.  Arthritis.  What increases the risk? Risk factors include:  Being a woman.  Being age 20-50.  What are the signs or symptoms? Symptoms may include:  A lump. This most often appears on the hand or wrist, but it can occur in other areas of the body.  Tingling.  Pain.  Numbness.  Muscle weakness.  Weak grip.  Less movement in a joint.  How is this diagnosed? Ganglion cysts are most often diagnosed based on a physical exam. Your health care provider will feel the  lump and may shine a light alongside it. If it is a ganglion cyst, a light often shines through it. Your health care provider may order an X-ray, ultrasound, or MRI to rule out other conditions. How is this treated? Ganglion cysts usually go away on their own without treatment. If pain or other symptoms are involved, treatment may be needed. Treatment is also needed if the ganglion cyst limits your movement or if it gets infected. Treatment may include:  Wearing a brace or splint on your wrist or finger.  Taking anti-inflammatory medicine.  Draining fluid from the lump with a needle (aspiration).  Injecting a steroid into the joint.  Surgery to remove the ganglion cyst.  Follow these instructions at home:  Do not press on the ganglion cyst, poke it with a needle, or hit it.  Take medicines only as directed by your health care provider.  Wear your brace or splint as directed by your health care provider.  Watch your ganglion cyst for any changes.  Keep all follow-up visits as directed by your health care provider. This is important. Contact a health care provider if:  Your ganglion cyst becomes larger or more painful.  You have increased redness, red streaks, or swelling.  You have pus coming from the lump.  You have weakness or numbness in the affected area.  You have a fever or chills. This information is not intended to replace advice given to you by your   health care provider. Make sure you discuss any questions you have with your health care provider. Document Released: 01/04/2000 Document Revised: 06/14/2015 Document Reviewed: 06/21/2013 Elsevier Interactive Patient Education  2018 Elsevier Inc.  

## 2017-06-17 NOTE — Progress Notes (Signed)
Michael Lam 22 y.o.   Chief Complaint  Patient presents with  . Foot Pain    RIGHT x  1 1/2 months with bump on the bottom    HISTORY OF PRESENT ILLNESS: This is a 22 y.o. male complaining of pain to the right foot started about a month ago.  Felt a lump on the foot.  Denies injury. Also has a rash on the right hand.  No other significant symptoms.  HPI   Prior to Admission medications   Medication Sig Start Date End Date Taking? Authorizing Provider  albuterol (PROVENTIL HFA;VENTOLIN HFA) 108 (90 Base) MCG/ACT inhaler Inhale 2 puffs into the lungs every 6 (six) hours as needed for wheezing or shortness of breath. 05/19/17  Yes Iyanna Drummer, Eilleen Kempf, MD  beclomethasone (QVAR REDIHALER) 40 MCG/ACT inhaler Inhale 1 puff into the lungs 2 (two) times daily. 05/19/17  Yes Loredana Medellin, Eilleen Kempf, MD  cetirizine (ZYRTEC) 10 MG tablet Take 1 tablet (10 mg total) by mouth daily. 04/10/17  Yes Cody Albus, Eilleen Kempf, MD  hydrOXYzine (VISTARIL) 50 MG capsule Take 1 capsule (50 mg total) by mouth at bedtime as needed. 10/05/15  Yes Wallis Bamberg, PA-C  ibuprofen (ADVIL,MOTRIN) 600 MG tablet Take 1 tablet (600 mg total) by mouth every 6 (six) hours as needed. 04/06/17  Yes Upstill, Melvenia Beam, PA-C  ondansetron (ZOFRAN) 4 MG tablet Take 1 tablet (4 mg total) by mouth every 8 (eight) hours as needed for nausea or vomiting. 12/31/16  Yes Anson Fret, MD  rizatriptan (MAXALT) 10 MG tablet Take 1 tablet (10 mg total) by mouth as needed for migraine. May repeat once in 2 hours if needed 12/31/16  Yes Anson Fret, MD  fluticasone [facility]) 50 MCG/ACT nasal spray Place 2 sprays daily into both nostrils. Patient not taking: Reported on 06/17/2017 11/25/16   Wallis Bamberg, PA-C  HYDROcodone-acetaminophen [facility]) 5-325 MG tablet Take 1 tablet by mouth every 6 (six) hours as needed for moderate pain. Patient not taking: Reported on 05/19/2017 04/06/17   Elpidio Anis, PA-C  methylPREDNISolone (MEDROL  DOSEPAK) 4 MG TBPK tablet follow package directions Patient not taking: Reported on 01/19/2017 12/31/16   Anson Fret, MD  nortriptyline (PAMELOR) 25 MG capsule Take 1 capsule (25 mg total) by mouth at bedtime. Patient not taking: Reported on 06/17/2017 12/31/16   Anson Fret, MD    Allergies  Allergen Reactions  . Peanut Oil Rash  . Eggs Or Egg-Derived Products     Other reaction(s): Abdominal Pain  . Hydrocodone-Acetaminophen Rash    Rash on thigh and upper body with itching    Patient Active Problem List   Diagnosis Date Noted  . History of asthma 05/19/2017  . Lower resp. tract infection 05/19/2017  . Influenza with respiratory manifestation 01/19/2017  . Chronic cough 01/19/2017  . Generalized muscle ache 01/19/2017  . Chronic migraine without aura without status migrainosus, not intractable 12/31/2016  . Intractable chronic migraine without aura and without status migrainosus 12/09/2016  . Other headache syndrome 12/09/2016  . Pilonidal cyst with abscess 09/01/2015  . Rash and nonspecific skin eruption 05/25/2015  . Epididymitis 05/25/2015  . Anxiety 04/13/2015    Past Medical History:  Diagnosis Date  . Allergy   . Anxiety   . Asthma     Past Surgical History:  Procedure Laterality Date  . TONSILLECTOMY      Social History   Socioeconomic History  . Marital status: Single    Spouse name: Not on file  .  Number of children: 0  . Years of education: Not on file  . Highest education level: Associate degree: academic program  Occupational History    Employer: [employer]  Social Needs  . Financial resource strain: Not on file  . Food insecurity:    Worry: Not on file    Inability: Not on file  . Transportation needs:    Medical: Not on file    Non-medical: Not on file  Tobacco Use  . Smoking status: Never Smoker  . Smokeless tobacco: Never Used  Substance and Sexual Activity  . Alcohol use: No    Alcohol/week: 0.0 oz  . Drug use: No  .  Sexual activity: Not on file  Lifestyle  . Physical activity:    Days per week: Not on file    Minutes per session: Not on file  . Stress: Not on file  Relationships  . Social connections:    Talks on phone: Not on file    Gets together: Not on file    Attends religious service: Not on file    Active member of club or organization: Not on file    Attends meetings of clubs or organizations: Not on file    Relationship status: Not on file  . Intimate partner violence:    Fear of current or ex partner: Not on file    Emotionally abused: Not on file    Physically abused: Not on file    Forced sexual activity: Not on file  Other Topics Concern  . 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 Hyperlipidemia Father   . Hypertension Father   . Diabetes Father   . Diabetes Maternal Grandmother   . Migraines Maternal Grandmother   . Stroke Maternal Grandfather   . Hypertension Maternal Grandfather   . Stroke Paternal Grandfather      Review of Systems  Constitutional: Negative.  Negative for chills and fever.  HENT: Negative.   Respiratory: Negative for cough and shortness of breath.   Cardiovascular: Negative for chest pain and palpitations.  Gastrointestinal: Negative for abdominal pain, nausea and vomiting.  Musculoskeletal:       Foot pain with lump.  Skin: Positive for rash.  Neurological: Negative.  Negative for dizziness and headaches.  Endo/Heme/Allergies: Negative.   All other systems reviewed and are negative.  Vitals:   06/17/17 1725  BP: (!) 82/58  Pulse: 87  Resp: 16  Temp: 98.1 F (36.7 C)  SpO2: 98%     Physical Exam  Constitutional: He is oriented to person, place, and time. He appears well-developed and well-nourished.  HENT:  Head: Normocephalic and atraumatic.  Eyes: Pupils  are equal, round, and reactive to light.  Neck: Normal range of motion.  Cardiovascular: Normal rate.  Pulmonary/Chest: Effort normal.  Musculoskeletal:  Right foot: Positive palpable ganglionic cyst to mid plantar surface.  Tender to deep palpation.  Neurological: He is oriented to person, place, and time.  Skin: Skin is warm and dry. Capillary refill takes less than 2 seconds.  Excoriated erythematous rash to right fifth MCP area.  Dry and nontender.  Psychiatric: He has a normal mood and affect. His behavior is normal.  Vitals reviewed.   Dg Foot Complete Right  Result Date: 06/17/2017 CLINICAL DATA:  Pain EXAM: RIGHT FOOT COMPLETE - 3+ VIEW COMPARISON:  None. FINDINGS: There is no evidence of fracture or dislocation. There is no evidence of arthropathy or other focal bone abnormality. Soft tissues are unremarkable. IMPRESSION: Negative. Electronically Signed   By: Corlis Leak M.D.   On: 06/17/2017 18:06    ASSESSMENT & PLAN: Zaul was seen today for foot pain.  Diagnoses and all orders for this visit:  Right foot pain -     DG Foot Complete Right; Future -     Ambulatory referral to Podiatry  Ganglion cyst of foot -     Ambulatory referral to Podiatry  Rash and nonspecific skin eruption -     triamcinolone cream (KENALOG) 0.1 %; Apply 1 application topically 2 (two) times daily.    Patient Instructions       IF you received an x-ray today, you will receive an invoice from [facility]. Please contact [facility] at [phone] with questions or concerns regarding your invoice.   IF you received labwork today, you will receive an invoice from [place]. Please contact [facility] at [phone] with questions or concerns regarding your invoice.   Our billing staff will not be able to assist you with questions regarding bills from these companies.  You will be contacted with the lab results as soon as they are available. The fastest way to get your  results is to activate your My Chart account. Instructions are located on the last page of this paperwork. If you have not heard from [place] regarding the results in 2 weeks, please contact this office.     Ganglion Cyst A ganglion cyst is a noncancerous, fluid-filled lump that occurs near joints or tendons. The ganglion cyst grows out of a joint or the lining of a tendon. It most often develops in the hand or wrist, but it can also develop in the shoulder, elbow, hip, knee, ankle, or foot. The round or oval ganglion cyst can be the size of a pea or larger than a grape. Increased activity may enlarge the size of the cyst because more fluid starts to build up. What are the causes? It is not known what causes a ganglion cyst to grow. However, it may be related to:  Inflammation or irritation around the joint.  An injury.  Repetitive movements or overuse.  Arthritis.  What increases the risk? Risk factors include:  Being a woman.  Being age 75-50.  What are the signs or symptoms? Symptoms may include:  A lump. This most often appears on the hand or wrist, but it can occur in other areas of the body.  Tingling.  Pain.  Numbness.  Muscle weakness.  Weak grip.  Less movement in a joint.  How is this diagnosed? Ganglion cysts are most often diagnosed based on a physical exam. Your health care provider will feel the lump and may shine a light alongside it. If it is a ganglion cyst, a light often shines through it. Your health care provider may order an X-ray, ultrasound, or MRI to rule out other conditions. How is this treated? Ganglion cysts usually go away on their own without treatment. If pain or other symptoms are involved, treatment may be needed. Treatment is also needed if the ganglion cyst limits your movement or if it gets infected. Treatment may include:  Wearing a brace or splint on your wrist or finger.  Taking anti-inflammatory medicine.  Draining fluid from the  lump with  a needle (aspiration).  Injecting a steroid into the joint.  Surgery to remove the ganglion cyst.  Follow these instructions at home:  Do not press on the ganglion cyst, poke it with a needle, or hit it.  Take medicines only as directed by your health care provider.  Wear your brace or splint as directed by your health care provider.  Watch your ganglion cyst for any changes.  Keep all follow-up visits as directed by your health care provider. This is important. Contact a health care provider if:  Your ganglion cyst becomes larger or more painful.  You have increased redness, red streaks, or swelling.  You have pus coming from the lump.  You have weakness or numbness in the affected area.  You have a fever or chills. This information is not intended to replace advice given to you by your health care provider. Make sure you discuss any questions you have with your health care provider. Document Released: 01/04/2000 Document Revised: 06/14/2015 Document Reviewed: 06/21/2013 Elsevier Interactive Patient Education  2018 Elsevier Inc.      Edwina Barth, MD [facility]

## 2017-06-22 ENCOUNTER — Telehealth: Payer: Self-pay | Admitting: Emergency Medicine

## 2017-06-22 NOTE — Telephone Encounter (Signed)
Received message from [facility] wanting to know which diagnosis they are to see pt for. They said sleep apnea eval was put in referral note, but diagnoses are for cough and hx of asthma. It looks like they contacted pt and went ahead and scheduled him for sleep consult first instead of cough. Is this okay?   Thanks!

## 2017-06-22 NOTE — Telephone Encounter (Signed)
Thank you :)

## 2017-06-22 NOTE — Telephone Encounter (Signed)
Thank you, Pattricia BossAnnie. The referral is mostly for sleep apnea so this is perfectly okay.  Thanks again.

## 2017-07-03 ENCOUNTER — Other Ambulatory Visit: Payer: Self-pay

## 2017-07-03 ENCOUNTER — Encounter: Payer: Self-pay | Admitting: Emergency Medicine

## 2017-07-03 ENCOUNTER — Ambulatory Visit (INDEPENDENT_AMBULATORY_CARE_PROVIDER_SITE_OTHER): Payer: 59 | Admitting: Emergency Medicine

## 2017-07-03 VITALS — BP 102/62 | HR 88 | Temp 98.0°F | Resp 16 | Ht 70.08 in | Wt 236.0 lb

## 2017-07-03 DIAGNOSIS — Z8639 Personal history of other endocrine, nutritional and metabolic disease: Secondary | ICD-10-CM | POA: Diagnosis not present

## 2017-07-03 DIAGNOSIS — R42 Dizziness and giddiness: Secondary | ICD-10-CM | POA: Diagnosis not present

## 2017-07-03 DIAGNOSIS — R5383 Other fatigue: Secondary | ICD-10-CM

## 2017-07-03 MED ORDER — BLOOD GLUCOSE MONITOR KIT: PACK | 0 refills | Status: DC

## 2017-07-03 NOTE — Patient Instructions (Addendum)
     IF you received an x-ray today, you will receive an invoice from [facility]. Please contact [facility] at [phone] with questions or concerns regarding your invoice.   IF you received labwork today, you will receive an invoice from [facility]. Please contact [facility] at [phone] with questions or concerns regarding your invoice.   Our billing staff will not be able to assist you with questions regarding bills from these companies.  You will be contacted with the lab results as soon as they are available. The fastest way to get your results is to activate your My Chart account. Instructions are located on the last page of this paperwork. If you have not heard from us regarding the results in 2 weeks, please contact this office.      Fatigue Fatigue is feeling tired all of the time, a lack of energy, or a lack of motivation. Occasional or mild fatigue is often a normal response to activity or life in general. However, long-lasting (chronic) or extreme fatigue may indicate an underlying medical condition. Follow these instructions at home: Watch your fatigue for any changes. The following actions may help to lessen any discomfort you are feeling:  Talk to your health care provider about how much sleep you need each night. Try to get the required amount every night.  Take medicines only as directed by your health care provider.  Eat a healthy and nutritious diet. Ask your health care provider if you need help changing your diet.  Drink enough fluid to keep your urine clear or pale yellow.  Practice ways of relaxing, such as yoga, meditation, massage therapy, or acupuncture.  Exercise regularly.  Change situations that cause you stress. Try to keep your work and personal routine reasonable.  Do not abuse illegal drugs.  Limit alcohol intake to no more than 1 drink per day for nonpregnant women and 2 drinks per day for men. One drink equals 12 ounces of  beer, 5 ounces of wine, or 1 ounces of hard liquor.  Take a multivitamin, if directed by your health care provider.  Contact a health care provider if:  Your fatigue does not get better.  You have a fever.  You have unintentional weight loss or gain.  You have headaches.  You have difficulty: ? Falling asleep. ? Sleeping throughout the night.  You feel angry, guilty, anxious, or sad.  You are unable to have a bowel movement (constipation).  You skin is dry.  Your legs or another part of your body is swollen. Get help right away if:  You feel confused.  Your vision is blurry.  You feel faint or pass out.  You have a severe headache.  You have severe abdominal, pelvic, or back pain.  You have chest pain, shortness of breath, or an irregular or fast heartbeat.  You are unable to urinate or you urinate less than normal.  You develop abnormal bleeding, such as bleeding from the rectum, vagina, nose, lungs, or nipples.  You vomit blood.  You have thoughts about harming yourself or committing suicide.  You are worried that you might harm someone else. This information is not intended to replace advice given to you by your health care provider. Make sure you discuss any questions you have with your health care provider. Document Released: 11/03/2006 Document Revised: 06/14/2015 Document Reviewed: 05/10/2013 Elsevier Interactive Patient Education  2018 Elsevier Inc.  

## 2017-07-03 NOTE — Progress Notes (Signed)
Michael Lam 21 y.o.   Chief Complaint  Patient presents with  . Fatigue    with some weakness, pt states he has a history of blood sugar problems   . Dizziness    x 2 months     HISTORY OF PRESENT ILLNESS: This is a 22 y.o. male complaining of frequent episodes of feeling shaky, weak, dizzy, and fatigued almost daily for the past month.  Mostly in the afternoons but also in the middle of the night.  Patient works 3 PM to 3 AM shifts.  Goes to sleep at around 5 in the morning and gets up around noon.  Positive family history of thalassemia.  Years ago was told he had hypoglycemic events.  No other significant symptoms.  Medication list reviewed and no identifiable problem/interaction.  No significant past medical history.  Eats well but is difficult to lose weight.  Feels very bloated after eating.  HPI   Prior to Admission medications   Medication Sig Start Date End Date Taking? Authorizing Provider  albuterol (PROVENTIL HFA;VENTOLIN HFA) 108 (90 Base) MCG/ACT inhaler Inhale 2 puffs into the lungs every 6 (six) hours as needed for wheezing or shortness of breath. 05/19/17  Yes Deira Shimer, Ines Bloomer, MD  beclomethasone (QVAR REDIHALER) 40 MCG/ACT inhaler Inhale 1 puff into the lungs 2 (two) times daily. 05/19/17  Yes Sayeed Weatherall, Ines Bloomer, MD  cetirizine (ZYRTEC) 10 MG tablet Take 1 tablet (10 mg total) by mouth daily. 04/10/17  Yes Emelee Rodocker, Ines Bloomer, MD  fluticasone [facility]) 50 MCG/ACT nasal spray Place 2 sprays daily into both nostrils. 11/25/16  Yes Jaynee Eagles, PA-C  HYDROcodone-acetaminophen (NORCO) 5-325 MG tablet Take 1 tablet by mouth every 6 (six) hours as needed for moderate pain. 04/06/17  Yes Upstill, Nehemiah Settle, PA-C  hydrOXYzine (VISTARIL) 50 MG capsule Take 1 capsule (50 mg total) by mouth at bedtime as needed. 10/05/15  Yes Jaynee Eagles, PA-C  ibuprofen (ADVIL,MOTRIN) 600 MG tablet Take 1 tablet (600 mg total) by mouth every 6 (six) hours as needed. 04/06/17  Yes  Charlann Lange, PA-C  methylPREDNISolone (MEDROL DOSEPAK) 4 MG TBPK tablet follow package directions 12/31/16  Yes Melvenia Beam, MD  nortriptyline (PAMELOR) 25 MG capsule Take 1 capsule (25 mg total) by mouth at bedtime. 12/31/16  Yes Melvenia Beam, MD  ondansetron (ZOFRAN) 4 MG tablet Take 1 tablet (4 mg total) by mouth every 8 (eight) hours as needed for nausea or vomiting. 12/31/16  Yes Melvenia Beam, MD  rizatriptan (MAXALT) 10 MG tablet Take 1 tablet (10 mg total) by mouth as needed for migraine. May repeat once in 2 hours if needed 12/31/16  Yes Melvenia Beam, MD  triamcinolone cream (KENALOG) 0.1 % Apply 1 application topically 2 (two) times daily. 06/17/17  Yes Horald Pollen, MD    Allergies  Allergen Reactions  . Peanut Oil Rash  . Eggs Or Egg-Derived Products     Other reaction(s): Abdominal Pain  . Hydrocodone-Acetaminophen Rash    Rash on thigh and upper body with itching    Patient Active Problem List   Diagnosis Date Noted  . Ganglion cyst of foot 06/17/2017  . Right foot pain 06/17/2017  . History of asthma 05/19/2017  . Lower resp. tract infection 05/19/2017  . Influenza with respiratory manifestation 01/19/2017  . Chronic cough 01/19/2017  . Generalized muscle ache 01/19/2017  . Chronic migraine without aura without status migrainosus, not intractable 12/31/2016  . Intractable chronic migraine without aura and without status  migrainosus 12/09/2016  . Other headache syndrome 12/09/2016  . Pilonidal cyst with abscess 09/01/2015  . Rash and nonspecific skin eruption 05/25/2015  . Epididymitis 05/25/2015  . Anxiety 04/13/2015    Past Medical History:  Diagnosis Date  . Allergy   . Anxiety   . Asthma     Past Surgical History:  Procedure Laterality Date  . TONSILLECTOMY      Social History   Socioeconomic History  . Marital status: Single    Spouse name: Not on file  . Number of children: 0  . Years of education: Not on file  .  Highest education level: Associate degree: academic program  Occupational History    Employer: [employer]  . Financial resource strain: Not on file  . Food insecurity:    Worry: Not on file    Inability: Not on file  . Transportation needs:    Medical: Not on file    Non-medical: Not on file  Tobacco Use  . Smoking status: Never Smoker  . Smokeless tobacco: Never Used  Substance and Sexual Activity  . Alcohol use: No    Alcohol/week: 0.0 oz  . Drug use: No  . Sexual activity: Not on file  Lifestyle  . Physical activity:    Days per week: Not on file    Minutes per session: Not on file  . Stress: Not on file  Relationships  . Social connections:    Talks on phone: Not on file    Gets together: Not on file    Attends religious service: Not on file    Active member of club or organization: Not on file    Attends meetings of clubs or organizations: Not on file    Relationship status: Not on file  . Intimate partner violence:    Fear of current or ex partner: Not on file    Emotionally abused: Not on file    Physically abused: Not on file    Forced sexual activity: Not on file  Other Topics Concern  . 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 Hyperlipidemia Father   . Hypertension Father   . Diabetes Father   . Diabetes Maternal Grandmother   . Migraines Maternal Grandmother   . Stroke Maternal Grandfather   . Hypertension Maternal Grandfather   . Stroke Paternal Grandfather      Review of Systems  Constitutional: Positive for malaise/fatigue. Negative for chills and fever.  HENT: Negative.  Negative for congestion, hearing loss, nosebleeds and sore throat.   Eyes: Negative.  Negative for discharge and redness.  Respiratory: Negative.  Negative for cough and shortness of  breath.   Cardiovascular: Negative.  Negative for chest pain and palpitations.  Gastrointestinal: Negative.  Negative for abdominal pain, diarrhea, nausea and vomiting.  Genitourinary: Negative.  Negative for dysuria and hematuria.  Musculoskeletal: Negative for joint pain, myalgias and neck pain.  Skin: Negative for rash.  Neurological: Positive for dizziness and weakness. Negative for headaches.  Endo/Heme/Allergies: Negative.   All other systems reviewed and are negative.   Vitals:   07/03/17 1413  BP: 102/62  Pulse: 88  Resp: 16  Temp: 98 F (36.7 C)  SpO2: 98%    Physical  Exam  Constitutional: He is oriented to person, place, and time. He appears well-developed and well-nourished.  HENT:  Head: Normocephalic and atraumatic.  Right Ear: External ear normal.  Left Ear: External ear normal.  Nose: Nose normal.  Mouth/Throat: Oropharynx is clear and moist.  Eyes: Pupils are equal, round, and reactive to light. Conjunctivae and EOM are normal.  Neck: Normal range of motion. Neck supple. No JVD present. No thyromegaly present.  Cardiovascular: Normal rate, regular rhythm, normal heart sounds and intact distal pulses.  Pulmonary/Chest: Effort normal and breath sounds normal.  Abdominal: Soft. Bowel sounds are normal. He exhibits no distension. There is no tenderness.  Musculoskeletal: Normal range of motion. He exhibits no edema or tenderness.  Lymphadenopathy:    He has no cervical adenopathy.  Neurological: He is alert and oriented to person, place, and time. No sensory deficit. He exhibits normal muscle tone.  Skin: Skin is warm and dry. Capillary refill takes less than 2 seconds. No rash noted.  Psychiatric: He has a normal mood and affect. His behavior is normal.  Vitals reviewed.    ASSESSMENT & PLAN: Roshun was seen today for fatigue and dizziness.  Diagnoses and all orders for this visit:  Fatigue, unspecified type -     blood glucose meter kit and supplies  KIT; Dispense based on patient and insurance preference. Use up to four times daily as directed. (FOR ICD-9 250.00, 250.01). -     Alpha-Thalassemia GenotypR -     CBC with Differential/Platelet -     Comprehensive metabolic panel -     Thyroid Panel With TSH -     Hemoglobin A1c  Dizziness -     blood glucose meter kit and supplies KIT; Dispense based on patient and insurance preference. Use up to four times daily as directed. (FOR ICD-9 250.00, 250.01). -     Alpha-Thalassemia GenotypR -     CBC with Differential/Platelet -     Comprehensive metabolic panel -     Thyroid Panel With TSH -     Hemoglobin A1c  History of hypoglycemia -     blood glucose meter kit and supplies KIT; Dispense based on patient and insurance preference. Use up to four times daily as directed. (FOR ICD-9 250.00, 250.01).    Patient Instructions       IF you received an x-ray today, you will receive an invoice from [facility]. Please contact [facility] at [phone] with questions or concerns regarding your invoice.   IF you received labwork today, you will receive an invoice from [facility]. Please contact [facility] at [phone] with questions or concerns regarding your invoice.   Our billing staff will not be able to assist you with questions regarding bills from these companies.  You will be contacted with the lab results as soon as they are available. The fastest way to get your results is to activate your My Chart account. Instructions are located on the last page of this paperwork. If you have not heard from [place] regarding the results in 2 weeks, please contact this office.      Fatigue Fatigue is feeling tired all of the time, a lack of energy, or a lack of motivation. Occasional or mild fatigue is often a normal response to activity or life in general. However, long-lasting (chronic) or extreme fatigue may indicate an underlying medical condition. Follow these instructions  at home: Watch your fatigue for any changes. The following actions may help to lessen any discomfort  you are feeling:  Talk to your health care provider about how much sleep you need each night. Try to get the required amount every night.  Take medicines only as directed by your health care provider.  Eat a healthy and nutritious diet. Ask your health care provider if you need help changing your diet.  Drink enough fluid to keep your urine clear or pale yellow.  Practice ways of relaxing, such as yoga, meditation, massage therapy, or acupuncture.  Exercise regularly.  Change situations that cause you stress. Try to keep your work and personal routine reasonable.  Do not abuse illegal drugs.  Limit alcohol intake to no more than 1 drink per day for nonpregnant women and 2 drinks per day for men. One drink equals 12 ounces of beer, 5 ounces of wine, or 1 ounces of hard liquor.  Take a multivitamin, if directed by your health care provider.  Contact a health care provider if:  Your fatigue does not get better.  You have a fever.  You have unintentional weight loss or gain.  You have headaches.  You have difficulty: ? Falling asleep. ? Sleeping throughout the night.  You feel angry, guilty, anxious, or sad.  You are unable to have a bowel movement (constipation).  You skin is dry.  Your legs or another part of your body is swollen. Get help right away if:  You feel confused.  Your vision is blurry.  You feel faint or pass out.  You have a severe headache.  You have severe abdominal, pelvic, or back pain.  You have chest pain, shortness of breath, or an irregular or fast heartbeat.  You are unable to urinate or you urinate less than normal.  You develop abnormal bleeding, such as bleeding from the rectum, vagina, nose, lungs, or nipples.  You vomit blood.  You have thoughts about harming yourself or committing suicide.  You are worried that you might harm  someone else. This information is not intended to replace advice given to you by your health care provider. Make sure you discuss any questions you have with your health care provider. Document Released: 11/03/2006 Document Revised: 06/14/2015 Document Reviewed: 05/10/2013 Elsevier Interactive Patient Education  2018 Elsevier Inc.      Agustina Caroli, MD [facility]

## 2017-07-06 ENCOUNTER — Ambulatory Visit (INDEPENDENT_AMBULATORY_CARE_PROVIDER_SITE_OTHER): Payer: 59 | Admitting: Internal Medicine

## 2017-07-06 ENCOUNTER — Encounter: Payer: Self-pay | Admitting: Internal Medicine

## 2017-07-06 ENCOUNTER — Encounter: Payer: Self-pay | Admitting: Emergency Medicine

## 2017-07-06 VITALS — BP 126/74 | HR 71 | Ht 72.0 in | Wt 239.2 lb

## 2017-07-06 DIAGNOSIS — G4733 Obstructive sleep apnea (adult) (pediatric): Secondary | ICD-10-CM

## 2017-07-06 DIAGNOSIS — G4726 Circadian rhythm sleep disorder, shift work type: Secondary | ICD-10-CM | POA: Diagnosis not present

## 2017-07-06 NOTE — Patient Instructions (Signed)
Order- please schedule unattended home sleep test      Dx OSA   Please call me for results and recommendations about 2 weeks after your sleep test. If appropriate, we may be able to start treatment before we see you next.

## 2017-07-06 NOTE — Assessment & Plan Note (Signed)
Probable diagnosis based on history, exam and family history. We discussed the basics of sleep hygiene, obstructive sleep apnea, importance of weight control, potential medical complications and outlined treatments. Plan-schedule sleep study.  Anticipate initial therapy with CPAP if appropriate.

## 2017-07-06 NOTE — Progress Notes (Signed)
07/06/2017-22 year old male never smoker for sleep evaluation. Sleep consult: Pt has never had sleep study. Pt notes heavy breathing and snores at night.  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  Lives with girlfriend.  Told of snoring and witnessed apneas.  Wakes himself gasping.  Has gained about 56 pounds in the last 2 years. ENT surgery-tonsils.  Treated for asthma.  Denies heart disease, seizures.  Prior to Admission medications   Medication Sig Start Date End Date Taking? Authorizing Provider  albuterol (PROVENTIL HFA;VENTOLIN HFA) 108 (90 Base) MCG/ACT inhaler Inhale 2 puffs into the lungs every 6 (six) hours as needed for wheezing or shortness of breath. 05/19/17  Yes Sagardia, Ines Bloomer, MD  beclomethasone (QVAR REDIHALER) 40 MCG/ACT inhaler Inhale 1 puff into the lungs 2 (two) times daily. 05/19/17  Yes Sagardia, Ines Bloomer, MD  blood glucose meter kit and supplies KIT Dispense based on patient and insurance preference. Use up to four times daily as directed. (FOR ICD-9 250.00, 250.01). 07/03/17  Yes Sagardia, Ines Bloomer, MD  cetirizine (ZYRTEC) 10 MG tablet Take 1 tablet (10 mg total) by mouth daily. 04/10/17  Yes Sagardia, Ines Bloomer, MD  fluticasone [facility]) 50 MCG/ACT nasal spray Place 2 sprays daily into both nostrils. 11/25/16  Yes Jaynee Eagles, PA-C  hydrOXYzine (VISTARIL) 50 MG capsule Take 1 capsule (50 mg total) by mouth at bedtime as needed. 10/05/15  Yes Jaynee Eagles, PA-C  triamcinolone cream (KENALOG) 0.1 % Apply 1 application topically 2 (two) times daily. 06/17/17  Yes Horald Pollen, MD   Past Medical History:  Diagnosis Date  . Allergy   . Anxiety   . Asthma    Past Surgical History:  Procedure Laterality Date  . TONSILLECTOMY     Family History  Problem Relation Age of Onset  . Hyperlipidemia Father   . Hypertension Father   . Diabetes Father   .  Diabetes Maternal Grandmother   . Migraines Maternal Grandmother   . Stroke Maternal Grandfather   . Hypertension Maternal Grandfather   . Stroke Paternal Grandfather    Social History   Socioeconomic History  . Marital status: Single    Spouse name: Not on file  . Number of children: 0  . Years of education: Not on file  . Highest education level: Associate degree: academic program  Occupational History    Employer: [employer]  . Financial resource strain: Not on file  . Food insecurity:    Worry: Not on file    Inability: Not on file  . Transportation needs:    Medical: Not on file    Non-medical: Not on file  Tobacco Use  . Smoking status: Never Smoker  . Smokeless tobacco: Never Used  Substance and Sexual Activity  . Alcohol use: No    Alcohol/week: 0.0 oz  . Drug use: No  . Sexual activity: Not on file  Lifestyle  . Physical activity:    Days per week: Not on file    Minutes per session: Not on file  . Stress: Not on file  Relationships  . Social connections:    Talks on phone: Not on file    Gets together: Not on file    Attends religious service: Not on file    Active member of club or organization: Not on file    Attends meetings of clubs or organizations: Not on  file    Relationship status: Not on file  . Intimate partner violence:    Fear of current or ex partner: Not on file    Emotionally abused: Not on file    Physically abused: Not on file    Forced sexual activity: Not on file  Other Topics Concern  . 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 HEENT:    +headaches, difficulty swallowing, tooth/dental problems, sore throat,       sneezing, itching, ear ache, nasal congestion, post nasal drip,  snoring CV:    chest pain, orthopnea, PND, swelling in lower extremities, anasarca,                                  dizziness, palpitations Resp:   shortness of breath with exertion or at rest.                productive cough,   non-productive cough, coughing up of blood.              change in color of mucus.  wheezing.   Skin:    rash or lesions. GI:  No-   heartburn, indigestion, abdominal pain, nausea, vomiting, diarrhea,                 change in bowel habits, loss of appetite GU: dysuria, change in color of urine, no urgency or frequency.   flank pain. MS:   joint pain, stiffness, decreased range of motion, back pain. Neuro-     nothing unusual Psych:  change in mood or affect.  depression or anxiety.   memory loss.  OBJ- Physical Exam General- Alert, Oriented, Affect-appropriate, Distress- none acute, + obese Skin- rash-none, lesions- none, excoriation- none Lymphadenopathy- none Head- atraumatic            Eyes- Gross vision intact, PERRLA, conjunctivae and secretions clear            Ears- Hearing, canals-normal            Nose- Clear, no-Septal dev, mucus, polyps, erosion, perforation             Throat- Mallampati II-III , mucosa clear , drainage- none, tonsils- atrophic Neck- flexible , trachea midline, no stridor , thyroid nl, carotid no bruit Chest - symmetrical excursion , unlabored           Heart/CV- RRR , no murmur , no gallop  , no rub, nl s1 s2                           - JVD- none , edema- none, stasis changes- none, varices- none           Lung- clear to P&A, wheeze- none, cough- none , dullness-none, rub- none           Chest wall-  Abd-  Br/ Gen/ Rectal- Not done, not indicated Extrem- cyanosis- none, clubbing, none, atrophy- none, strength- nl Neuro- grossly intact to observation

## 2017-07-06 NOTE — Assessment & Plan Note (Signed)
We discussed sleep environment protecting bedroom-dark, quiet without disturbance.

## 2017-07-07 ENCOUNTER — Ambulatory Visit (INDEPENDENT_AMBULATORY_CARE_PROVIDER_SITE_OTHER): Payer: 59 | Admitting: Podiatry

## 2017-07-07 ENCOUNTER — Encounter: Payer: Self-pay | Admitting: Podiatry

## 2017-07-07 VITALS — Ht 71.0 in | Wt 236.0 lb

## 2017-07-07 DIAGNOSIS — M216X2 Other acquired deformities of left foot: Secondary | ICD-10-CM

## 2017-07-07 DIAGNOSIS — M216X1 Other acquired deformities of right foot: Secondary | ICD-10-CM | POA: Diagnosis not present

## 2017-07-07 DIAGNOSIS — M21961 Unspecified acquired deformity of right lower leg: Secondary | ICD-10-CM

## 2017-07-07 DIAGNOSIS — M722 Plantar fascial fibromatosis: Secondary | ICD-10-CM | POA: Diagnosis not present

## 2017-07-07 DIAGNOSIS — M21969 Unspecified acquired deformity of unspecified lower leg: Secondary | ICD-10-CM

## 2017-07-07 NOTE — Patient Instructions (Signed)
Seen for painful knot right arch area. Noted of weak first metatarsal bone bilateral L>R, tight Achilles tendon right, which will cause excess compensatory pronation and excess tension on plantar fascial band. Diagnosis: injured plantar fascial band with scar formation. Reviewed stretch exercise. Equinus brace dispensed with instruction. Will call for Orthotic benefits. Return in one month for follow up on equinus brace.

## 2017-07-07 NOTE — Progress Notes (Signed)
SUBJECTIVE: 22 y.o. year old male presents complaining of pain in right foot with a palpable knot in arch area that was formed 2 weeks ago but the pain has been over a month. Patient denies any injury associated with painful foot.  Allergies: Asthma  Review of Systems  Constitutional: Negative.   HENT: Negative.   Eyes: Negative.   Respiratory: Negative.   Cardiovascular: Negative.   Gastrointestinal: Negative.   Genitourinary: Negative.   Musculoskeletal: Negative.   Skin: Negative.      OBJECTIVE: DERMATOLOGIC EXAMINATION: No abnormal skin lesions.  VASCULAR EXAMINATION OF LOWER LIMBS: All pedal pulses are palpable with normal pulsation.  Capillary Filling times within 3 seconds in all digits.  No edema or erythema noted. Temperature gradient from tibial crest to dorsum of foot is within normal bilateral.  NEUROLOGIC EXAMINATION OF THE LOWER LIMBS: All epicritic and tactile sensations grossly intact. Sharp and Dull discriminatory sensations at the plantar ball of hallux is intact bilateral.   MUSCULOSKELETAL EXAMINATION: Positive for palpable knot, 0.8cm in size attached to center strip of right plantar fascial band, painful with pressure. Hypermobile first ray L>R. Tight Achilles tendon right, normal on left.   ASSESSMENT: Plantar fasciitis right. Hyperpronation STJ bilateral. Ankle Equinus right.  PLAN: Reviewed findings and available treatment options. Instructed to do daily stretch exercise. Equinus brace dispensed with instruction. May benefit from [employer] orthotics. Return in one month for follow up on Equinus brace.

## 2017-07-10 ENCOUNTER — Telehealth: Payer: Self-pay | Admitting: *Deleted

## 2017-07-10 NOTE — Telephone Encounter (Signed)
For CPT code L3020 the plan covers 80% after 1400 deductible is met. Patient notified and he will call us back to let us know what he decides. I did let him know that we can do a payment plan

## 2017-07-14 LAB — COMPREHENSIVE METABOLIC PANEL
ALT: 57 IU/L — ABNORMAL HIGH (ref 0–44)
AST: 48 IU/L — ABNORMAL HIGH (ref 0–40)
Albumin/Globulin Ratio: 2 (ref 1.2–2.2)
Albumin: 4.5 g/dL (ref 3.5–5.5)
Alkaline Phosphatase: 64 IU/L (ref 39–117)
BUN/Creatinine Ratio: 20 (ref 9–20)
BUN: 17 mg/dL (ref 6–20)
Bilirubin Total: 0.3 mg/dL (ref 0.0–1.2)
CO2: 24 mmol/L (ref 20–29)
Calcium: 9.6 mg/dL (ref 8.7–10.2)
Chloride: 106 mmol/L (ref 96–106)
Creatinine, Ser: 0.86 mg/dL (ref 0.76–1.27)
GFR calc Af Amer: 142 mL/min/{1.73_m2} (ref >59)
GFR calc non Af Amer: 123 mL/min/{1.73_m2} (ref >59)
Globulin, Total: 2.3 g/dL (ref 1.5–4.5)
Glucose: 88 mg/dL (ref 65–99)
Potassium: 4.8 mmol/L (ref 3.5–5.2)
Sodium: 142 mmol/L (ref 134–144)
Total Protein: 6.8 g/dL (ref 6.0–8.5)

## 2017-07-14 LAB — CBC WITH DIFFERENTIAL/PLATELET
Basophils Absolute: 0 10*3/uL (ref 0.0–0.2)
Basos: 0 %
EOS (ABSOLUTE): 0.2 10*3/uL (ref 0.0–0.4)
Eos: 3 %
Hematocrit: 44.2 % (ref 37.5–51.0)
Hemoglobin: 14.5 g/dL (ref 13.0–17.7)
Immature Grans (Abs): 0 10*3/uL (ref 0.0–0.1)
Immature Granulocytes: 0 %
Lymphocytes Absolute: 2.1 10*3/uL (ref 0.7–3.1)
Lymphs: 30 %
MCH: 27.3 pg (ref 26.6–33.0)
MCHC: 32.8 g/dL (ref 31.5–35.7)
MCV: 83 fL (ref 79–97)
Monocytes Absolute: 0.7 10*3/uL (ref 0.1–0.9)
Monocytes: 10 %
Neutrophils Absolute: 4 10*3/uL (ref 1.4–7.0)
Neutrophils: 57 %
Platelets: 188 10*3/uL (ref 150–450)
RBC: 5.31 x10E6/uL (ref 4.14–5.80)
RDW: 13.8 % (ref 12.3–15.4)
WBC: 7 10*3/uL (ref 3.4–10.8)

## 2017-07-14 LAB — THYROID PANEL WITH TSH
Free Thyroxine Index: 1.8 (ref 1.2–4.9)
T3 Uptake Ratio: 30 % (ref 24–39)
T4, Total: 5.9 ug/dL (ref 4.5–12.0)
TSH: 3.16 u[IU]/mL (ref 0.450–4.500)

## 2017-07-14 LAB — HEMOGLOBIN A1C
Est. average glucose Bld gHb Est-mCnc: 108 mg/dL
Hgb A1c MFr Bld: 5.4 % (ref 4.8–5.6)

## 2017-07-14 LAB — ALPHA-THALASSEMIA GENOTYPR

## 2017-07-25 DIAGNOSIS — R0683 Snoring: Secondary | ICD-10-CM | POA: Diagnosis not present

## 2017-07-26 DIAGNOSIS — R0683 Snoring: Secondary | ICD-10-CM | POA: Diagnosis not present

## 2017-07-28 ENCOUNTER — Other Ambulatory Visit: Payer: Self-pay | Admitting: *Deleted

## 2017-07-28 DIAGNOSIS — G4733 Obstructive sleep apnea (adult) (pediatric): Secondary | ICD-10-CM

## 2017-08-06 ENCOUNTER — Ambulatory Visit: Payer: 59 | Admitting: Podiatry

## 2017-08-10 ENCOUNTER — Telehealth: Payer: Self-pay | Admitting: Internal Medicine

## 2017-08-10 NOTE — Telephone Encounter (Signed)
Called and spoke with Patient.  Patient was wanting to know his HST results.  HST results are not posted.  I explained to patient that someone will call with results as soon as they are made available. Patient stated understanding. Nothing further at this time.

## 2017-09-10 ENCOUNTER — Telehealth: Payer: Self-pay | Admitting: Internal Medicine

## 2017-09-10 NOTE — Telephone Encounter (Signed)
Called and spoke to pt. Pt is requesting HST results from 07/25/17. Dr. Maple HudsonYoung is out of the office until 09/25/17. Pt is requesting sleep study results prior to 09/25/17.  AO please advise if you would be willing to result study. Thanks

## 2017-09-16 NOTE — Telephone Encounter (Signed)
Dr. Olalere - please advise. Thanks. 

## 2017-09-16 NOTE — Telephone Encounter (Signed)
Attempted to contact pt. I did not receive an answer. There was no option for me to leave a message. Will try back.  

## 2017-09-16 NOTE — Telephone Encounter (Signed)
I did review the study  Negative study for sleep disordered breathing, no treatment needed  Follow-up in the office for symptom management at next scheduled visit

## 2017-09-17 NOTE — Telephone Encounter (Signed)
Attempted to contact pt. I did not receive an answer. There was no option for me to leave a message. Will try back.  

## 2017-09-18 ENCOUNTER — Ambulatory Visit (INDEPENDENT_AMBULATORY_CARE_PROVIDER_SITE_OTHER): Payer: 59 | Admitting: Emergency Medicine

## 2017-09-18 ENCOUNTER — Encounter: Payer: Self-pay | Admitting: Emergency Medicine

## 2017-09-18 ENCOUNTER — Other Ambulatory Visit: Payer: Self-pay

## 2017-09-18 VITALS — BP 127/76 | HR 67 | Temp 97.9°F | Resp 18 | Ht 71.0 in | Wt 242.2 lb

## 2017-09-18 DIAGNOSIS — R21 Rash and other nonspecific skin eruption: Secondary | ICD-10-CM

## 2017-09-18 DIAGNOSIS — R300 Dysuria: Secondary | ICD-10-CM

## 2017-09-18 DIAGNOSIS — R109 Unspecified abdominal pain: Secondary | ICD-10-CM

## 2017-09-18 LAB — POCT URINALYSIS DIP (MANUAL ENTRY)
Bilirubin, UA: NEGATIVE
Blood, UA: NEGATIVE
Glucose, UA: NEGATIVE mg/dL
Ketones, POC UA: NEGATIVE mg/dL
Leukocytes, UA: NEGATIVE
Nitrite, UA: NEGATIVE
Protein Ur, POC: NEGATIVE mg/dL
Spec Grav, UA: 1.025 (ref 1.010–1.025)
Urobilinogen, UA: 0.2 E.U./dL
pH, UA: 5.5 (ref 5.0–8.0)

## 2017-09-18 NOTE — Progress Notes (Signed)
Michael Lam 22 y.o.   Chief Complaint  Patient presents with  . Flank Pain    right lower flank pain; x1 week; denies any N/V  . Dysuria    x1 week; pain hurts trying to push urine out; states his urine is dark    HISTORY OF PRESENT ILLNESS: This is a 22 y.o. male complaining of pain to right flank which started  4 days ago.  Also noted some pain with urination along with dark in color.  Denies fever or chills.  Denies nausea or vomiting.  Denies abdominal pain.  Not sexually active.  Denies any other significant symptoms. HPI   Prior to Admission medications   Medication Sig Start Date End Date Taking? Authorizing Provider  albuterol (PROVENTIL HFA;VENTOLIN HFA) 108 (90 Base) MCG/ACT inhaler Inhale 2 puffs into the lungs every 6 (six) hours as needed for wheezing or shortness of breath. 05/19/17  Yes Carnita Golob, Ines Bloomer, MD  beclomethasone (QVAR REDIHALER) 40 MCG/ACT inhaler Inhale 1 puff into the lungs 2 (two) times daily. 05/19/17  Yes Deforest Maiden, Ines Bloomer, MD  blood glucose meter kit and supplies KIT Dispense based on patient and insurance preference. Use up to four times daily as directed. (FOR ICD-9 250.00, 250.01). 07/03/17  Yes Ruberta Holck, Ines Bloomer, MD  cetirizine (ZYRTEC) 10 MG tablet Take 1 tablet (10 mg total) by mouth daily. 04/10/17  Yes Alvita Fana, Ines Bloomer, MD  fluticasone [facility]) 50 MCG/ACT nasal spray Place 2 sprays daily into both nostrils. 11/25/16  Yes Jaynee Eagles, PA-C  hydrOXYzine (VISTARIL) 50 MG capsule Take 1 capsule (50 mg total) by mouth at bedtime as needed. 10/05/15  Yes Jaynee Eagles, PA-C  triamcinolone cream (KENALOG) 0.1 % Apply 1 application topically 2 (two) times daily. 06/17/17  Yes Horald Pollen, MD    Allergies  Allergen Reactions  . Peanut Oil Rash  . Eggs Or Egg-Derived Products     Other reaction(s): Abdominal Pain  . Hydrocodone-Acetaminophen Rash    Rash on thigh and upper body with itching    Patient Active  Problem List   Diagnosis Date Noted  . Shift work sleep disorder 07/06/2017  . Obstructive sleep apnea 07/06/2017  . Fatigue 07/03/2017  . Dizziness 07/03/2017  . History of hypoglycemia 07/03/2017  . Ganglion cyst of foot 06/17/2017  . History of asthma 05/19/2017  . Chronic migraine without aura without status migrainosus, not intractable 12/31/2016  . Other headache syndrome 12/09/2016  . Anxiety 04/13/2015    Past Medical History:  Diagnosis Date  . Allergy   . Anxiety   . Asthma     Past Surgical History:  Procedure Laterality Date  . TONSILLECTOMY      Social History   Socioeconomic History  . Marital status: Single    Spouse name: Not on file  . Number of children: 0  . Years of education: Not on file  . Highest education level: Associate degree: academic program  Occupational History    Employer: [employer]  . Financial resource strain: Not on file  . Food insecurity:    Worry: Not on file    Inability: Not on file  . Transportation needs:    Medical: Not on file    Non-medical: Not on file  Tobacco Use  . Smoking status: Never Smoker  . Smokeless tobacco: Never Used  Substance and Sexual Activity  . Alcohol use: No    Alcohol/week: 0.0 standard drinks  . Drug use: No  . Sexual  activity: Not on file  Lifestyle  . Physical activity:    Days per week: Not on file    Minutes per session: Not on file  . Stress: Not on file  Relationships  . Social connections:    Talks on phone: Not on file    Gets together: Not on file    Attends religious service: Not on file    Active member of club or organization: Not on file    Attends meetings of clubs or organizations: Not on file    Relationship status: Not on file  . Intimate partner violence:    Fear of current or ex partner: Not on file    Emotionally abused: Not on file    Physically abused: Not on file    Forced sexual activity: Not on file  Other Topics Concern  . 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 Hyperlipidemia Father   . Hypertension Father   . Diabetes Father   . Diabetes Maternal Grandmother   . Migraines Maternal Grandmother   . Stroke Maternal Grandfather   . Hypertension Maternal Grandfather   . Stroke Paternal Grandfather      Review of Systems  Constitutional: Negative.  Negative for chills and fever.  HENT: Negative.  Negative for sore throat.   Respiratory: Negative.  Negative for cough and shortness of breath.   Cardiovascular: Negative.  Negative for chest pain and palpitations.  Gastrointestinal: Negative.  Negative for abdominal pain, blood in stool, diarrhea, melena, nausea and vomiting.  Genitourinary: Positive for dysuria. Negative for flank pain, frequency and hematuria.  Musculoskeletal: Negative.   Skin: Negative.  Negative for rash.  Neurological: Negative.  Negative for dizziness and headaches.  Endo/Heme/Allergies: Negative.   All other systems reviewed and are negative.   Vitals:   09/18/17 1409  BP: 127/76  Pulse: 67  Resp: 18  Temp: 97.9 F (36.6 C)  SpO2: 97%    Physical Exam  Constitutional: He is oriented to person, place, and time. He appears well-developed and well-nourished.  HENT:  Head: Normocephalic and atraumatic.  Eyes: Pupils are equal, round, and reactive to light. Conjunctivae and EOM are normal.  Neck: Normal range of motion. Neck supple.  Cardiovascular: Normal rate, regular rhythm and normal heart sounds.  Pulmonary/Chest: Effort normal and breath sounds normal.  Abdominal: Soft. Bowel sounds are normal. He exhibits no distension. There is no tenderness.  Musculoskeletal: Normal range of motion.  Neurological: He is alert and oriented to person, place, and time.  Skin: Skin is warm and dry. Capillary refill takes  less than 2 seconds. Rash (Psoriatic rash to hands) noted.  Psychiatric: He has a normal mood and affect. His behavior is normal.  Vitals reviewed.   Results for orders placed or performed in visit on 09/18/17 (from the past 24 hour(s))  POCT urinalysis dipstick     Status: None   Collection Time: 09/18/17  2:28 PM  Result Value Ref Range   Color, UA yellow yellow   Clarity, UA clear clear   Glucose, UA negative negative mg/dL   Bilirubin, UA negative negative   Ketones, POC UA negative negative mg/dL   Spec Grav, UA 1.025 1.010 - 1.025   Blood, UA negative  negative   pH, UA 5.5 5.0 - 8.0   Protein Ur, POC negative negative mg/dL   Urobilinogen, UA 0.2 0.2 or 1.0 E.U./dL   Nitrite, UA Negative Negative   Leukocytes, UA Negative Negative   A total of 25 minutes was spent in the room with the patient, greater than 50% of which was in counseling/coordination of care regarding differential diagnosis, management, need for diagnostic work-up and need for follow-up.  ASSESSMENT & PLAN: Suleyman was seen today for flank pain and dysuria.  Diagnoses and all orders for this visit:  Flank pain -     POCT urinalysis dipstick -     CT RENAL STONE STUDY; Future  Dysuria -     POCT urinalysis dipstick -     Urine Culture  Rash and nonspecific skin eruption -     Ambulatory referral to Dermatology   Patient Instructions       If you have lab work done today you will be contacted with your lab results within the next 2 weeks.  If you have not heard from [place] then please contact us. The fastest way to get your results is to register for My Chart.   IF you received an x-ray today, you will receive an invoice from [facility]. Please contact [facility] at [phone] with questions or concerns regarding your invoice.   IF you received labwork today, you will receive an invoice from [facility]. Please contact [facility] at [phone] with questions or concerns regarding  your invoice.   Our billing staff will not be able to assist you with questions regarding bills from these companies.  You will be contacted with the lab results as soon as they are available. The fastest way to get your results is to activate your My Chart account. Instructions are located on the last page of this paperwork. If you have not heard from [place] regarding the results in 2 weeks, please contact this office.     Flank Pain Flank pain is pain in your side. The flank is the area of your side between your upper belly (abdomen) and your back. The pain may occur over a short period of time (acute) or may be long-term or come back often (chronic). It may be mild or very bad. Pain in this area can be caused by many different things. Follow these instructions at home:  Rest as told by your doctor.  Drink enough fluid to keep your pee (urine) clear or pale yellow.  Take over-the-counter and prescription medicines only as told by your doctor.  Keep all follow-up visits as told by your doctor. This is important. Contact a doctor if:  Medicine does not help your pain.  You have new symptoms.  Your pain gets worse.  You have a fever.  Your symptoms last longer than 2-3 days. Get help right away if:  Your tummy hurts or is swollen.  You are short of breath.  You feel sick to your stomach (nauseous) and it does not go away.  You cannot stop throwing up (vomiting).  You feel like you will pass out or you do pass out (faint).  You have blood in your pee.  You have a fever and your symptoms suddenly get worse. This information is not intended to replace advice given to you by your health care provider. Make sure you discuss any questions you have with your health care provider. Document Released: 10/16/2007 Document Revised: 09/28/2015 Document Reviewed: 10/10/2014 Elsevier Interactive Patient Education  2018 Elsevier Inc.      Agustina Caroli, MD [facility]

## 2017-09-18 NOTE — Patient Instructions (Addendum)
     If you have lab work done today you will be contacted with your lab results within the next 2 weeks.  If you have not heard from us then please contact us. The fastest way to get your results is to register for My Chart.   IF you received an x-ray today, you will receive an invoice from [facility]. Please contact  Radiology at [phone] with questions or concerns regarding your invoice.   IF you received labwork today, you will receive an invoice from [facility]. Please contact [facility] at [phone] with questions or concerns regarding your invoice.   Our billing staff will not be able to assist you with questions regarding bills from these companies.  You will be contacted with the lab results as soon as they are available. The fastest way to get your results is to activate your My Chart account. Instructions are located on the last page of this paperwork. If you have not heard from us regarding the results in 2 weeks, please contact this office.     Flank Pain Flank pain is pain in your side. The flank is the area of your side between your upper belly (abdomen) and your back. The pain may occur over a short period of time (acute) or may be long-term or come back often (chronic). It may be mild or very bad. Pain in this area can be caused by many different things. Follow these instructions at home:  Rest as told by your doctor.  Drink enough fluid to keep your pee (urine) clear or pale yellow.  Take over-the-counter and prescription medicines only as told by your doctor.  Keep all follow-up visits as told by your doctor. This is important. Contact a doctor if:  Medicine does not help your pain.  You have new symptoms.  Your pain gets worse.  You have a fever.  Your symptoms last longer than 2-3 days. Get help right away if:  Your tummy hurts or is swollen.  You are short of breath.  You feel sick to your stomach (nauseous) and it does not go  away.  You cannot stop throwing up (vomiting).  You feel like you will pass out or you do pass out (faint).  You have blood in your pee.  You have a fever and your symptoms suddenly get worse. This information is not intended to replace advice given to you by your health care provider. Make sure you discuss any questions you have with your health care provider. Document Released: 10/16/2007 Document Revised: 09/28/2015 Document Reviewed: 10/10/2014 Elsevier Interactive Patient Education  2018 Elsevier Inc.  

## 2017-09-18 NOTE — Telephone Encounter (Signed)
Patient is aware of results. Nothing further needed.  

## 2017-09-19 LAB — URINE CULTURE: Organism ID, Bacteria: NO GROWTH

## 2017-09-23 ENCOUNTER — Encounter: Payer: Self-pay | Admitting: *Deleted

## 2017-10-13 ENCOUNTER — Ambulatory Visit: Payer: Self-pay | Admitting: Internal Medicine

## 2017-10-19 ENCOUNTER — Ambulatory Visit: Payer: Self-pay | Admitting: Pulmonary Disease

## 2017-10-19 ENCOUNTER — Ambulatory Visit (INDEPENDENT_AMBULATORY_CARE_PROVIDER_SITE_OTHER): Payer: 59 | Admitting: Pulmonary Disease

## 2017-10-19 ENCOUNTER — Encounter: Payer: Self-pay | Admitting: Pulmonary Disease

## 2017-10-19 VITALS — BP 110/80 | HR 76 | Ht 71.0 in | Wt 242.0 lb

## 2017-10-19 DIAGNOSIS — G4733 Obstructive sleep apnea (adult) (pediatric): Secondary | ICD-10-CM | POA: Diagnosis not present

## 2017-10-19 DIAGNOSIS — G4726 Circadian rhythm sleep disorder, shift work type: Secondary | ICD-10-CM | POA: Diagnosis not present

## 2017-10-19 MED ORDER — ZOLPIDEM TARTRATE 5 MG PO TABS: 5.0000 mg | ORAL_TABLET | Freq: Every evening | ORAL | 5 refills | Status: DC | PRN

## 2017-10-19 NOTE — Assessment & Plan Note (Signed)
Can start 5mg  of Ambien at night  Split night study to be ordered   Continue to work towards healthy weight and diet   Follow-up with our office after completing the split-night sleep study

## 2017-10-19 NOTE — Progress Notes (Signed)
_0  ID: Michael Lam, male    DOB: 06/21/1995, 22 y.o.   MRN: [id number]  Chief Complaint  Patient presents with  . Follow-up    OSA / HST results     Referring provider: Horald Pollen, *  HPI:  22 year old male never smoker initially referred to our office on 07/06/2017 for sleep evaluation.  Patient also carries a history of asthma.  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  PMH: Migraine Smoker/ Smoking History: Never smoker Maintenance: None Pt of: Dr. Annamaria Boots  Recent [facility] Encounters:   07/06/2017-office visit-[facility]  Epworth score today is 11.  Patient's been working for 7 months.  Patient was with girlfriend.  Told him snoring and witnessed apneas.  He is gained 56 pounds in the last 2 years.  Previous ENT surgery on tonsils. Plan: Home sleep study  10/19/2017  - Visit   22 year old patient presenting today for follow-up visit.  Patient was recently seen in June 04/2017 Home sleep study was ordered.  Sleep study did not show apnea.  AHI was 4.7.   Patient reports he still having issues worries waking up the [place] patient reports that his parents who he lives with her still reporting that he is having apneic episodes.  Patient is restless.  Patient reports she is having trouble sleeping.  Only sleeps about 6 to 7 hours a night.  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  The patient is reporting morning headaches.  Patient is currently not using any sort of sleep aid such as melatonin.  Patient reports he used to use 50 mg of hydroxyzine in the past for anxiety management.  Patient reports that this did not really help his sleep that much.  It did help him calm down to initiate sleep though.  Patient would like to have an in lab sleep study.    Tests:  02/18/2017-chest x-ray-both lungs are clear, normal chest examination  07/03/17-thyroid panel with TSH-normal   07/25/17-Home sleep  study- AHI 4.7, SaO2 low 81% with average of 95%  Chart Review:     Specialty Problems      Pulmonary Problems   Obstructive sleep apnea    07/25/17-Home sleep study- AHI 4.7, SaO2 low 81% with average of 95%          Allergies  Allergen Reactions  . Peanut Oil Rash  . Eggs Or Egg-Derived Products     Other reaction(s): Abdominal Pain  . Hydrocodone-Acetaminophen Rash    Rash on thigh and upper body with itching    Immunization History  Administered Date(s) Administered  . DTaP 11/13/1994, 03/27/1995, 06/02/1995, 12/22/1995, 11/05/1998  . Hepatitis A, Ped/Adol-2 Dose 07/28/2007  . Hepatitis B, ped/adol 11/13/1994, 03/27/1995, 06/02/1995  . HiB (PRP-OMP) 11/13/1994, 03/27/1995, 06/02/1995, 12/22/1995  . IPV 11/13/1994, 03/27/1995, 06/02/1995, 11/05/1998  . Influenza, Seasonal, Injecte, Preservative Fre 11/04/2015  . Influenza,inj,Quad PF,6+ Mos 10/19/2017  . Influenza-Unspecified 09/20/2016  . MMR 08/25/1995, 11/05/1998  . Meningococcal Conjugate 07/28/2007  . Td 07/28/2007  . Tdap 07/28/2007, 11/20/2016  . Varicella 08/25/1995, 10/24/2004    Past Medical History:  Diagnosis Date  . Allergy   . Anxiety   . Asthma     Tobacco History: Social History   Tobacco Use  Smoking Status Never Smoker  Smokeless Tobacco Never Used   Counseling given: Not Answered  Continue to not smoke.   Outpatient Encounter Medications as of 10/19/2017  Medication  Sig  . albuterol (PROVENTIL HFA;VENTOLIN HFA) 108 (90 Base) MCG/ACT inhaler Inhale 2 puffs into the lungs every 6 (six) hours as needed for wheezing or shortness of breath.  . beclomethasone (QVAR REDIHALER) 40 MCG/ACT inhaler Inhale 1 puff into the lungs 2 (two) times daily.  . blood glucose meter kit and supplies KIT Dispense based on patient and insurance preference. Use up to four times daily as directed. (FOR ICD-9 250.00, 250.01).  . cetirizine (ZYRTEC) 10 MG tablet Take 1 tablet (10 mg total) by mouth daily.  .  fluticasone (FLONASE) 50 MCG/ACT nasal spray Place 2 sprays daily into both nostrils.  . hydrOXYzine (VISTARIL) 50 MG capsule Take 1 capsule (50 mg total) by mouth at bedtime as needed.  . triamcinolone cream (KENALOG) 0.1 % Apply 1 application topically 2 (two) times daily.  Marland Kitchen zolpidem (AMBIEN) 5 MG tablet Take 1 tablet (5 mg total) by mouth at bedtime as needed for sleep.   No facility-administered encounter medications on file as of 10/19/2017.      Review of Systems  Review of Systems  Constitutional: Positive for fatigue. Negative for activity change, chills, fever and unexpected weight change.  HENT: Negative for postnasal drip, rhinorrhea, sinus pressure, sinus pain, sneezing and sore throat.   Eyes: Negative.   Respiratory: Negative for cough, shortness of breath and wheezing.   Cardiovascular: Negative for chest pain and palpitations.  Gastrointestinal: Negative for constipation, diarrhea, nausea and vomiting.  Endocrine: Negative.   Musculoskeletal: Negative.   Skin: Negative.   Neurological: Negative for dizziness and headaches.  Psychiatric/Behavioral: Positive for sleep disturbance. Negative for dysphoric mood. The patient is nervous/anxious.   All other systems reviewed and are negative.    Physical Exam  BP 110/80 (BP Location: Left Arm, Cuff Size: Normal)   Pulse 76   Ht '5\' 11"'$  (1.803 m)   Wt 242 lb (109.8 kg)   SpO2 96%   BMI 33.75 kg/m   Wt Readings from Last 5 Encounters:  10/19/17 242 lb (109.8 kg)  09/18/17 242 lb 3.2 oz (109.9 kg)  07/07/17 236 lb (107 kg)  07/06/17 239 lb 3.2 oz (108.5 kg)  07/03/17 236 lb (107 kg)     Physical Exam  Constitutional: He is oriented to person, place, and time and well-developed, well-nourished, and in no distress. No distress.  HENT:  Head: Normocephalic and atraumatic.  Right Ear: Hearing, tympanic membrane, external ear and ear canal normal.  Left Ear: Hearing, tympanic membrane, external ear and ear canal  normal.  Nose: Nose normal. Right sinus exhibits no maxillary sinus tenderness and no frontal sinus tenderness. Left sinus exhibits no maxillary sinus tenderness and no frontal sinus tenderness.  Mouth/Throat: Uvula is midline and oropharynx is clear and moist. No oropharyngeal exudate.  Eyes: Pupils are equal, round, and reactive to light.  Neck: Normal range of motion. Neck supple. No JVD present.  Cardiovascular: Normal rate, regular rhythm and normal heart sounds.  Pulmonary/Chest: Effort normal and breath sounds normal. No accessory muscle usage. No respiratory distress. He has no decreased breath sounds. He has no wheezes. He has no rhonchi.  Musculoskeletal: Normal range of motion. He exhibits no edema.  Lymphadenopathy:    He has no cervical adenopathy.  Neurological: He is alert and oriented to person, place, and time. Gait normal.  Skin: Skin is warm and dry. He is not diaphoretic. No erythema.  Psychiatric: Mood, memory, affect and judgment normal.  Nursing note and vitals reviewed.     Lab  Results:  CBC    Component Value Date/Time   WBC 7.0 07/03/2017 1521   WBC 5.5 10/05/2015 1546   RBC 5.31 07/03/2017 1521   RBC 5.15 10/05/2015 1546   HGB 14.5 07/03/2017 1521   HCT 44.2 07/03/2017 1521   PLT 188 07/03/2017 1521   MCV 83 07/03/2017 1521   MCH 27.3 07/03/2017 1521   MCH 27.6 10/05/2015 1546   MCHC 32.8 07/03/2017 1521   MCHC 33.6 10/05/2015 1546   RDW 13.8 07/03/2017 1521   LYMPHSABS 2.1 07/03/2017 1521   EOSABS 0.2 07/03/2017 1521   BASOSABS 0.0 07/03/2017 1521    BMET    Component Value Date/Time   NA 142 07/03/2017 1521   K 4.8 07/03/2017 1521   CL 106 07/03/2017 1521   CO2 24 07/03/2017 1521   GLUCOSE 88 07/03/2017 1521   GLUCOSE 99 04/13/2015 1734   BUN 17 07/03/2017 1521   CREATININE 0.86 07/03/2017 1521   CREATININE 0.97 04/13/2015 1734   CALCIUM 9.6 07/03/2017 1521   GFRNONAA 123 07/03/2017 1521   GFRNONAA >89 04/13/2015 1734   GFRAA 142  07/03/2017 1521   GFRAA >89 04/13/2015 1734    BNP No results found for: BNP  ProBNP No results found for: PROBNP  Imaging: No results found.    Assessment & Plan:   22 year old patient seen today for follow-up visit.  Will order split-night sleep study.  This will help further address patient's questionable apneic episodes as well as difficulty maintaining initiating sleep.  We will also start patient on 5 mg of Ambien at night.  Prescription provided today.  This will ensure the patient is adequately sleeping for the sleep study as well.  Patient to contact our office if he has any sort of concerns regarding the Ambien once he starts it.  Patient agreed.  Encourage patient to consider changing work shifts to the daytime hours.  Also emphasized the importance of sleep hygiene.  Ensuring that the patient is limiting screen time at night.  Patient also encouraged to keep sleep diary in order to keep better sleep cycle.  Shift work sleep disorder Can start 35m of Ambien at night  Split night study to be ordered   Continue to work towards healthy weight and diet   Follow-up with our office after completing the split-night sleep study   Obstructive sleep apnea Split night study to be ordered   Continue to work towards healthy weight and diet   Follow-up with our office after completing the split-night sleep study   This appointment was 28 minutes along with her 50% of the time direct face-to-face patient care, assessment, plan of care follow-up.  Also involvement discussion with Dr. YAnnamaria Bootsregarding plan of care moving forward. I have discussed this case with Dr. YAnnamaria Boots  He agrees with plan of care moving forward.  BLauraine Rinne NP 10/19/2017

## 2017-10-19 NOTE — Patient Instructions (Addendum)
Can start 5mg  of Ambien at night  Split night study to be ordered   Continue to work towards healthy weight and diet   Follow-up with our office after completing the split-night sleep study   It is flu season:   >>>Remember to be washing your hands regularly, using hand sanitizer, be careful to use around herself with has contact with people who are sick will increase her chances of getting sick yourself. >>> Best ways to protect herself from the flu: Receive the yearly flu vaccine, practice good hand hygiene washing with soap and also using hand sanitizer when available, eat a nutritious meals, get adequate rest, hydrate appropriately    As of 11/23/2017 we will be moving! We will no longer be at our [place] location.   Our new address and phone number will be:  [address] [place], [place] [address] Telephone number: [phone]   Please contact the office if your symptoms worsen or you have concerns that you are not improving.   Thank you for choosing [facility] for your healthcare, and for allowing [place] to partner with you on your healthcare journey. I am thankful to be able to provide care to you today.   Elisha Headland FNP-C

## 2017-10-19 NOTE — Assessment & Plan Note (Signed)
Split night study to be ordered   Continue to work towards healthy weight and diet   Follow-up with our office after completing the split-night sleep study

## 2017-10-28 ENCOUNTER — Ambulatory Visit: Payer: 59 | Admitting: Emergency Medicine

## 2017-11-19 ENCOUNTER — Encounter (HOSPITAL_BASED_OUTPATIENT_CLINIC_OR_DEPARTMENT_OTHER): Payer: Self-pay

## 2017-11-23 ENCOUNTER — Telehealth: Payer: Self-pay | Admitting: Emergency Medicine

## 2017-11-23 NOTE — Telephone Encounter (Signed)
Copied from CRM [id number]. Topic: Quick Communication - See Telephone Encounter >> Nov 23, 2017  9:56 AM Lenoria Chime wrote: CRM for notification. See Telephone encounter for: 11/23/17. PT stated he had called last month about getting his FMLA papers renewed. He has appointment in the morning.

## 2017-11-23 NOTE — Telephone Encounter (Signed)
There is no telephone encounter or CRM in for this patient from last month about his FMLA forms. He can talk to provider at his appointment on 11/24/17 about renewing the forms.

## 2017-11-24 ENCOUNTER — Ambulatory Visit (INDEPENDENT_AMBULATORY_CARE_PROVIDER_SITE_OTHER): Payer: 59 | Admitting: Emergency Medicine

## 2017-11-24 ENCOUNTER — Ambulatory Visit: Payer: 59 | Admitting: Emergency Medicine

## 2017-11-24 ENCOUNTER — Encounter: Payer: Self-pay | Admitting: Emergency Medicine

## 2017-11-24 VITALS — BP 114/70 | HR 73 | Temp 99.2°F | Resp 16 | Wt 240.0 lb

## 2017-11-24 DIAGNOSIS — R21 Rash and other nonspecific skin eruption: Secondary | ICD-10-CM | POA: Diagnosis not present

## 2017-11-24 DIAGNOSIS — Z8709 Personal history of other diseases of the respiratory system: Secondary | ICD-10-CM | POA: Diagnosis not present

## 2017-11-24 DIAGNOSIS — J309 Allergic rhinitis, unspecified: Secondary | ICD-10-CM | POA: Diagnosis not present

## 2017-11-24 DIAGNOSIS — R05 Cough: Secondary | ICD-10-CM

## 2017-11-24 DIAGNOSIS — R053 Chronic cough: Secondary | ICD-10-CM

## 2017-11-24 MED ORDER — AZELASTINE HCL 0.1 % NA SOLN: 2.0000 | Freq: Two times a day (BID) | NASAL | 12 refills | Status: DC

## 2017-11-24 MED ORDER — ALBUTEROL SULFATE HFA 108 (90 BASE) MCG/ACT IN AERS: 2.0000 | INHALATION_SPRAY | Freq: Four times a day (QID) | RESPIRATORY_TRACT | 0 refills | Status: DC | PRN

## 2017-11-24 MED ORDER — TRIAMCINOLONE ACETONIDE 0.1 % EX CREA: 1.0000 "application " | TOPICAL_CREAM | Freq: Two times a day (BID) | CUTANEOUS | 0 refills | Status: DC

## 2017-11-24 MED ORDER — BECLOMETHASONE DIPROP HFA 40 MCG/ACT IN AERB: 1.0000 | INHALATION_SPRAY | Freq: Two times a day (BID) | RESPIRATORY_TRACT | 11 refills | Status: DC

## 2017-11-24 NOTE — Patient Instructions (Addendum)
   If you have lab work done today you will be contacted with your lab results within the next 2 weeks.  If you have not heard from us then please contact us. The fastest way to get your results is to register for My Chart.   IF you received an x-ray today, you will receive an invoice from [facility]. Please contact  Radiology at [phone] with questions or concerns regarding your invoice.   IF you received labwork today, you will receive an invoice from [facility]. Please contact [facility] at [phone] with questions or concerns regarding your invoice.   Our billing staff will not be able to assist you with questions regarding bills from these companies.  You will be contacted with the lab results as soon as they are available. The fastest way to get your results is to activate your My Chart account. Instructions are located on the last page of this paperwork. If you have not heard from us regarding the results in 2 weeks, please contact this office.     Allergic Rhinitis, Adult Allergic rhinitis is an allergic reaction that affects the mucous membrane inside the nose. It causes sneezing, a runny or stuffy nose, and the feeling of mucus going down the back of the throat (postnasal drip). Allergic rhinitis can be mild to severe. There are two types of allergic rhinitis:  Seasonal. This type is also called hay fever. It happens only during certain seasons.  Perennial. This type can happen at any time of the year.  What are the causes? This condition happens when the body's defense system (immune system) responds to certain harmless substances called allergens as though they were germs.  Seasonal allergic rhinitis is triggered by pollen, which can come from grasses, trees, and weeds. Perennial allergic rhinitis may be caused by:  House dust mites.  Pet dander.  Mold spores.  What are the signs or symptoms? Symptoms of this condition  include:  Sneezing.  Runny or stuffy nose (nasal congestion).  Postnasal drip.  Itchy nose.  Tearing of the eyes.  Trouble sleeping.  Daytime sleepiness.  How is this diagnosed? This condition may be diagnosed based on:  Your medical history.  A physical exam.  Tests to check for related conditions, such as: ? Asthma. ? Pink eye. ? Ear infection. ? Upper respiratory infection.  Tests to find out which allergens trigger your symptoms. These may include skin or blood tests.  How is this treated? There is no cure for this condition, but treatment can help control symptoms. Treatment may include:  Taking medicines that block allergy symptoms, such as antihistamines. Medicine may be given as a shot, nasal spray, or pill.  Avoiding the allergen.  Desensitization. This treatment involves getting ongoing shots until your body becomes less sensitive to the allergen. This treatment may be done if other treatments do not help.  If taking medicine and avoiding the allergen does not work, new, stronger medicines may be prescribed.  Follow these instructions at home:  Find out what you are allergic to. Common allergens include smoke, dust, and pollen.  Avoid the things you are allergic to. These are some things you can do to help avoid allergens: ? Replace carpet with wood, tile, or vinyl flooring. Carpet can trap dander and dust. ? Do not smoke. Do not allow smoking in your home. ? Change your heating and air conditioning filter at least once a month. ? During allergy season:  Keep windows closed as much as   possible.  Plan outdoor activities when pollen counts are lowest. This is usually during the evening hours.  When coming indoors, change clothing and shower before sitting on furniture or bedding.  Take over-the-counter and prescription medicines only as told by your health care provider.  Keep all follow-up visits as told by your health care provider. This is  important. Contact a health care provider if:  You have a fever.  You develop a persistent cough.  You make whistling sounds when you breathe (you wheeze).  Your symptoms interfere with your normal daily activities. Get help right away if:  You have shortness of breath. Summary  This condition can be managed by taking medicines as directed and avoiding allergens.  Contact your health care provider if you develop a persistent cough or fever.  During allergy season, keep windows closed as much as possible. This information is not intended to replace advice given to you by your health care provider. Make sure you discuss any questions you have with your health care provider. Document Released: 10/01/2000 Document Revised: 02/14/2016 Document Reviewed: 02/14/2016 Elsevier Interactive Patient Education  2018 [employer] Inc[employer]  

## 2017-11-24 NOTE — Progress Notes (Signed)
Michael Lam 22 y.o.   Chief Complaint  Patient presents with  . Medication Refill    albuterol, QVAR, Triamcinolone Cream and medication for allergies  . FMLA    RENEWAL    HISTORY OF PRESENT ILLNESS: This is a 22 y.o. male with history of chronic allergies mostly seasonal here for follow-up and medication refills.  States last weekend he had a lot of runny nose and sneezing.  No other significant symptoms.  HPI   Prior to Admission medications   Medication Sig Start Date End Date Taking? Authorizing Provider  albuterol (PROVENTIL HFA;VENTOLIN HFA) 108 (90 Base) MCG/ACT inhaler Inhale 2 puffs into the lungs every 6 (six) hours as needed for wheezing or shortness of breath. 05/19/17  Yes Pailynn Vahey, Ines Bloomer, MD  beclomethasone (QVAR REDIHALER) 40 MCG/ACT inhaler Inhale 1 puff into the lungs 2 (two) times daily. 05/19/17  Yes Chosen Geske, Ines Bloomer, MD  blood glucose meter kit and supplies KIT Dispense based on patient and insurance preference. Use up to four times daily as directed. (FOR ICD-9 250.00, 250.01). 07/03/17  Yes Zanai Mallari, Ines Bloomer, MD  cetirizine (ZYRTEC) 10 MG tablet Take 1 tablet (10 mg total) by mouth daily. 04/10/17  Yes Shamarra Warda, Ines Bloomer, MD  fluticasone [facility]) 50 MCG/ACT nasal spray Place 2 sprays daily into both nostrils. 11/25/16  Yes Jaynee Eagles, PA-C  triamcinolone cream (KENALOG) 0.1 % Apply 1 application topically 2 (two) times daily. 06/17/17  Yes Rotunda Worden, Ines Bloomer, MD  hydrOXYzine (VISTARIL) 50 MG capsule Take 1 capsule (50 mg total) by mouth at bedtime as needed. Patient not taking: Reported on 11/24/2017 10/05/15   Jaynee Eagles, PA-C  zolpidem (AMBIEN) 5 MG tablet Take 1 tablet (5 mg total) by mouth at bedtime as needed for sleep. 10/19/17 11/18/17  Lauraine Rinne, NP    Allergies  Allergen Reactions  . Peanut Oil Rash  . Eggs Or Egg-Derived Products     Other reaction(s): Abdominal Pain  . Hydrocodone-Acetaminophen Rash    Rash on  thigh and upper body with itching    Patient Active Problem List   Diagnosis Date Noted  . Shift work sleep disorder 07/06/2017  . Obstructive sleep apnea 07/06/2017  . Fatigue 07/03/2017  . Dizziness 07/03/2017  . History of hypoglycemia 07/03/2017  . Ganglion cyst of foot 06/17/2017  . History of asthma 05/19/2017  . Chronic migraine without aura without status migrainosus, not intractable 12/31/2016  . Other headache syndrome 12/09/2016  . Anxiety 04/13/2015    Past Medical History:  Diagnosis Date  . Allergy   . Anxiety   . Asthma     Past Surgical History:  Procedure Laterality Date  . TONSILLECTOMY      Social History   Socioeconomic History  . Marital status: Single    Spouse name: Not on file  . Number of children: 0  . Years of education: Not on file  . Highest education level: Associate degree: academic program  Occupational History    Employer: [employer]  . Financial resource strain: Not on file  . Food insecurity:    Worry: Not on file    Inability: Not on file  . Transportation needs:    Medical: Not on file    Non-medical: Not on file  Tobacco Use  . Smoking status: Never Smoker  . Smokeless tobacco: Never Used  Substance and Sexual Activity  . Alcohol use: No    Alcohol/week: 0.0 standard drinks  . Drug use: No  .  Sexual activity: Not on file  Lifestyle  . Physical activity:    Days per week: Not on file    Minutes per session: Not on file  . Stress: Not on file  Relationships  . Social connections:    Talks on phone: Not on file    Gets together: Not on file    Attends religious service: Not on file    Active member of club or organization: Not on file    Attends meetings of clubs or organizations: Not on file    Relationship status: Not on file  . Intimate partner violence:    Fear of current or ex partner: Not on file    Emotionally abused: Not on file    Physically abused: Not on file    Forced sexual activity:  Not on file  Other Topics Concern  . 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 Hyperlipidemia Father   . Hypertension Father   . Diabetes Father   . Diabetes Maternal Grandmother   . Migraines Maternal Grandmother   . Stroke Maternal Grandfather   . Hypertension Maternal Grandfather   . Stroke Paternal Grandfather      Review of Systems  Constitutional: Negative.  Negative for chills and fever.  HENT: Positive for congestion. Negative for nosebleeds.        Runny nose and sneezing.  Eyes: Negative for blurred vision and double vision.  Respiratory: Negative.  Negative for cough, hemoptysis and shortness of breath.   Cardiovascular: Negative for chest pain and palpitations.  Gastrointestinal: Negative for abdominal pain, nausea and vomiting.  Genitourinary: Negative.  Negative for dysuria and hematuria.  Musculoskeletal: Negative.  Negative for back pain, myalgias and neck pain.  Skin: Negative.  Negative for rash.  Neurological: Negative.  Negative for dizziness and headaches.  Endo/Heme/Allergies: Negative.   All other systems reviewed and are negative.  Vitals:   11/24/17 1603  BP: 114/70  Pulse: 73  Resp: 16  Temp: 99.2 F (37.3 C)  SpO2: 98%     Physical Exam  Constitutional: He is oriented to person, place, and time. He appears well-developed and well-nourished.  HENT:  Head: Normocephalic and atraumatic.  Nose: Nose normal.  Mouth/Throat: Oropharynx is clear and moist.  Eyes: Pupils are equal, round, and reactive to light. Conjunctivae and EOM are normal.  Neck: Normal range of motion. Neck supple.  Cardiovascular: Normal rate, regular rhythm and normal heart sounds.  Pulmonary/Chest: Effort normal and breath sounds normal.  Musculoskeletal: Normal range of motion.  Neurological: He  is alert and oriented to person, place, and time. No sensory deficit. He exhibits normal muscle tone.  Skin: Skin is warm and dry. Capillary refill takes less than 2 seconds.  Psychiatric: He has a normal mood and affect. His behavior is normal.  Vitals reviewed.    ASSESSMENT & PLAN: Habeeb was seen today for medication refill and fmla.  Diagnoses and all orders for this visit:  Chronic allergic rhinitis -     Ambulatory referral to Allergy -     azelastine (ASTELIN) 0.1 % nasal spray; Place 2 sprays into both nostrils 2 (two) times daily. Use in each nostril as directed  Rash and nonspecific skin eruption -  triamcinolone cream (KENALOG) 0.1 %; Apply 1 application topically 2 (two) times daily.  History of asthma -     albuterol (PROVENTIL HFA;VENTOLIN HFA) 108 (90 Base) MCG/ACT inhaler; Inhale 2 puffs into the lungs every 6 (six) hours as needed for wheezing or shortness of breath. -     beclomethasone (QVAR REDIHALER) 40 MCG/ACT inhaler; Inhale 1 puff into the lungs 2 (two) times daily.  Chronic cough -     beclomethasone (QVAR REDIHALER) 40 MCG/ACT inhaler; Inhale 1 puff into the lungs 2 (two) times daily.   Patient Instructions       If you have lab work done today you will be contacted with your lab results within the next 2 weeks.  If you have not heard from [place] then please contact us. The fastest way to get your results is to register for My Chart.   IF you received an x-ray today, you will receive an invoice from [facility]. Please contact [facility] at [phone] with questions or concerns regarding your invoice.   IF you received labwork today, you will receive an invoice from [facility]. Please contact [facility] at [phone] with questions or concerns regarding your invoice.   Our billing staff will not be able to assist you with questions regarding bills from these companies.  You will be contacted with the lab results as soon as they  are available. The fastest way to get your results is to activate your My Chart account. Instructions are located on the last page of this paperwork. If you have not heard from [place] regarding the results in 2 weeks, please contact this office.    Allergic Rhinitis, Adult Allergic rhinitis is an allergic reaction that affects the mucous membrane inside the nose. It causes sneezing, a runny or stuffy nose, and the feeling of mucus going down the back of the throat (postnasal drip). Allergic rhinitis can be mild to severe. There are two types of allergic rhinitis:  Seasonal. This type is also called hay fever. It happens only during certain seasons.  Perennial. This type can happen at any time of the year.  What are the causes? This condition happens when the body's defense system (immune system) responds to certain harmless substances called allergens as though they were germs.  Seasonal allergic rhinitis is triggered by pollen, which can come from grasses, trees, and weeds. Perennial allergic rhinitis may be caused by:  House dust mites.  Pet dander.  Mold spores.  What are the signs or symptoms? Symptoms of this condition include:  Sneezing.  Runny or stuffy nose (nasal congestion).  Postnasal drip.  Itchy nose.  Tearing of the eyes.  Trouble sleeping.  Daytime sleepiness.  How is this diagnosed? This condition may be diagnosed based on:  Your medical history.  A physical exam.  Tests to check for related conditions, such as: ? Asthma. ? Pink eye. ? Ear infection. ? Upper respiratory infection.  Tests to find out which allergens trigger your symptoms. These may include skin or blood tests.  How is this treated? There is no cure for this condition, but treatment can help control symptoms. Treatment may include:  Taking medicines that block allergy symptoms, such as antihistamines. Medicine may be given as a shot, nasal spray, or pill.  Avoiding the  allergen.  Desensitization. This treatment involves getting ongoing shots until your body becomes less sensitive to the allergen. This treatment may be done if other treatments do not help.  If taking medicine and avoiding  the allergen does not work, new, stronger medicines may be prescribed.  Follow these instructions at home:  Find out what you are allergic to. Common allergens include smoke, dust, and pollen.  Avoid the things you are allergic to. These are some things you can do to help avoid allergens: ? Replace carpet with wood, tile, or vinyl flooring. Carpet can trap dander and dust. ? Do not smoke. Do not allow smoking in your home. ? Change your heating and air conditioning filter at least once a month. ? During allergy season:  Keep windows closed as much as possible.  Plan outdoor activities when pollen counts are lowest. This is usually during the evening hours.  When coming indoors, change clothing and shower before sitting on furniture or bedding.  Take over-the-counter and prescription medicines only as told by your health care provider.  Keep all follow-up visits as told by your health care provider. This is important. Contact a health care provider if:  You have a fever.  You develop a persistent cough.  You make whistling sounds when you breathe (you wheeze).  Your symptoms interfere with your normal daily activities. Get help right away if:  You have shortness of breath. Summary  This condition can be managed by taking medicines as directed and avoiding allergens.  Contact your health care provider if you develop a persistent cough or fever.  During allergy season, keep windows closed as much as possible. This information is not intended to replace advice given to you by your health care provider. Make sure you discuss any questions you have with your health care provider. Document Released: 10/01/2000 Document Revised: 02/14/2016 Document Reviewed:  02/14/2016 Elsevier Interactive Patient Education  2018 [employer].      Agustina Caroli, MD [facility]

## 2017-12-23 ENCOUNTER — Telehealth: Payer: Self-pay | Admitting: Emergency Medicine

## 2017-12-23 NOTE — Telephone Encounter (Signed)
Spoke to patient and adv him of his appnt w/dermatologist and gave him the ph# for allergist referral because they could not contact him      Copied from CRM [id number]. Topic: Referral - Status >> Dec 23, 2017  1:13 PM Lynne LoganHudson, Caryn D wrote: Reason for CRM: Pt state he was supposed to have referrals to a Dermatologist and an Allergist and he has not hear anything. The dermatologist referral was from 3 months ago. Please advise. VZ#[phone]#[phone]

## 2017-12-24 ENCOUNTER — Encounter: Payer: Self-pay | Admitting: Emergency Medicine

## 2017-12-25 ENCOUNTER — Other Ambulatory Visit: Payer: Self-pay | Admitting: Emergency Medicine

## 2017-12-25 DIAGNOSIS — R059 Cough, unspecified: Secondary | ICD-10-CM

## 2017-12-25 DIAGNOSIS — R05 Cough: Secondary | ICD-10-CM

## 2017-12-25 MED ORDER — BENZONATATE 200 MG PO CAPS: 200.0000 mg | ORAL_CAPSULE | Freq: Two times a day (BID) | ORAL | 0 refills | Status: DC | PRN

## 2017-12-25 MED ORDER — PROMETHAZINE-DM 6.25-15 MG/5ML PO SYRP: 5.0000 mL | ORAL_SOLUTION | Freq: Four times a day (QID) | ORAL | 0 refills | Status: DC | PRN

## 2017-12-28 ENCOUNTER — Encounter (HOSPITAL_COMMUNITY): Payer: Self-pay | Admitting: Emergency Medicine

## 2017-12-28 ENCOUNTER — Encounter

## 2017-12-28 ENCOUNTER — Ambulatory Visit (HOSPITAL_BASED_OUTPATIENT_CLINIC_OR_DEPARTMENT_OTHER): Payer: 59 | Attending: Pulmonary Disease

## 2017-12-28 ENCOUNTER — Ambulatory Visit (HOSPITAL_COMMUNITY)
Admission: EM | Admit: 2017-12-28 | Discharge: 2017-12-28 | Disposition: A | Discharge status: Home / Self Care | Payer: 59 | Attending: Internal Medicine | Admitting: Internal Medicine

## 2017-12-28 ENCOUNTER — Ambulatory Visit (INDEPENDENT_AMBULATORY_CARE_PROVIDER_SITE_OTHER): Payer: 59

## 2017-12-28 DIAGNOSIS — R05 Cough: Secondary | ICD-10-CM

## 2017-12-28 DIAGNOSIS — J320 Chronic maxillary sinusitis: Secondary | ICD-10-CM

## 2017-12-28 MED ORDER — ONDANSETRON HCL 4 MG PO TABS: 4.0000 mg | ORAL_TABLET | Freq: Three times a day (TID) | ORAL | 0 refills | Status: DC | PRN

## 2017-12-28 MED ORDER — CEFDINIR 300 MG PO CAPS: 300.0000 mg | ORAL_CAPSULE | Freq: Two times a day (BID) | ORAL | 0 refills | Status: AC

## 2017-12-28 NOTE — ED Provider Notes (Signed)
[place]    CSN: [id number] Arrival date & time: 12/28/17  1240     History   Chief Complaint Chief Complaint  Patient presents with  . URI    HPI Michael Lam is a 22 y.o. male.   Who presents with unresolved cough x 2 weeks. Two nights ago developed fever and body aches. The rhinitis got worse and is coughing green sputum. Also has L ear pain and face pain on his sinuses. His PCP called him in [place] and cough med for night time. He gets sick frequently with respiratory issues. Has not addressed this with his PCP. Has been using his inhaler prn wheezing and cough. Has had diarrhea on average 2-3 BMs per day. No blood in stool. Has felt nauseaous and vomited a few times. Has continued going to work, although he was still sick. " I took Tylenol".     Past Medical History:  Diagnosis Date  . Allergy   . Anxiety   . Asthma     Patient Active Problem List   Diagnosis Date Noted  . Chronic allergic rhinitis 11/24/2017  . Shift work sleep disorder 07/06/2017  . Obstructive sleep apnea 07/06/2017  . Fatigue 07/03/2017  . Dizziness 07/03/2017  . History of hypoglycemia 07/03/2017  . Ganglion cyst of foot 06/17/2017  . History of asthma 05/19/2017  . Chronic migraine without aura without status migrainosus, not intractable 12/31/2016  . Other headache syndrome 12/09/2016  . Anxiety 04/13/2015    Past Surgical History:  Procedure Laterality Date  . TONSILLECTOMY         Home Medications    Prior to Admission medications   Medication Sig Start Date End Date Taking? Authorizing Provider  albuterol (PROVENTIL HFA;VENTOLIN HFA) 108 (90 Base) MCG/ACT inhaler Inhale 2 puffs into the lungs every 6 (six) hours as needed for wheezing or shortness of breath. 11/24/17   Horald Pollen, MD  azelastine (ASTELIN) 0.1 % nasal spray Place 2 sprays into both nostrils 2 (two) times daily. Use in each nostril as directed 11/24/17   Horald Pollen, MD  beclomethasone (QVAR REDIHALER) 40 MCG/ACT inhaler Inhale 1 puff into the lungs 2 (two) times daily. 11/24/17   Horald Pollen, MD  benzonatate (TESSALON) 200 MG capsule Take 1 capsule (200 mg total) by mouth 2 (two) times daily as needed for cough. 12/25/17   Horald Pollen, MD  blood glucose meter kit and supplies KIT Dispense based on patient and insurance preference. Use up to four times daily as directed. (FOR ICD-9 250.00, 250.01). 07/03/17   Horald Pollen, MD  cefdinir (OMNICEF) 300 MG capsule Take 1 capsule (300 mg total) by mouth 2 (two) times daily for 7 days. 12/28/17 01/04/18  Rodriguez-Southworth, Sunday Spillers, PA-C  cetirizine (ZYRTEC) 10 MG tablet Take 1 tablet (10 mg total) by mouth daily. 04/10/17   Horald Pollen, MD  fluticasone [facility]) 50 MCG/ACT nasal spray Place 2 sprays daily into both nostrils. 11/25/16   Jaynee Eagles, PA-C  ondansetron (ZOFRAN) 4 MG tablet Take 1 tablet (4 mg total) by mouth every 8 (eight) hours as needed for nausea or vomiting. 12/28/17   Rodriguez-Southworth, Sunday Spillers, PA-C  promethazine-dextromethorphan (PROMETHAZINE-DM) 6.25-15 MG/5ML syrup Take 5 mLs by mouth 4 (four) times daily as needed for cough. 12/25/17   Horald Pollen, MD  triamcinolone cream (KENALOG) 0.1 % Apply 1 application topically 2 (two) times daily. 11/24/17   Horald Pollen, MD  zolpidem (AMBIEN) 5 MG tablet  Take 1 tablet (5 mg total) by mouth at bedtime as needed for sleep. 10/19/17 11/18/17  Lauraine Rinne, NP    Family History Family History  Problem Relation Age of Onset  . Hyperlipidemia Father   . Hypertension Father   . Diabetes Father   . Diabetes Maternal Grandmother   . Migraines Maternal Grandmother   . Stroke Maternal Grandfather   . Hypertension Maternal Grandfather   . Stroke Paternal Grandfather     Social History Social History   Tobacco Use  . Smoking status: Never Smoker  . Smokeless tobacco: Never Used  Substance Use  Topics  . Alcohol use: No    Alcohol/week: 0.0 standard drinks  . Drug use: No     Allergies   Peanut oil; Eggs or egg-derived products; and Hydrocodone-acetaminophen   Review of Systems Review of Systems  Constitutional: Positive for appetite change, chills, fatigue and fever. Negative for diaphoresis.  HENT: Positive for congestion, ear pain, postnasal drip, rhinorrhea, sinus pressure, sinus pain and sore throat. Negative for ear discharge, facial swelling, mouth sores and trouble swallowing.   Eyes: Negative for discharge.  Respiratory: Positive for cough and wheezing. Negative for chest tightness and shortness of breath.        He has had hemoptysis.   Cardiovascular: Negative for chest pain.  Gastrointestinal: Positive for diarrhea, nausea and vomiting. Negative for abdominal pain and blood in stool.       N/V/D x 2 days. The vomit provokes the vomiting.   Genitourinary: Negative for difficulty urinating.  Musculoskeletal: Positive for myalgias. Negative for gait problem, neck pain and neck stiffness.  Skin: Negative for rash.  Neurological: Positive for headaches.  Hematological: Positive for adenopathy.     Physical Exam Triage Vital Signs ED Triage Vitals [12/28/17 1333]  Enc Vitals Group     BP (!) 141/80     Pulse Rate 92     Resp 18     Temp 98.6 F (37 C)     Temp Source Oral     SpO2 99 %     Weight      Height      Head Circumference      Peak Flow      Pain Score 7     Pain Loc      Pain Edu?      Excl. in [place]?    No data found.  Updated Vital Signs BP (!) 141/80 (BP Location: Right Arm)   Pulse 92   Temp 98.6 F (37 C) (Oral)   Resp 18   SpO2 99%   Visual Acuity Right Eye Distance:   Left Eye Distance:   Bilateral Distance:    Right Eye Near:   Left Eye Near:    Bilateral Near:     Physical Exam  Constitutional: He is oriented to person, place, and time. He appears well-developed and well-nourished. No distress.  HENT:  Head:  Normocephalic.  Right Ear: External ear normal.  Left Ear: External ear normal.  Mouth/Throat: No oropharyngeal exudate.  Pharynx is slightly erythematous. Uvula is mid line  Sinuses are tender on maxillary bilaterally. Nose mucous is green.   Eyes: Pupils are equal, round, and reactive to light. Conjunctivae are normal. Right eye exhibits no discharge. Left eye exhibits no discharge. No scleral icterus.  Neck: Neck supple. No tracheal deviation present.  Cardiovascular: Normal rate and regular rhythm.  No murmur heard. Pulmonary/Chest: Effort normal and breath sounds normal. No respiratory distress.  He has no wheezes. He has no rales.  His cough sounds wheezy. Neg egophony.   Abdominal: Soft. Bowel sounds are normal. He exhibits no distension and no mass. There is tenderness. There is no rebound and no guarding.  Has mild diffuse tenderness on both lower quadrants  Musculoskeletal: Normal range of motion.  Lymphadenopathy:    He has no cervical adenopathy.  Neurological: He is alert and oriented to person, place, and time.  Skin: Skin is warm and dry. No rash noted. He is not diaphoretic.  Psychiatric: He has a normal mood and affect. His behavior is normal. Judgment and thought content normal.  Nursing note and vitals reviewed.    UC Treatments / Results  Labs (all labs ordered are listed, but only abnormal results are displayed) Labs Reviewed - No data to display  EKG None  Radiology Dg Chest 2 View  Result Date: 12/28/2017 CLINICAL DATA:  Productive cough for 2 weeks EXAM: CHEST - 2 VIEW COMPARISON:  05/19/2017 FINDINGS: The heart size and mediastinal contours are within normal limits. Both lungs are clear. The visualized skeletal structures are unremarkable. IMPRESSION: No active cardiopulmonary disease. Electronically Signed   By: Inez Catalina M.D.   On: 12/28/2017 14:42   Medications Ordered in UC Medications - No data to display  Initial Impression / Assessment and  Plan / UC Course  I have reviewed the triage vital signs and the nursing notes. I suspect he has bacterial sinusitis. I placed him on Cefdinir as noted. Advised to do saline nose rinses bid for a few days. See instructions.   Needs to follow BRAT diet. OK to take Pepto. I sent Zofran prn nausea.  Needs to discuss with his PCP about frequent infections and get labs done.  Pertinent  imaging results that were available during my care of the patient were reviewed by me and considered in my medical decision making (see chart for details).      Final Clinical Impressions(s) / UC Diagnoses   Final diagnoses:  Maxillary sinusitis, unspecified chronicity     Discharge Instructions     If you are able, so saline nose rinses twice a day for 5 days, but do not use tap water. Watch [employer] use instructions on You[employer]  Use your inhaler every 4-6 hours for 3-5 days, then as needed for cough and wheezing.       ED Prescriptions    Medication Sig Dispense Auth. Provider   cefdinir (OMNICEF) 300 MG capsule Take 1 capsule (300 mg total) by mouth 2 (two) times daily for 7 days. 14 capsule Rodriguez-Southworth, Isobelle Tuckett, PA-C   ondansetron (ZOFRAN) 4 MG tablet Take 1 tablet (4 mg total) by mouth every 8 (eight) hours as needed for nausea or vomiting. 20 tablet Rodriguez-Southworth, Sunday Spillers, PA-C     Controlled Substance Prescriptions [place] Controlled Substance Registry consulted?    Shelby Mattocks, PA-C 12/28/17 1505

## 2017-12-28 NOTE — Discharge Instructions (Addendum)
If you are able, so saline nose rinses twice a day for 5 days, but do not use tap water. Watch [employer] use instructions on You[employer]  Use your inhaler every 4-6 hours for 3-5 days, then as needed for cough and wheezing.

## 2017-12-28 NOTE — ED Triage Notes (Signed)
Pt here for URI sx x 2 weeks and cough; pt sts some fever

## 2017-12-29 ENCOUNTER — Encounter

## 2017-12-29 ENCOUNTER — Ambulatory Visit: Payer: 59 | Admitting: Emergency Medicine

## 2017-12-30 ENCOUNTER — Emergency Department (HOSPITAL_BASED_OUTPATIENT_CLINIC_OR_DEPARTMENT_OTHER)
Admission: EM | Admit: 2017-12-30 | Discharge: 2017-12-30 | Disposition: A | Discharge status: Home / Self Care | Payer: 59 | Attending: Emergency Medicine | Admitting: Emergency Medicine

## 2017-12-30 ENCOUNTER — Other Ambulatory Visit: Payer: Self-pay

## 2017-12-30 ENCOUNTER — Encounter (HOSPITAL_BASED_OUTPATIENT_CLINIC_OR_DEPARTMENT_OTHER): Payer: Self-pay | Admitting: Emergency Medicine

## 2017-12-30 DIAGNOSIS — F419 Anxiety disorder, unspecified: Secondary | ICD-10-CM | POA: Diagnosis not present

## 2017-12-30 DIAGNOSIS — J45909 Unspecified asthma, uncomplicated: Secondary | ICD-10-CM | POA: Insufficient documentation

## 2017-12-30 DIAGNOSIS — Z79899 Other long term (current) drug therapy: Secondary | ICD-10-CM | POA: Insufficient documentation

## 2017-12-30 DIAGNOSIS — J111 Influenza due to unidentified influenza virus with other respiratory manifestations: Secondary | ICD-10-CM | POA: Diagnosis not present

## 2017-12-30 DIAGNOSIS — R509 Fever, unspecified: Secondary | ICD-10-CM | POA: Diagnosis present

## 2017-12-30 DIAGNOSIS — Z91010 Allergy to peanuts: Secondary | ICD-10-CM | POA: Insufficient documentation

## 2017-12-30 DIAGNOSIS — R5383 Other fatigue: Secondary | ICD-10-CM | POA: Diagnosis not present

## 2017-12-30 DIAGNOSIS — R69 Illness, unspecified: Secondary | ICD-10-CM

## 2017-12-30 LAB — CBC WITH DIFFERENTIAL/PLATELET
Abs Immature Granulocytes: 0.01 10*3/uL (ref 0.00–0.07)
Basophils Absolute: 0 10*3/uL (ref 0.0–0.1)
Basophils Relative: 0 %
Eosinophils Absolute: 0.1 10*3/uL (ref 0.0–0.5)
Eosinophils Relative: 1 %
HCT: 43.5 % (ref 39.0–52.0)
Hemoglobin: 13.8 g/dL (ref 13.0–17.0)
Immature Granulocytes: 0 %
Lymphocytes Relative: 23 %
Lymphs Abs: 1.5 10*3/uL (ref 0.7–4.0)
MCH: 27 pg (ref 26.0–34.0)
MCHC: 31.7 g/dL (ref 30.0–36.0)
MCV: 85.1 fL (ref 80.0–100.0)
Monocytes Absolute: 1 10*3/uL (ref 0.1–1.0)
Monocytes Relative: 16 %
Neutro Abs: 3.9 10*3/uL (ref 1.7–7.7)
Neutrophils Relative %: 60 %
Platelets: 146 10*3/uL — ABNORMAL LOW (ref 150–400)
RBC: 5.11 MIL/uL (ref 4.22–5.81)
RDW: 12.4 % (ref 11.5–15.5)
WBC: 6.5 10*3/uL (ref 4.0–10.5)
nRBC: 0 % (ref 0.0–0.2)

## 2017-12-30 LAB — BASIC METABOLIC PANEL
Anion gap: 5 (ref 5–15)
BUN: 12 mg/dL (ref 6–20)
CO2: 24 mmol/L (ref 22–32)
Calcium: 8.4 mg/dL — ABNORMAL LOW (ref 8.9–10.3)
Chloride: 106 mmol/L (ref 98–111)
Creatinine, Ser: 0.93 mg/dL (ref 0.61–1.24)
GFR calc Af Amer: >60 mL/min (ref >60)
GFR calc non Af Amer: >60 mL/min (ref >60)
Glucose, Bld: 105 mg/dL — ABNORMAL HIGH (ref 70–99)
Potassium: 3.9 mmol/L (ref 3.5–5.1)
Sodium: 135 mmol/L (ref 135–145)

## 2017-12-30 MED ORDER — SODIUM CHLORIDE 0.9 % IV BOLUS (SEPSIS)
1000.0000 mL | Freq: Once | INTRAVENOUS | Status: AC
  Administered 2017-12-30: 1000 mL via INTRAVENOUS

## 2017-12-30 MED ORDER — IBUPROFEN 400 MG PO TABS
400.0000 mg | ORAL_TABLET | Freq: Once | ORAL | Status: AC
  Administered 2017-12-30: 400 mg via ORAL
  Filled 2017-12-30: qty 1

## 2017-12-30 MED ORDER — ONDANSETRON HCL 4 MG/2ML IJ SOLN
4.0000 mg | Freq: Once | INTRAMUSCULAR | Status: AC
Start: 2017-12-30 — End: 2017-12-30
  Administered 2017-12-30: 4 mg via INTRAVENOUS
  Filled 2017-12-30: qty 2

## 2017-12-30 NOTE — ED Triage Notes (Signed)
Cough, congestion, fever, body aches, sorethroat.

## 2017-12-30 NOTE — ED Provider Notes (Signed)
[facility] EMERGENCY DEPARTMENT Provider Note   CSN: [id number] Arrival date & time: 12/30/17  0543     History   Chief Complaint Chief Complaint  Patient presents with  . URI    HPI Michael Lam is a 22 y.o. male.  The history is provided by the patient.  URI   This is a new problem. The current episode started more than 2 days ago. The problem has been gradually worsening. The maximum temperature recorded prior to his arrival was 101 to 101.9 F. Associated symptoms include diarrhea, nausea, vomiting, congestion, ear pain, headaches, sore throat and cough. Pertinent negatives include no chest pain. Treatments tried: antibiotics. The treatment provided no relief.  Patient reports cough and cold symptoms for up to 2 weeks. However, about 4 days ago he began feeling feverish.  He has had diaphoresis.  He reports cough/sore throat/body aches/headache.  He is also having ear pain Also had vomiting and diarrhea.  He was seen about 2 days ago for these complaints and was diagnosed with sinus infection and given Omnicef He reports he is feeling worse.  Past Medical History:  Diagnosis Date  . Allergy   . Anxiety   . Asthma     Patient Active Problem List   Diagnosis Date Noted  . Chronic allergic rhinitis 11/24/2017  . Shift work sleep disorder 07/06/2017  . Obstructive sleep apnea 07/06/2017  . Fatigue 07/03/2017  . Dizziness 07/03/2017  . History of hypoglycemia 07/03/2017  . Ganglion cyst of foot 06/17/2017  . History of asthma 05/19/2017  . Chronic migraine without aura without status migrainosus, not intractable 12/31/2016  . Other headache syndrome 12/09/2016  . Anxiety 04/13/2015    Past Surgical History:  Procedure Laterality Date  . TONSILLECTOMY          Home Medications    Prior to Admission medications   Medication Sig Start Date End Date Taking? Authorizing Provider  albuterol (PROVENTIL HFA;VENTOLIN HFA) 108 (90 Base)  MCG/ACT inhaler Inhale 2 puffs into the lungs every 6 (six) hours as needed for wheezing or shortness of breath. 11/24/17   Horald Pollen, MD  azelastine (ASTELIN) 0.1 % nasal spray Place 2 sprays into both nostrils 2 (two) times daily. Use in each nostril as directed 11/24/17   Horald Pollen, MD  beclomethasone (QVAR REDIHALER) 40 MCG/ACT inhaler Inhale 1 puff into the lungs 2 (two) times daily. 11/24/17   Horald Pollen, MD  benzonatate (TESSALON) 200 MG capsule Take 1 capsule (200 mg total) by mouth 2 (two) times daily as needed for cough. 12/25/17   Horald Pollen, MD  blood glucose meter kit and supplies KIT Dispense based on patient and insurance preference. Use up to four times daily as directed. (FOR ICD-9 250.00, 250.01). 07/03/17   Horald Pollen, MD  cefdinir (OMNICEF) 300 MG capsule Take 1 capsule (300 mg total) by mouth 2 (two) times daily for 7 days. 12/28/17 01/04/18  Rodriguez-Southworth, Sunday Spillers, PA-C  cetirizine (ZYRTEC) 10 MG tablet Take 1 tablet (10 mg total) by mouth daily. 04/10/17   Horald Pollen, MD  fluticasone [facility]) 50 MCG/ACT nasal spray Place 2 sprays daily into both nostrils. 11/25/16   Jaynee Eagles, PA-C  ondansetron (ZOFRAN) 4 MG tablet Take 1 tablet (4 mg total) by mouth every 8 (eight) hours as needed for nausea or vomiting. 12/28/17   Rodriguez-Southworth, Sunday Spillers, PA-C  promethazine-dextromethorphan (PROMETHAZINE-DM) 6.25-15 MG/5ML syrup Take 5 mLs by mouth 4 (four) times daily as needed  for cough. 12/25/17   Horald Pollen, MD  triamcinolone cream (KENALOG) 0.1 % Apply 1 application topically 2 (two) times daily. 11/24/17   Horald Pollen, MD  zolpidem (AMBIEN) 5 MG tablet Take 1 tablet (5 mg total) by mouth at bedtime as needed for sleep. 10/19/17 11/18/17  Lauraine Rinne, NP    Family History Family History  Problem Relation Age of Onset  . Hyperlipidemia Father   . Hypertension Father   . Diabetes Father   .  Diabetes Maternal Grandmother   . Migraines Maternal Grandmother   . Stroke Maternal Grandfather   . Hypertension Maternal Grandfather   . Stroke Paternal Grandfather     Social History Social History   Tobacco Use  . Smoking status: Never Smoker  . Smokeless tobacco: Never Used  Substance Use Topics  . Alcohol use: No    Alcohol/week: 0.0 standard drinks  . Drug use: No     Allergies   Peanut oil; Eggs or egg-derived products; and Hydrocodone-acetaminophen   Review of Systems Review of Systems  Constitutional: Positive for diaphoresis, fatigue and fever.  HENT: Positive for congestion, ear pain and sore throat.   Respiratory: Positive for cough.   Cardiovascular: Negative for chest pain.  Gastrointestinal: Positive for diarrhea, nausea and vomiting.  Neurological: Positive for headaches.  All other systems reviewed and are negative.    Physical Exam Updated Vital Signs BP (!) 117/91 (BP Location: Left Arm)   Pulse (!) 116   Temp (!) 101 F (38.3 C) (Oral)   Resp 18   Ht 1.803 m (_0 )   Wt 106.6 kg   SpO2 97%   BMI 32.78 kg/m   Physical Exam  CONSTITUTIONAL: Well developed/well nourished HEAD: Normocephalic/atraumatic EYES: EOMI/PERRL ENMT: Mucous membranes moist, uvula midline with erythema, no exudates.  Bilateral TMs clear and intact. NECK: supple no meningeal signs SPINE/BACK:entire spine nontender CV: S1/S2 noted, no murmurs/rubs/gallops noted LUNGS: Lungs are clear to auscultation bilaterally, no apparent distress, no crackles or wheeze noted ABDOMEN: soft, nontender, no rebound or guarding, bowel sounds noted throughout abdomen GU:no cva tenderness NEURO: Pt is awake/alert/appropriate, moves all extremitiesx4.  No facial droop.   EXTREMITIES: pulses normal/equal, full ROM SKIN: warm, color normal PSYCH: no abnormalities of mood noted, alert and oriented to situation  ED Treatments / Results  Labs (all labs ordered are listed, but only  abnormal results are displayed) Labs Reviewed  CBC WITH DIFFERENTIAL/PLATELET  BASIC METABOLIC PANEL  INFLUENZA PANEL BY PCR (TYPE A & B)    EKG None  Radiology Dg Chest 2 View  Result Date: 12/28/2017 CLINICAL DATA:  Productive cough for 2 weeks EXAM: CHEST - 2 VIEW COMPARISON:  05/19/2017 FINDINGS: The heart size and mediastinal contours are within normal limits. Both lungs are clear. The visualized skeletal structures are unremarkable. IMPRESSION: No active cardiopulmonary disease. Electronically Signed   By: Inez Catalina M.D.   On: 12/28/2017 14:42    Procedures Procedures   Medications Ordered in ED Medications  sodium chloride 0.9 % bolus 1,000 mL (1,000 mLs Intravenous New Bag/Given 12/30/17 0645)  ibuprofen (ADVIL,MOTRIN) tablet 400 mg (400 mg Oral Given 12/30/17 0611)  ondansetron (ZOFRAN) injection 4 mg (4 mg Intravenous Given 12/30/17 0645)     Initial Impression / Assessment and Plan / ED Course  I have reviewed the triage vital signs and the nursing notes.  Pertinent labs & previous imaging results that were available during my care of the patient were reviewed by me  and considered in my medical decision making (see chart for details).     6:36 AM Patient presents with flulike illness for several days.  He was initially diagnosed with sinusitis and given Omnicef, however this time it appears more consistent with influenza.  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  No recent travel.  He is otherwise healthy at baseline He is tachycardic and febrile.  IV fluids have been ordered. 7:03 AM Pt stable at this time At time of signout to dr little, f/u on labs and reassess pt Pt is considering starting tamiflu Final Clinical Impressions(s) / ED Diagnoses   Final diagnoses:  Influenza-like illness    ED Discharge Orders    None       Ripley Fraise, MD 12/30/17 [phone]

## 2017-12-30 NOTE — ED Provider Notes (Signed)
I received this patient in signout from Dr. Bebe ShaggyWickline.  He had presented with a fever and flulike symptoms, obtained screening lab work which was reassuring.  At time of my signout, patient had been given IV fluids and antipyretic and we were awaiting clinical improvement.  On reassessment, patient's fever and tachycardia have improved.  He states that he feels better and has been tolerating p.o.  He feels comfortable going home.  Already has Zofran and cough medications as well as Tylenol at home.  I discussed supportive measures and extensively reviewed return precautions.  He voiced understanding.   Little, Ambrose Finlandachel Morgan, MD 12/30/17 [phone]

## 2018-01-24 DIAGNOSIS — L301 Dyshidrosis [pompholyx]: Secondary | ICD-10-CM | POA: Diagnosis not present

## 2018-01-28 ENCOUNTER — Encounter: Payer: Self-pay | Admitting: Emergency Medicine

## 2018-01-29 ENCOUNTER — Ambulatory Visit: Payer: Self-pay

## 2018-01-29 NOTE — Telephone Encounter (Signed)
Pt. Reports he has had dizziness x 1 week. States he can be sitting still and feel dizzy. If he moves his head fast. Sometimes he feels like the room is spinning.Has a pressure to the back of his head at times.Nausea at intervals. Appointment made for Monday. Instructed if symptoms worsen over the weekend, go to ED.Verbalizes understanding. Reason for Disposition . [1] MODERATE dizziness (e.g., vertigo; feels very unsteady, interferes with normal activities) AND [2] has been evaluated by physician for this  Answer Assessment - Initial Assessment Questions 1. DESCRIPTION: "Describe your dizziness."     Dizzy 2. VERTIGO: "Do you feel like either you or the room is spinning or tilting?"      Sometimes 3. LIGHTHEADED: "Do you feel lightheaded?" (e.g., somewhat faint, woozy, weak upon standing)     Dizzy 4. SEVERITY: "How bad is it?"  "Can you walk?"   - MILD - Feels unsteady but walking normally.   - MODERATE - Feels very unsteady when walking, but not falling; interferes with normal activities (e.g., school, work) .   - SEVERE - Unable to walk without falling (requires assistance).     Mild 5. ONSET:  "When did the dizziness begin?"     1-2 weeks 6. AGGRAVATING FACTORS: "Does anything make it worse?" (e.g., standing, change in head position)     Moving head 7. CAUSE: "What do you think is causing the dizziness?"     Unsure 8. RECURRENT SYMPTOM: "Have you had dizziness before?" If so, ask: "When was the last time?" "What happened that time?"     No 9. OTHER SYMPTOMS: "Do you have any other symptoms?" (e.g., headache, weakness, numbness, vomiting, earache)     Nausea, Pressure to back of head 10. PREGNANCY: "Is there any chance you are pregnant?" "When was your last menstrual period?"       N/a  Protocols used: DIZZINESS - VERTIGO-A-AH

## 2018-02-01 ENCOUNTER — Encounter: Payer: Self-pay | Admitting: Emergency Medicine

## 2018-02-01 ENCOUNTER — Other Ambulatory Visit: Payer: Self-pay

## 2018-02-01 ENCOUNTER — Ambulatory Visit (INDEPENDENT_AMBULATORY_CARE_PROVIDER_SITE_OTHER): Payer: 59 | Admitting: Emergency Medicine

## 2018-02-01 VITALS — BP 108/73 | HR 68 | Temp 98.7°F | Resp 18 | Ht 70.87 in | Wt 232.4 lb

## 2018-02-01 DIAGNOSIS — M79601 Pain in right arm: Secondary | ICD-10-CM | POA: Diagnosis not present

## 2018-02-01 DIAGNOSIS — G8929 Other chronic pain: Secondary | ICD-10-CM

## 2018-02-01 DIAGNOSIS — R51 Headache: Secondary | ICD-10-CM | POA: Diagnosis not present

## 2018-02-01 DIAGNOSIS — G4459 Other complicated headache syndrome: Secondary | ICD-10-CM | POA: Diagnosis not present

## 2018-02-01 DIAGNOSIS — R519 Headache, unspecified: Secondary | ICD-10-CM

## 2018-02-01 DIAGNOSIS — R110 Nausea: Secondary | ICD-10-CM

## 2018-02-01 DIAGNOSIS — R42 Dizziness and giddiness: Secondary | ICD-10-CM | POA: Diagnosis not present

## 2018-02-01 NOTE — Progress Notes (Signed)
Michael Lam 23 y.o.   Chief Complaint  Patient presents with  . Dizziness    X 1.5 weeks- pt states right arm heavy,pressure on right side of head with some nausea    HISTORY OF PRESENT ILLNESS: This is a 23 y.o. male complaining of intermittent episodes of dizziness for the past several months along with headaches.  Recently noticed heaviness in his right arm with intermittent nausea.  Medical history includes sleep apnea, history of asthma, migraine headaches, chronic allergic rhinitis.  No other significant symptoms at this time.  HPI   Prior to Admission medications   Medication Sig Start Date End Date Taking? Authorizing Provider  albuterol (PROVENTIL HFA;VENTOLIN HFA) 108 (90 Base) MCG/ACT inhaler Inhale 2 puffs into the lungs every 6 (six) hours as needed for wheezing or shortness of breath. 11/24/17  Yes Marvis Saefong, Ines Bloomer, MD  azelastine (ASTELIN) 0.1 % nasal spray Place 2 sprays into both nostrils 2 (two) times daily. Use in each nostril as directed 11/24/17  Yes Ventura Hollenbeck, Ines Bloomer, MD  beclomethasone (QVAR REDIHALER) 40 MCG/ACT inhaler Inhale 1 puff into the lungs 2 (two) times daily. 11/24/17  Yes Dang Mathison, Ines Bloomer, MD  blood glucose meter kit and supplies KIT Dispense based on patient and insurance preference. Use up to four times daily as directed. (FOR ICD-9 250.00, 250.01). 07/03/17  Yes Daegon Deiss, Ines Bloomer, MD  cetirizine (ZYRTEC) 10 MG tablet Take 1 tablet (10 mg total) by mouth daily. 04/10/17  Yes Jalen Daluz, Ines Bloomer, MD  fluticasone [facility]) 50 MCG/ACT nasal spray Place 2 sprays daily into both nostrils. 11/25/16  Yes Jaynee Eagles, PA-C  ondansetron (ZOFRAN) 4 MG tablet Take 1 tablet (4 mg total) by mouth every 8 (eight) hours as needed for nausea or vomiting. 12/28/17  Yes Rodriguez-Southworth, Sunday Spillers, PA-C  triamcinolone cream (KENALOG) 0.1 % Apply 1 application topically 2 (two) times daily. 11/24/17  Yes Adraine Biffle, Ines Bloomer, MD  benzonatate  (TESSALON) 200 MG capsule Take 1 capsule (200 mg total) by mouth 2 (two) times daily as needed for cough. Patient not taking: Reported on 02/01/2018 12/25/17   Horald Pollen, MD  promethazine-dextromethorphan (PROMETHAZINE-DM) 6.25-15 MG/5ML syrup Take 5 mLs by mouth 4 (four) times daily as needed for cough. Patient not taking: Reported on 02/01/2018 12/25/17   Horald Pollen, MD  zolpidem (AMBIEN) 5 MG tablet Take 1 tablet (5 mg total) by mouth at bedtime as needed for sleep. 10/19/17 11/18/17  Lauraine Rinne, NP    Allergies  Allergen Reactions  . Peanut Oil Rash  . Eggs Or Egg-Derived Products     Other reaction(s): Abdominal Pain  . Hydrocodone-Acetaminophen Rash    Rash on thigh and upper body with itching    Patient Active Problem List   Diagnosis Date Noted  . Chronic allergic rhinitis 11/24/2017  . Shift work sleep disorder 07/06/2017  . Obstructive sleep apnea 07/06/2017  . Fatigue 07/03/2017  . Dizziness 07/03/2017  . History of hypoglycemia 07/03/2017  . Ganglion cyst of foot 06/17/2017  . History of asthma 05/19/2017  . Chronic migraine without aura without status migrainosus, not intractable 12/31/2016  . Other headache syndrome 12/09/2016  . Anxiety 04/13/2015    Past Medical History:  Diagnosis Date  . Allergy   . Anxiety   . Asthma     Past Surgical History:  Procedure Laterality Date  . TONSILLECTOMY      Social History   Socioeconomic History  . Marital status: Single    Spouse name:  Not on file  . Number of children: 0  . Years of education: Not on file  . Highest education level: Associate degree: academic program  Occupational History    Employer: [employer]  . Financial resource strain: Not on file  . Food insecurity:    Worry: Not on file    Inability: Not on file  . Transportation needs:    Medical: Not on file    Non-medical: Not on file  Tobacco Use  . Smoking status: Never Smoker  . Smokeless tobacco:  Never Used  Substance and Sexual Activity  . Alcohol use: No    Alcohol/week: 0.0 standard drinks  . Drug use: No  . Sexual activity: Not Currently  Lifestyle  . Physical activity:    Days per week: Not on file    Minutes per session: Not on file  . Stress: Not on file  Relationships  . Social connections:    Talks on phone: Not on file    Gets together: Not on file    Attends religious service: Not on file    Active member of club or organization: Not on file    Attends meetings of clubs or organizations: Not on file    Relationship status: Not on file  . Intimate partner violence:    Fear of current or ex partner: Not on file    Emotionally abused: Not on file    Physically abused: Not on file    Forced sexual activity: Not on file  Other Topics Concern  . 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 Hyperlipidemia Father   . Hypertension Father   . Diabetes Father   . Diabetes Maternal Grandmother   . Migraines Maternal Grandmother   . Stroke Maternal Grandfather   . Hypertension Maternal Grandfather   . Stroke Paternal Grandfather      Review of Systems  Constitutional: Positive for malaise/fatigue. Negative for chills, fever and weight loss.  HENT: Negative.  Negative for congestion, ear pain, hearing loss, nosebleeds, sore throat and tinnitus.   Eyes: Negative.  Negative for blurred vision and double vision.  Respiratory: Negative for cough and shortness of breath.   Cardiovascular: Negative.  Negative for chest pain and palpitations.  Gastrointestinal: Positive for nausea.  Genitourinary: Positive for flank pain.  Skin: Positive for rash (Intermittent).  Neurological: Positive for dizziness and headaches.    Vitals:   02/01/18 1506  BP: 108/73  Pulse: 68  Resp: 18  Temp: 98.7 F  (37.1 C)  SpO2: 95%    Physical Exam Vitals signs reviewed.  Constitutional:      Appearance: Normal appearance.  HENT:     Head: Normocephalic and atraumatic.     Right Ear: Tympanic membrane, ear canal and external ear normal.     Left Ear: Tympanic membrane, ear canal and external ear normal.     Nose: Nose normal.     Mouth/Throat:     Mouth: Mucous membranes are moist.     Pharynx: Oropharynx is clear.  Eyes:     Extraocular Movements: Extraocular movements intact.     Conjunctiva/sclera: Conjunctivae normal.     Pupils: Pupils are equal, round, and reactive to light.  Neck:  Musculoskeletal: Normal range of motion and neck supple.  Cardiovascular:     Rate and Rhythm: Normal rate and regular rhythm.     Pulses: Normal pulses.     Heart sounds: Normal heart sounds.  Pulmonary:     Effort: Pulmonary effort is normal.     Breath sounds: Normal breath sounds.  Abdominal:     General: There is no distension.     Palpations: Abdomen is soft. There is no mass.     Tenderness: There is no abdominal tenderness.  Musculoskeletal: Normal range of motion.  Skin:    General: Skin is warm and dry.  Neurological:     General: No focal deficit present.     Mental Status: He is alert and oriented to person, place, and time.     Cranial Nerves: No cranial nerve deficit.     Sensory: No sensory deficit.     Motor: No weakness.     Coordination: Coordination normal.     Gait: Gait normal.     Deep Tendon Reflexes: Reflexes normal.  Psychiatric:        Mood and Affect: Mood normal.        Behavior: Behavior normal.      ASSESSMENT & PLAN: Taaj was seen today for dizziness.  Diagnoses and all orders for this visit:  Dizziness  Chronic nonintractable headache, unspecified headache type  Nausea without vomiting  Right arm pain  Other complicated headache syndrome -     MR Brain W Wo Contrast; Future    Patient Instructions       If you have lab work  done today you will be contacted with your lab results within the next 2 weeks.  If you have not heard from [place] then please contact us. The fastest way to get your results is to register for My Chart.   IF you received an x-ray today, you will receive an invoice from [facility]. Please contact [facility] at [phone] with questions or concerns regarding your invoice.   IF you received labwork today, you will receive an invoice from [facility]. Please contact [facility] at [phone] with questions or concerns regarding your invoice.   Our billing staff will not be able to assist you with questions regarding bills from these companies.  You will be contacted with the lab results as soon as they are available. The fastest way to get your results is to activate your My Chart account. Instructions are located on the last page of this paperwork. If you have not heard from [place] regarding the results in 2 weeks, please contact this office.     Dizziness Dizziness is a common problem. It makes you feel unsteady or light-headed. You may feel like you are about to pass out (faint). Dizziness can lead to getting hurt if you stumble or fall. Dizziness can be caused by many things, including:  Medicines.  Not having enough water in your body (dehydration).  Illness. Follow these instructions at home: Eating and drinking   Drink enough fluid to keep your pee (urine) clear or pale yellow. This helps to keep you from getting dehydrated. Try to drink more clear fluids, such as water.  Do not drink alcohol.  Limit how much caffeine you drink or eat, if your doctor tells you to do that.  Limit how much salt (sodium) you drink or eat, if your doctor tells you to do that. Activity   Avoid making quick movements. ? When you stand up  from sitting in a chair, steady yourself until you feel okay. ? In the morning, first sit up on the side of the bed. When you feel okay, stand slowly  while you hold onto something. Do this until you know that your balance is fine.  If you need to stand in one place for a long time, move your legs often. Tighten and relax the muscles in your legs while you are standing.  Do not drive or use heavy machinery if you feel dizzy.  Avoid bending down if you feel dizzy. Place items in your home so you can reach them easily without leaning over. Lifestyle  Do not use any products that contain nicotine or tobacco, such as cigarettes and e-cigarettes. If you need help quitting, ask your doctor.  Try to lower your stress level. You can do this by using methods such as yoga or meditation. Talk with your doctor if you need help. General instructions  Watch your dizziness for any changes.  Take over-the-counter and prescription medicines only as told by your doctor. Talk with your doctor if you think that you are dizzy because of a medicine that you are taking.  Tell a friend or a family member that you are feeling dizzy. If he or she notices any changes in your behavior, have this person call your doctor.  Keep all follow-up visits as told by your doctor. This is important. Contact a doctor if:  Your dizziness does not go away.  Your dizziness or light-headedness gets worse.  You feel sick to your stomach (nauseous).  You have trouble hearing.  You have new symptoms.  You are unsteady on your feet.  You feel like the room is spinning. Get help right away if:  You throw up (vomit) or have watery poop (diarrhea), and you cannot eat or drink anything.  You have trouble: ? Talking. ? Walking. ? Swallowing. ? Using your arms, hands, or legs.  You feel generally weak.  You are not thinking clearly, or you have trouble forming sentences. A friend or family member may notice this.  You have: ? Chest pain. ? Pain in your belly (abdomen). ? Shortness of breath. ? Sweating.  Your vision changes.  You are bleeding.  You have a  very bad headache.  You have neck pain or a stiff neck.  You have a fever. These symptoms may be an emergency. Do not wait to see if the symptoms will go away. Get medical help right away. Call your local emergency services (911 in the [place] Do not drive yourself to the hospital. Summary  Dizziness makes you feel unsteady or light-headed. You may feel like you are about to pass out (faint).  Drink enough fluid to keep your pee (urine) clear or pale yellow. Do not drink alcohol.  Avoid making quick movements if you feel dizzy.  Watch your dizziness for any changes. This information is not intended to replace advice given to you by your health care provider. Make sure you discuss any questions you have with your health care provider. Document Released: 12/26/2010 Document Revised: 01/24/2016 Document Reviewed: 01/24/2016 Elsevier Interactive Patient Education  2019 Elsevier Inc.      Agustina Caroli, MD [facility]

## 2018-02-01 NOTE — Patient Instructions (Addendum)
   If you have lab work done today you will be contacted with your lab results within the next 2 weeks.  If you have not heard from us then please contact us. The fastest way to get your results is to register for My Chart.   IF you received an x-ray today, you will receive an invoice from [facility]. Please contact [facility] at [phone] with questions or concerns regarding your invoice.   IF you received labwork today, you will receive an invoice from [facility]. Please contact [facility] at [phone] with questions or concerns regarding your invoice.   Our billing staff will not be able to assist you with questions regarding bills from these companies.  You will be contacted with the lab results as soon as they are available. The fastest way to get your results is to activate your My Chart account. Instructions are located on the last page of this paperwork. If you have not heard from us regarding the results in 2 weeks, please contact this office.      Dizziness Dizziness is a common problem. It makes you feel unsteady or light-headed. You may feel like you are about to pass out (faint). Dizziness can lead to getting hurt if you stumble or fall. Dizziness can be caused by many things, including:  Medicines.  Not having enough water in your body (dehydration).  Illness. Follow these instructions at home: Eating and drinking   Drink enough fluid to keep your pee (urine) clear or pale yellow. This helps to keep you from getting dehydrated. Try to drink more clear fluids, such as water.  Do not drink alcohol.  Limit how much caffeine you drink or eat, if your doctor tells you to do that.  Limit how much salt (sodium) you drink or eat, if your doctor tells you to do that. Activity   Avoid making quick movements. ? When you stand up from sitting in a chair, steady yourself until you feel okay. ? In the morning, first sit up on the side of the bed. When  you feel okay, stand slowly while you hold onto something. Do this until you know that your balance is fine.  If you need to stand in one place for a long time, move your legs often. Tighten and relax the muscles in your legs while you are standing.  Do not drive or use heavy machinery if you feel dizzy.  Avoid bending down if you feel dizzy. Place items in your home so you can reach them easily without leaning over. Lifestyle  Do not use any products that contain nicotine or tobacco, such as cigarettes and e-cigarettes. If you need help quitting, ask your doctor.  Try to lower your stress level. You can do this by using methods such as yoga or meditation. Talk with your doctor if you need help. General instructions  Watch your dizziness for any changes.  Take over-the-counter and prescription medicines only as told by your doctor. Talk with your doctor if you think that you are dizzy because of a medicine that you are taking.  Tell a friend or a family member that you are feeling dizzy. If he or she notices any changes in your behavior, have this person call your doctor.  Keep all follow-up visits as told by your doctor. This is important. Contact a doctor if:  Your dizziness does not go away.  Your dizziness or light-headedness gets worse.  You feel sick to your stomach (nauseous).    You have trouble hearing.  You have new symptoms.  You are unsteady on your feet.  You feel like the room is spinning. Get help right away if:  You throw up (vomit) or have watery poop (diarrhea), and you cannot eat or drink anything.  You have trouble: ? Talking. ? Walking. ? Swallowing. ? Using your arms, hands, or legs.  You feel generally weak.  You are not thinking clearly, or you have trouble forming sentences. A friend or family member may notice this.  You have: ? Chest pain. ? Pain in your belly (abdomen). ? Shortness of breath. ? Sweating.  Your vision changes.  You  are bleeding.  You have a very bad headache.  You have neck pain or a stiff neck.  You have a fever. These symptoms may be an emergency. Do not wait to see if the symptoms will go away. Get medical help right away. Call your local emergency services (911 in the [place] Do not drive yourself to the hospital. Summary  Dizziness makes you feel unsteady or light-headed. You may feel like you are about to pass out (faint).  Drink enough fluid to keep your pee (urine) clear or pale yellow. Do not drink alcohol.  Avoid making quick movements if you feel dizzy.  Watch your dizziness for any changes. This information is not intended to replace advice given to you by your health care provider. Make sure you discuss any questions you have with your health care provider. Document Released: 12/26/2010 Document Revised: 01/24/2016 Document Reviewed: 01/24/2016 Elsevier Interactive Patient Education  2019 Elsevier Inc.  

## 2018-02-02 ENCOUNTER — Encounter: Payer: Self-pay | Admitting: Emergency Medicine

## 2018-02-02 DIAGNOSIS — M79601 Pain in right arm: Secondary | ICD-10-CM | POA: Insufficient documentation

## 2018-02-02 DIAGNOSIS — G8929 Other chronic pain: Secondary | ICD-10-CM | POA: Insufficient documentation

## 2018-02-02 DIAGNOSIS — R51 Headache: Secondary | ICD-10-CM

## 2018-02-02 DIAGNOSIS — R519 Headache, unspecified: Secondary | ICD-10-CM | POA: Insufficient documentation

## 2018-02-02 DIAGNOSIS — R110 Nausea: Secondary | ICD-10-CM | POA: Insufficient documentation

## 2018-02-04 ENCOUNTER — Encounter: Payer: Self-pay | Admitting: Emergency Medicine

## 2018-02-04 ENCOUNTER — Ambulatory Visit
Admission: RE | Admit: 2018-02-04 | Discharge: 2018-02-04 | Disposition: A | Discharge status: Home / Self Care | Payer: 59 | Source: Ambulatory Visit | Attending: Emergency Medicine | Admitting: Emergency Medicine

## 2018-02-04 ENCOUNTER — Encounter: Payer: Self-pay | Admitting: *Deleted

## 2018-02-04 DIAGNOSIS — H538 Other visual disturbances: Secondary | ICD-10-CM | POA: Diagnosis not present

## 2018-02-04 DIAGNOSIS — G4459 Other complicated headache syndrome: Secondary | ICD-10-CM

## 2018-02-04 DIAGNOSIS — R42 Dizziness and giddiness: Secondary | ICD-10-CM | POA: Diagnosis not present

## 2018-02-04 MED ORDER — GADOBENATE DIMEGLUMINE 529 MG/ML IV SOLN
20.0000 mL | Freq: Once | INTRAVENOUS | Status: AC | PRN
  Administered 2018-02-04: 20 mL via INTRAVENOUS

## 2018-02-04 NOTE — Progress Notes (Signed)
Letter sent.

## 2018-02-11 DIAGNOSIS — L853 Xerosis cutis: Secondary | ICD-10-CM | POA: Diagnosis not present

## 2018-02-11 DIAGNOSIS — R21 Rash and other nonspecific skin eruption: Secondary | ICD-10-CM | POA: Diagnosis not present

## 2018-03-06 DIAGNOSIS — J4540 Moderate persistent asthma, uncomplicated: Secondary | ICD-10-CM | POA: Diagnosis not present

## 2018-03-06 DIAGNOSIS — L301 Dyshidrosis [pompholyx]: Secondary | ICD-10-CM | POA: Diagnosis not present

## 2018-04-13 DIAGNOSIS — R05 Cough: Secondary | ICD-10-CM | POA: Diagnosis not present

## 2018-04-13 DIAGNOSIS — J018 Other acute sinusitis: Secondary | ICD-10-CM | POA: Diagnosis not present

## 2018-04-13 DIAGNOSIS — J45909 Unspecified asthma, uncomplicated: Secondary | ICD-10-CM | POA: Diagnosis not present

## 2018-04-13 DIAGNOSIS — J029 Acute pharyngitis, unspecified: Secondary | ICD-10-CM | POA: Diagnosis not present

## 2018-04-15 ENCOUNTER — Telehealth: Payer: Self-pay | Admitting: Emergency Medicine

## 2018-04-15 NOTE — Telephone Encounter (Signed)
Called lfet message for pt on Fathers number per DOR . We are wanting to convert his OV to a telemed visit on 04/19/2018 FR

## 2018-04-19 ENCOUNTER — Other Ambulatory Visit: Payer: Self-pay

## 2018-04-19 ENCOUNTER — Encounter

## 2018-04-19 ENCOUNTER — Telehealth: Payer: 59 | Admitting: Emergency Medicine

## 2018-05-03 ENCOUNTER — Telehealth: Payer: 59 | Admitting: Emergency Medicine

## 2018-05-03 ENCOUNTER — Other Ambulatory Visit: Payer: Self-pay

## 2018-05-03 ENCOUNTER — Telehealth: Payer: Self-pay | Admitting: *Deleted

## 2018-05-03 NOTE — Telephone Encounter (Signed)
Called patient's home number [phone] recording states it is invalid. I checked patient HIPPA form and the number is the same, I dialed it 3 times and get the same recording message.

## 2018-05-24 ENCOUNTER — Other Ambulatory Visit: Payer: Self-pay

## 2018-05-24 ENCOUNTER — Encounter: Payer: Self-pay | Admitting: Emergency Medicine

## 2018-05-24 ENCOUNTER — Ambulatory Visit (INDEPENDENT_AMBULATORY_CARE_PROVIDER_SITE_OTHER): Payer: 59 | Admitting: Emergency Medicine

## 2018-05-24 VITALS — BP 119/68 | HR 77 | Temp 98.7°F | Resp 18 | Ht 70.87 in | Wt 226.4 lb

## 2018-05-24 DIAGNOSIS — G4700 Insomnia, unspecified: Secondary | ICD-10-CM | POA: Diagnosis not present

## 2018-05-24 DIAGNOSIS — J302 Other seasonal allergic rhinitis: Secondary | ICD-10-CM

## 2018-05-24 DIAGNOSIS — Z7189 Other specified counseling: Secondary | ICD-10-CM

## 2018-05-24 MED ORDER — ZOLPIDEM TARTRATE 5 MG PO TABS: 5.0000 mg | ORAL_TABLET | Freq: Every evening | ORAL | 1 refills | Status: DC | PRN

## 2018-05-24 MED ORDER — CETIRIZINE HCL 10 MG PO TABS: 10.0000 mg | ORAL_TABLET | Freq: Every day | ORAL | 11 refills | Status: DC

## 2018-05-24 MED ORDER — MONTELUKAST SODIUM 10 MG PO TABS: 10.0000 mg | ORAL_TABLET | Freq: Every day | ORAL | 3 refills | Status: DC

## 2018-05-24 MED ORDER — PREDNISONE 20 MG PO TABS: 40.0000 mg | ORAL_TABLET | Freq: Every day | ORAL | 0 refills | Status: AC

## 2018-05-24 MED ORDER — FLUTICASONE PROPIONATE 50 MCG/ACT NA SUSP: 2.0000 | Freq: Every day | NASAL | 6 refills | Status: DC

## 2018-05-24 NOTE — Patient Instructions (Addendum)
   If you have lab work done today you will be contacted with your lab results within the next 2 weeks.  If you have not heard from us then please contact us. The fastest way to get your results is to register for My Chart.   IF you received an x-ray today, you will receive an invoice from [facility]. Please contact [facility] at [phone] with questions or concerns regarding your invoice.   IF you received labwork today, you will receive an invoice from [facility]. Please contact [facility] at [phone] with questions or concerns regarding your invoice.   Our billing staff will not be able to assist you with questions regarding bills from these companies.  You will be contacted with the lab results as soon as they are available. The fastest way to get your results is to activate your My Chart account. Instructions are located on the last page of this paperwork. If you have not heard from us regarding the results in 2 weeks, please contact this office.     Allergic Rhinitis, Adult Allergic rhinitis is a reaction to allergens in the air. Allergens are tiny specks (particles) in the air that cause your body to have an allergic reaction. This condition cannot be passed from person to person (is not contagious). Allergic rhinitis cannot be cured, but it can be controlled. There are two types of allergic rhinitis:  Seasonal. This type is also called hay fever. It happens only during certain times of the year.  Perennial. This type can happen at any time of the year. What are the causes? This condition may be caused by:  Pollen from grasses, trees, and weeds.  House dust mites.  Pet dander.  Mold. What are the signs or symptoms? Symptoms of this condition include:  Sneezing.  Runny or stuffy nose (nasal congestion).  A lot of mucus in the back of the throat (postnasal drip).  Itchy nose.  Tearing of the eyes.  Trouble sleeping.  Being sleepy during  day. How is this treated? There is no cure for this condition. You should avoid things that trigger your symptoms (allergens). Treatment can help to relieve symptoms. This may include:  Medicines that block allergy symptoms, such as antihistamines. These may be given as a shot, nasal spray, or pill.  Shots that are given until your body becomes less sensitive to the allergen (desensitization).  Stronger medicines, if all other treatments have not worked. Follow these instructions at home: Avoiding allergens   Find out what you are allergic to. Common allergens include smoke, dust, and pollen.  Avoid them if you can. These are some of the things that you can do to avoid allergens: ? Replace carpet with wood, tile, or vinyl flooring. Carpet can trap dander and dust. ? Clean any mold found in the home. ? Do not smoke. Do not allow smoking in your home. ? Change your heating and air conditioning filter at least once a month. ? During allergy season:  Keep windows closed as much as you can. If possible, use air conditioning when there is a lot of pollen in the air.  Use a special filter for allergies with your furnace and air conditioner.  Plan outdoor activities when pollen counts are lowest. This is usually during the early morning or evening hours.  If you do go outdoors when pollen count is high, wear a special mask for people with allergies.  When you come indoors, take a shower and change your   clothes before sitting on furniture or bedding. General instructions  Do not use fans in your home.  Do not hang clothes outside to dry.  Wear sunglasses to keep pollen out of your eyes.  Wash your hands right away after you touch household pets.  Take over-the-counter and prescription medicines only as told by your doctor.  Keep all follow-up visits as told by your doctor. This is important. Contact a doctor if:  You have a fever.  You have a cough that does not go away (is  persistent).  You start to make whistling sounds when you breathe (wheeze).  Your symptoms do not get better with treatment.  You have thick fluid coming from your nose.  You start to have nosebleeds. Get help right away if:  Your tongue or your lips are swollen.  You have trouble breathing.  You feel dizzy or you feel like you are going to pass out (faint).  You have cold sweats. Summary  Allergic rhinitis is a reaction to allergens in the air.  This condition may be caused by allergens. These include pollen, dust mites, pet dander, and mold.  Symptoms include a runny, itchy nose, sneezing, or tearing eyes. You may also have trouble sleeping or feel sleepy during the day.  Treatment includes taking medicines and avoiding allergens. You may also get shots or take stronger medicines.  Get help if you have a fever or a cough that does not stop. Get help right away if you are short of breath. This information is not intended to replace advice given to you by your health care provider. Make sure you discuss any questions you have with your health care provider. Document Released: 05/08/2010 Document Revised: 07/28/2017 Document Reviewed: 07/28/2017 Elsevier Interactive Patient Education  2019 Elsevier Inc.  

## 2018-05-24 NOTE — Progress Notes (Signed)
Michael Lam 23 y.o.   Chief Complaint  Patient presents with  . Allergies    throat is sore and eyes get puffy and is using zytrec and flonase and has been going on for a month.   . Medication Refill    would like a refill on ambien     HISTORY OF PRESENT ILLNESS: This is a 23 y.o. male with a history of seasonal allergies complaining of intermittent symptoms for the past several weeks including bilateral eye puffiness, lip swelling, runny nose and congestion with intermittent nonproductive cough.  Denies fever or chills.  Denies exposure to coronavirus patients.  Denies nausea or vomiting.  Denies difficulty breathing or chest pain.  Has been using Zyrtec and Flonase with partial relief.  Also having intermittent sore throat.  Denies generalized body aches. Also has a history of occasional insomnia, requesting refill on Ambien which he only takes as needed.  HPI   Prior to Admission medications   Medication Sig Start Date End Date Taking? Authorizing Provider  albuterol (PROVENTIL HFA;VENTOLIN HFA) 108 (90 Base) MCG/ACT inhaler Inhale 2 puffs into the lungs every 6 (six) hours as needed for wheezing or shortness of breath. 11/24/17  Yes Denaja Verhoeven, Ines Bloomer, MD  azelastine (ASTELIN) 0.1 % nasal spray Place 2 sprays into both nostrils 2 (two) times daily. Use in each nostril as directed 11/24/17  Yes Ande Therrell, Ines Bloomer, MD  beclomethasone (QVAR REDIHALER) 40 MCG/ACT inhaler Inhale 1 puff into the lungs 2 (two) times daily. 11/24/17  Yes Anissia Wessells, Ines Bloomer, MD  blood glucose meter kit and supplies KIT Dispense based on patient and insurance preference. Use up to four times daily as directed. (FOR ICD-9 250.00, 250.01). 07/03/17  Yes Tineka Uriegas, Ines Bloomer, MD  cetirizine (ZYRTEC) 10 MG tablet Take 1 tablet (10 mg total) by mouth daily. 04/10/17  Yes Solveig Fangman, Ines Bloomer, MD  fluticasone [facility]) 50 MCG/ACT nasal spray Place 2 sprays daily into both nostrils. 11/25/16  Yes  Jaynee Eagles, PA-C  ondansetron (ZOFRAN) 4 MG tablet Take 1 tablet (4 mg total) by mouth every 8 (eight) hours as needed for nausea or vomiting. 12/28/17  Yes Rodriguez-Southworth, Sunday Spillers, PA-C  triamcinolone cream (KENALOG) 0.1 % Apply 1 application topically 2 (two) times daily. 11/24/17  Yes Calieb Lichtman, Ines Bloomer, MD  zolpidem (AMBIEN) 5 MG tablet Take 1 tablet (5 mg total) by mouth at bedtime as needed for sleep. 10/19/17 05/24/18 Yes Lauraine Rinne, NP    Allergies  Allergen Reactions  . Peanut Oil Rash  . Eggs Or Egg-Derived Products     Other reaction(s): Abdominal Pain  . Hydrocodone-Acetaminophen Rash    Rash on thigh and upper body with itching    Patient Active Problem List   Diagnosis Date Noted  . Chronic nonintractable headache 02/02/2018  . Nausea without vomiting 02/02/2018  . Right arm pain 02/02/2018  . Chronic allergic rhinitis 11/24/2017  . Shift work sleep disorder 07/06/2017  . Obstructive sleep apnea 07/06/2017  . Fatigue 07/03/2017  . Dizziness 07/03/2017  . History of hypoglycemia 07/03/2017  . Ganglion cyst of foot 06/17/2017  . History of asthma 05/19/2017  . Chronic migraine without aura without status migrainosus, not intractable 12/31/2016  . Other complicated headache syndrome 12/09/2016  . Anxiety 04/13/2015    Past Medical History:  Diagnosis Date  . Allergy   . Anxiety   . Asthma     Past Surgical History:  Procedure Laterality Date  . TONSILLECTOMY      Social  History   Socioeconomic History  . Marital status: Single    Spouse name: Not on file  . Number of children: 0  . Years of education: Not on file  . Highest education level: Associate degree: academic program  Occupational History    Employer: [employer]  . Financial resource strain: Not on file  . Food insecurity:    Worry: Not on file    Inability: Not on file  . Transportation needs:    Medical: Not on file    Non-medical: Not on file  Tobacco Use  .  Smoking status: Never Smoker  . Smokeless tobacco: Never Used  Substance and Sexual Activity  . Alcohol use: No    Alcohol/week: 0.0 standard drinks  . Drug use: No  . Sexual activity: Not Currently  Lifestyle  . Physical activity:    Days per week: Not on file    Minutes per session: Not on file  . Stress: Not on file  Relationships  . Social connections:    Talks on phone: Not on file    Gets together: Not on file    Attends religious service: Not on file    Active member of club or organization: Not on file    Attends meetings of clubs or organizations: Not on file    Relationship status: Not on file  . Intimate partner violence:    Fear of current or ex partner: Not on file    Emotionally abused: Not on file    Physically abused: Not on file    Forced sexual activity: Not on file  Other Topics Concern  . 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 Hyperlipidemia Father   . Hypertension Father   . Diabetes Father   . Diabetes Maternal Grandmother   . Migraines Maternal Grandmother   . Stroke Maternal Grandfather   . Hypertension Maternal Grandfather   . Stroke Paternal Grandfather      Review of Systems  Constitutional: Negative.  Negative for chills and fever.  HENT: Positive for congestion and sore throat. Negative for ear pain and nosebleeds.   Eyes: Negative.  Negative for blurred vision, double vision, discharge and redness.  Respiratory: Negative for cough, sputum production, shortness of breath and wheezing.   Cardiovascular: Negative.  Negative for chest pain and palpitations.  Gastrointestinal: Negative for abdominal pain, diarrhea, nausea and vomiting.  Genitourinary: Negative for dysuria.  Musculoskeletal: Negative for myalgias.  Skin: Negative.  Negative for rash.  Neurological:  Negative for dizziness and headaches.  Endo/Heme/Allergies: Negative.   All other systems reviewed and are negative.  Vitals:   05/24/18 1521  BP: 119/68  Pulse: 77  Resp: 18  Temp: 98.7 F (37.1 C)  SpO2: 94%     Physical Exam Vitals signs reviewed.  Constitutional:      Appearance: Normal appearance.  HENT:     Head: Normocephalic and atraumatic.     Nose: Nose normal.     Mouth/Throat:     Mouth: Mucous membranes are moist.     Pharynx: Oropharynx is clear. Posterior oropharyngeal erythema present. No oropharyngeal exudate.  Eyes:     Extraocular Movements: Extraocular movements intact.     Conjunctiva/sclera: Conjunctivae normal.  Pupils: Pupils are equal, round, and reactive to light.  Neck:     Musculoskeletal: Normal range of motion and neck supple. No muscular tenderness.  Cardiovascular:     Rate and Rhythm: Normal rate and regular rhythm.     Heart sounds: Normal heart sounds.  Pulmonary:     Effort: Pulmonary effort is normal.     Breath sounds: Normal breath sounds.  Musculoskeletal: Normal range of motion.  Lymphadenopathy:     Cervical: No cervical adenopathy.  Skin:    General: Skin is warm and dry.     Capillary Refill: Capillary refill takes less than 2 seconds.  Neurological:     General: No focal deficit present.     Mental Status: He is alert and oriented to person, place, and time.  Psychiatric:        Mood and Affect: Mood normal.        Behavior: Behavior normal.    A total of 25 minutes was spent in the room with the patient, greater than 50% of which was in counseling/coordination of care regarding seasonal allergies of the different approaches to treatment including different types of medications such as antihistamines and corticosteroids.  Discussed importance of nutrition and hydration as well as rest.  Discussed new medication, Singulair and side effects.  Advised to use Ambien only as needed.  Oriented in regards to the symptoms of  anaphylaxis.   ASSESSMENT & PLAN: Deyvi was seen today for allergies and medication refill.  Diagnoses and all orders for this visit:  Seasonal allergies -     cetirizine (ZYRTEC) 10 MG tablet; Take 1 tablet (10 mg total) by mouth daily for 7 days. -     fluticasone (FLONASE) 50 MCG/ACT nasal spray; Place 2 sprays into both nostrils daily. -     montelukast (SINGULAIR) 10 MG tablet; Take 1 tablet (10 mg total) by mouth at bedtime. -     predniSONE (DELTASONE) 20 MG tablet; Take 2 tablets (40 mg total) by mouth daily with breakfast for 5 days.  Insomnia, unspecified type -     Discontinue: zolpidem (AMBIEN) 5 MG tablet; Take 1 tablet (5 mg total) by mouth at bedtime as needed for sleep. -     zolpidem (AMBIEN) 5 MG tablet; Take 1 tablet (5 mg total) by mouth at bedtime as needed for sleep.  Advice Given About Covid-19 Virus Infection    Patient Instructions       If you have lab work done today you will be contacted with your lab results within the next 2 weeks.  If you have not heard from [place] then please contact us. The fastest way to get your results is to register for My Chart.   IF you received an x-ray today, you will receive an invoice from [facility]. Please contact [facility] at [phone] with questions or concerns regarding your invoice.   IF you received labwork today, you will receive an invoice from [facility]. Please contact [facility] at [phone] with questions or concerns regarding your invoice.   Our billing staff will not be able to assist you with questions regarding bills from these companies.  You will be contacted with the lab results as soon as they are available. The fastest way to get your results is to activate your My Chart account. Instructions are located on the last page of this paperwork. If you have not heard from [place] regarding the results in 2 weeks, please contact this office.  Allergic Rhinitis, Adult Allergic  rhinitis is a reaction to allergens in the air. Allergens are tiny specks (particles) in the air that cause your body to have an allergic reaction. This condition cannot be passed from person to person (is not contagious). Allergic rhinitis cannot be cured, but it can be controlled. There are two types of allergic rhinitis:  Seasonal. This type is also called hay fever. It happens only during certain times of the year.  Perennial. This type can happen at any time of the year. What are the causes? This condition may be caused by:  Pollen from grasses, trees, and weeds.  House dust mites.  Pet dander.  Mold. What are the signs or symptoms? Symptoms of this condition include:  Sneezing.  Runny or stuffy nose (nasal congestion).  A lot of mucus in the back of the throat (postnasal drip).  Itchy nose.  Tearing of the eyes.  Trouble sleeping.  Being sleepy during day. How is this treated? There is no cure for this condition. You should avoid things that trigger your symptoms (allergens). Treatment can help to relieve symptoms. This may include:  Medicines that block allergy symptoms, such as antihistamines. These may be given as a shot, nasal spray, or pill.  Shots that are given until your body becomes less sensitive to the allergen (desensitization).  Stronger medicines, if all other treatments have not worked. Follow these instructions at home: Avoiding allergens   Find out what you are allergic to. Common allergens include smoke, dust, and pollen.  Avoid them if you can. These are some of the things that you can do to avoid allergens: ? Replace carpet with wood, tile, or vinyl flooring. Carpet can trap dander and dust. ? Clean any mold found in the home. ? Do not smoke. Do not allow smoking in your home. ? Change your heating and air conditioning filter at least once a month. ? During allergy season:  Keep windows closed as much as you can. If possible, use air  conditioning when there is a lot of pollen in the air.  Use a special filter for allergies with your furnace and air conditioner.  Plan outdoor activities when pollen counts are lowest. This is usually during the early morning or evening hours.  If you do go outdoors when pollen count is high, wear a special mask for people with allergies.  When you come indoors, take a shower and change your clothes before sitting on furniture or bedding. General instructions  Do not use fans in your home.  Do not hang clothes outside to dry.  Wear sunglasses to keep pollen out of your eyes.  Wash your hands right away after you touch household pets.  Take over-the-counter and prescription medicines only as told by your doctor.  Keep all follow-up visits as told by your doctor. This is important. Contact a doctor if:  You have a fever.  You have a cough that does not go away (is persistent).  You start to make whistling sounds when you breathe (wheeze).  Your symptoms do not get better with treatment.  You have thick fluid coming from your nose.  You start to have nosebleeds. Get help right away if:  Your tongue or your lips are swollen.  You have trouble breathing.  You feel dizzy or you feel like you are going to pass out (faint).  You have cold sweats. Summary  Allergic rhinitis is a reaction to allergens in the air.  This condition may  be caused by allergens. These include pollen, dust mites, pet dander, and mold.  Symptoms include a runny, itchy nose, sneezing, or tearing eyes. You may also have trouble sleeping or feel sleepy during the day.  Treatment includes taking medicines and avoiding allergens. You may also get shots or take stronger medicines.  Get help if you have a fever or a cough that does not stop. Get help right away if you are short of breath. This information is not intended to replace advice given to you by your health care provider. Make sure you discuss  any questions you have with your health care provider. Document Released: 05/08/2010 Document Revised: 07/28/2017 Document Reviewed: 07/28/2017 Elsevier Interactive Patient Education  2019 [employer]      Agustina Caroli, MD [facility]

## 2018-07-15 ENCOUNTER — Encounter: Payer: Self-pay | Admitting: Emergency Medicine

## 2018-07-15 ENCOUNTER — Other Ambulatory Visit: Payer: Self-pay

## 2018-07-15 ENCOUNTER — Ambulatory Visit (INDEPENDENT_AMBULATORY_CARE_PROVIDER_SITE_OTHER): Payer: 59 | Admitting: Emergency Medicine

## 2018-07-15 VITALS — BP 107/70 | HR 83 | Temp 98.3°F | Resp 16 | Ht 71.0 in | Wt 223.2 lb

## 2018-07-15 DIAGNOSIS — L309 Dermatitis, unspecified: Secondary | ICD-10-CM | POA: Diagnosis not present

## 2018-07-15 DIAGNOSIS — M67431 Ganglion, right wrist: Secondary | ICD-10-CM | POA: Diagnosis not present

## 2018-07-15 DIAGNOSIS — L989 Disorder of the skin and subcutaneous tissue, unspecified: Secondary | ICD-10-CM

## 2018-07-15 MED ORDER — HALOBETASOL PROPIONATE 0.05 % EX CREA: TOPICAL_CREAM | Freq: Two times a day (BID) | CUTANEOUS | 2 refills | Status: DC

## 2018-07-15 NOTE — Progress Notes (Signed)
Michael Lam 23 y.o.   Chief Complaint  Patient presents with   Psoriasis    on both hands and feet causing pain x 2 months, worse in the last 1 week   Mass    on the back right hand x 1 week with pain    HISTORY OF PRESENT ILLNESS: This is a 23 y.o. male complaining of chronic intermittent rash to both hands for years but worse the past 2 months.  Has seen dermatologists in the past with a diagnosis of eczema, unknown cause.  No other associated symptoms.  No history of psoriasis or joint pains.  No psoriatic patches on elbows or knees. Also complaining of lump on the right wrist for several weeks.  HPI   Prior to Admission medications   Medication Sig Start Date End Date Taking? Authorizing Provider  albuterol (PROVENTIL HFA;VENTOLIN HFA) 108 (90 Base) MCG/ACT inhaler Inhale 2 puffs into the lungs every 6 (six) hours as needed for wheezing or shortness of breath. 11/24/17  Yes Juanjose Mojica, Ines Bloomer, MD  azelastine (ASTELIN) 0.1 % nasal spray Place 2 sprays into both nostrils 2 (two) times daily. Use in each nostril as directed 11/24/17  Yes Kazumi Lachney, Ines Bloomer, MD  beclomethasone (QVAR REDIHALER) 40 MCG/ACT inhaler Inhale 1 puff into the lungs 2 (two) times daily. 11/24/17  Yes Chike Farrington, Ines Bloomer, MD  fluticasone [facility]) 50 MCG/ACT nasal spray Place 2 sprays into both nostrils daily. 05/24/18  Yes Chevis Weisensel, Ines Bloomer, MD  montelukast (SINGULAIR) 10 MG tablet Take 1 tablet (10 mg total) by mouth at bedtime. 05/24/18  Yes Rolinda Impson, Ines Bloomer, MD  ondansetron (ZOFRAN) 4 MG tablet Take 1 tablet (4 mg total) by mouth every 8 (eight) hours as needed for nausea or vomiting. 12/28/17  Yes Rodriguez-Southworth, Sunday Spillers, PA-C  zolpidem (AMBIEN) 5 MG tablet Take 1 tablet (5 mg total) by mouth at bedtime as needed for sleep. 05/24/18  Yes Maximillian Habibi, Ines Bloomer, MD  blood glucose meter kit and supplies KIT Dispense based on patient and insurance preference. Use up to four times daily  as directed. (FOR ICD-9 250.00, 250.01). 07/03/17   Horald Pollen, MD  cetirizine (ZYRTEC) 10 MG tablet Take 1 tablet (10 mg total) by mouth daily for 7 days. 05/24/18 05/31/18  Horald Pollen, MD  triamcinolone cream (KENALOG) 0.1 % Apply 1 application topically 2 (two) times daily. 11/24/17   Horald Pollen, MD    Allergies  Allergen Reactions   Peanut Oil Rash   Eggs Or Egg-Derived Products     Other reaction(s): Abdominal Pain   Hydrocodone-Acetaminophen Rash    Rash on thigh and upper body with itching    Patient Active Problem List   Diagnosis Date Noted   Chronic nonintractable headache 02/02/2018   Chronic allergic rhinitis 11/24/2017   Shift work sleep disorder 07/06/2017   Obstructive sleep apnea 07/06/2017   History of hypoglycemia 07/03/2017   Ganglion cyst of foot 06/17/2017   History of asthma 05/19/2017   Chronic migraine without aura without status migrainosus, not intractable 71/69/6789   Other complicated headache syndrome 12/09/2016   Anxiety 04/13/2015    Past Medical History:  Diagnosis Date   Allergy    Anxiety    Asthma     Past Surgical History:  Procedure Laterality Date   TONSILLECTOMY      Social History   Socioeconomic History   Marital status: Single    Spouse name: Not on file   Number of children: 0  Years of education: Not on file   Highest education level: Associate degree: academic program  Occupational History    Employer: [employer]  Social Needs   Financial resource strain: Not on file   Food insecurity    Worry: Not on file    Inability: Not on file   Transportation needs    Medical: Not on file    Non-medical: Not on file  Tobacco Use   Smoking status: Never Smoker   Smokeless tobacco: Never Used  Substance and Sexual Activity   Alcohol use: No    Alcohol/week: 0.0 standard drinks   Drug use: No   Sexual activity: Not Currently  Lifestyle   Physical activity     Days per week: Not on file    Minutes per session: Not on file   Stress: Not on file  Relationships   Social connections    Talks on phone: Not on file    Gets together: Not on file    Attends religious service: Not on file    Active member of club or organization: Not on file    Attends meetings of clubs or organizations: Not on file    Relationship status: Not on file   Intimate partner violence    Fear of current or ex partner: Not on file    Emotionally abused: Not on file    Physically abused: Not on file    Forced sexual activity: Not on file  Other Topics Concern   Not on file  Social History Narrative   He lives at home alone   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  Negative for chills and fever.  HENT: Negative.  Negative for congestion and sore throat.   Eyes: Negative.  Negative for blurred vision and double vision.  Respiratory: Negative.  Negative for cough and shortness of breath.   Cardiovascular: Negative.  Negative for chest pain and palpitations.  Gastrointestinal: Negative.  Negative for abdominal pain, diarrhea, nausea and vomiting.  Genitourinary: Negative.  Negative for dysuria.  Musculoskeletal: Negative.  Negative for back pain, myalgias and neck pain.  Skin: Positive for rash (Both hands).  Neurological: Negative.  Negative for dizziness and headaches.  Endo/Heme/Allergies: Negative.   All other systems reviewed and are negative.  Vitals:   07/15/18 1058  BP: 107/70  Pulse: 83  Resp: 16  Temp: 98.3 F (36.8 C)  SpO2: 97%     Physical  Exam Vitals signs reviewed.  Constitutional:      Appearance: Normal appearance.  HENT:     Head: Normocephalic and atraumatic.  Eyes:     Extraocular Movements: Extraocular movements intact.     Pupils: Pupils are equal, round, and reactive to light.  Neck:     Musculoskeletal: Normal range of motion and neck supple.  Cardiovascular:     Rate and Rhythm: Normal rate and regular rhythm.     Heart sounds: Normal heart sounds.  Pulmonary:     Effort: Pulmonary effort is normal.  Musculoskeletal:  Normal range of motion.     Comments: Positive ganglionic cyst on the dorsum of the right wrist.  Skin:    General: Skin is warm and dry.     Capillary Refill: Capillary refill takes less than 2 seconds.     Comments: Positive eczematous changes of palmar surfaces of both hands right more than left.  No sign of infection but some areas are tender without erythema. No psoriatic patches on elbows or knees.  Neurological:     General: No focal deficit present.     Mental Status: He is alert and oriented to person, place, and time.      ASSESSMENT & PLAN: Banks was seen today for psoriasis and mass.  Diagnoses and all orders for this visit:  Eczema of both hands -     halobetasol (ULTRAVATE) 0.05 % cream; Apply topically 2 (two) times daily.  Ganglion cyst of dorsum of right wrist  Skin eruption resembling psoriasis -     Ambulatory referral to Rheumatology    Patient Instructions       If you have lab work done today you will be contacted with your lab results within the next 2 weeks.  If you have not heard from [place] then please contact us. The fastest way to get your results is to register for My Chart.   IF you received an x-ray today, you will receive an invoice from [facility]. Please contact [facility] at [phone] with questions or concerns regarding your invoice.   IF you received labwork today, you will receive an invoice from [facility]. Please  contact [facility] at [phone] with questions or concerns regarding your invoice.   Our billing staff will not be able to assist you with questions regarding bills from these companies.  You will be contacted with the lab results as soon as they are available. The fastest way to get your results is to activate your My Chart account. Instructions are located on the last page of this paperwork. If you have not heard from [place] regarding the results in 2 weeks, please contact this office.     Eczema Eczema is a broad term for a group of skin conditions that cause skin to become rough and inflamed. Each type of eczema has different triggers, symptoms, and treatments. Eczema of any type is usually itchy and symptoms range from mild to severe. Eczema and its symptoms are not spread from person to person (are not contagious). It can appear on different parts of the body at different times. Your eczema may not look the same as someone else's eczema. What are the types of eczema? Atopic dermatitis This is a long-term (chronic) skin disease that keeps coming back (recurring). Usual symptoms are dry skin and small, solid pimples that may swell and leak fluid (weep). Contact dermatitis  This happens when something irritates the skin and causes a rash. The irritation can come from substances that you are allergic to (allergens), such as poison ivy, chemicals, or medicines that were applied to your skin. Dyshidrotic eczema This is a form of eczema on the hands and feet. It shows up as very itchy, fluid-filled blisters. It can affect people of any age, but is more common before age 31. Hand eczema  This causes very itchy areas of skin on the palms and sides of the hands and fingers. This type of eczema is common in industrial jobs where you may be exposed to many different types of irritants. Lichen simplex chronicus This  type of eczema occurs when a person constantly scratches one area of the body. Repeated  scratching of the area leads to thickened skin (lichenification). Lichen simplex chronicus can occur along with other types of eczema. It is more common in adults, but may be seen in children as well. Nummular eczema This is a common type of eczema. It has no known cause. It typically causes a red, circular, crusty lesion (plaque) that may be itchy. Scratching may become a habit and can cause bleeding. Nummular eczema occurs most often in people of middle-age or older. It most often affects the hands. Seborrheic dermatitis This is a common skin disease that mainly affects the scalp. It may also affect any oily areas of the body, such as the face, sides of nose, eyebrows, ears, eyelids, and chest. It is marked by small scaling and redness of the skin (erythema). This can affect people of all ages. In infants, this condition is known as Chartered certified accountant." Stasis dermatitis This is a common skin disease that usually appears on the legs and feet. It most often occurs in people who have a condition that prevents blood from being pumped through the veins in the legs (chronic venous insufficiency). Stasis dermatitis is a chronic condition that needs long-term management. How is eczema diagnosed? Your health care provider will examine your skin and review your medical history. He or she may also give you skin patch tests. These tests involve taking patches that contain possible allergens and placing them on your back. He or she will then check in a few days to see if an allergic reaction occurred. What are the common treatments? Treatment for eczema is based on the type of eczema you have. Hydrocortisone steroid medicine can relieve itching quickly and help reduce inflammation. This medicine may be prescribed or obtained over-the-counter, depending on the strength of the medicine that is needed. Follow these instructions at home:  Take over-the-counter and prescription medicines only as told by your health care  provider.  Use creams or ointments to moisturize your skin. Do not use lotions.  Learn what triggers or irritates your symptoms. Avoid these things.  Treat symptom flare-ups quickly.  Do not itch your skin. This can make your rash worse.  Keep all follow-up visits as told by your health care provider. This is important. Where to find more information  The American Academy of Dermatology: http://jones-macias.info/  The National Eczema Association: www.nationaleczema.org Contact a health care provider if:  You have serious itching, even with treatment.  You regularly scratch your skin until it bleeds.  Your rash looks different than usual.  Your skin is painful, swollen, or more red than usual.  You have a fever. Summary  There are eight general types of eczema. Each type has different triggers.  Eczema of any type causes itching that may range from mild to severe.  Treatment varies based on the type of eczema you have. Hydrocortisone steroid medicine can help with itching and inflammation.  Protecting your skin is the best way to prevent eczema. Use moisturizers and lotions. Avoid triggers and irritants, and treat flare-ups quickly. This information is not intended to replace advice given to you by your health care provider. Make sure you discuss any questions you have with your health care provider. Document Released: 05/22/2016 Document Revised: 05/22/2016 Document Reviewed: 05/22/2016 Elsevier Interactive Patient Education  2019 Elsevier Inc.      Agustina Caroli, MD [facility]

## 2018-07-15 NOTE — Patient Instructions (Addendum)
   If you have lab work done today you will be contacted with your lab results within the next 2 weeks.  If you have not heard from us then please contact us. The fastest way to get your results is to register for My Chart.   IF you received an x-ray today, you will receive an invoice from [facility]. Please contact [facility] at [phone] with questions or concerns regarding your invoice.   IF you received labwork today, you will receive an invoice from [facility]. Please contact [facility] at [phone] with questions or concerns regarding your invoice.   Our billing staff will not be able to assist you with questions regarding bills from these companies.  You will be contacted with the lab results as soon as they are available. The fastest way to get your results is to activate your My Chart account. Instructions are located on the last page of this paperwork. If you have not heard from us regarding the results in 2 weeks, please contact this office.     Eczema Eczema is a broad term for a group of skin conditions that cause skin to become rough and inflamed. Each type of eczema has different triggers, symptoms, and treatments. Eczema of any type is usually itchy and symptoms range from mild to severe. Eczema and its symptoms are not spread from person to person (are not contagious). It can appear on different parts of the body at different times. Your eczema may not look the same as someone else's eczema. What are the types of eczema? Atopic dermatitis This is a long-term (chronic) skin disease that keeps coming back (recurring). Usual symptoms are dry skin and small, solid pimples that may swell and leak fluid (weep). Contact dermatitis  This happens when something irritates the skin and causes a rash. The irritation can come from substances that you are allergic to (allergens), such as poison ivy, chemicals, or medicines that were applied to your  skin. Dyshidrotic eczema This is a form of eczema on the hands and feet. It shows up as very itchy, fluid-filled blisters. It can affect people of any age, but is more common before age 40. Hand eczema  This causes very itchy areas of skin on the palms and sides of the hands and fingers. This type of eczema is common in industrial jobs where you may be exposed to many different types of irritants. Lichen simplex chronicus This type of eczema occurs when a person constantly scratches one area of the body. Repeated scratching of the area leads to thickened skin (lichenification). Lichen simplex chronicus can occur along with other types of eczema. It is more common in adults, but may be seen in children as well. Nummular eczema This is a common type of eczema. It has no known cause. It typically causes a red, circular, crusty lesion (plaque) that may be itchy. Scratching may become a habit and can cause bleeding. Nummular eczema occurs most often in people of middle-age or older. It most often affects the hands. Seborrheic dermatitis This is a common skin disease that mainly affects the scalp. It may also affect any oily areas of the body, such as the face, sides of nose, eyebrows, ears, eyelids, and chest. It is marked by small scaling and redness of the skin (erythema). This can affect people of all ages. In infants, this condition is known as "cradle cap." Stasis dermatitis This is a common skin disease that usually appears on the legs and feet. It   most often occurs in people who have a condition that prevents blood from being pumped through the veins in the legs (chronic venous insufficiency). Stasis dermatitis is a chronic condition that needs long-term management. How is eczema diagnosed? Your health care provider will examine your skin and review your medical history. He or she may also give you skin patch tests. These tests involve taking patches that contain possible allergens and placing them  on your back. He or she will then check in a few days to see if an allergic reaction occurred. What are the common treatments? Treatment for eczema is based on the type of eczema you have. Hydrocortisone steroid medicine can relieve itching quickly and help reduce inflammation. This medicine may be prescribed or obtained over-the-counter, depending on the strength of the medicine that is needed. Follow these instructions at home:  Take over-the-counter and prescription medicines only as told by your health care provider.  Use creams or ointments to moisturize your skin. Do not use lotions.  Learn what triggers or irritates your symptoms. Avoid these things.  Treat symptom flare-ups quickly.  Do not itch your skin. This can make your rash worse.  Keep all follow-up visits as told by your health care provider. This is important. Where to find more information  The American Academy of Dermatology: www.aad.org  The National Eczema Association: www.nationaleczema.org Contact a health care provider if:  You have serious itching, even with treatment.  You regularly scratch your skin until it bleeds.  Your rash looks different than usual.  Your skin is painful, swollen, or more red than usual.  You have a fever. Summary  There are eight general types of eczema. Each type has different triggers.  Eczema of any type causes itching that may range from mild to severe.  Treatment varies based on the type of eczema you have. Hydrocortisone steroid medicine can help with itching and inflammation.  Protecting your skin is the best way to prevent eczema. Use moisturizers and lotions. Avoid triggers and irritants, and treat flare-ups quickly. This information is not intended to replace advice given to you by your health care provider. Make sure you discuss any questions you have with your health care provider. Document Released: 05/22/2016 Document Revised: 05/22/2016 Document Reviewed:  05/22/2016 Elsevier Interactive Patient Education  2019 Elsevier Inc.  

## 2018-09-09 ENCOUNTER — Encounter: Payer: Self-pay | Admitting: Emergency Medicine

## 2018-09-09 ENCOUNTER — Other Ambulatory Visit: Payer: Self-pay | Admitting: Emergency Medicine

## 2018-09-09 DIAGNOSIS — M79641 Pain in right hand: Secondary | ICD-10-CM

## 2018-09-09 DIAGNOSIS — M79642 Pain in left hand: Secondary | ICD-10-CM

## 2018-09-09 DIAGNOSIS — L989 Disorder of the skin and subcutaneous tissue, unspecified: Secondary | ICD-10-CM

## 2018-09-09 MED ORDER — TRAMADOL HCL 50 MG PO TABS: 50.0000 mg | ORAL_TABLET | Freq: Three times a day (TID) | ORAL | 0 refills | Status: AC | PRN

## 2018-09-13 ENCOUNTER — Ambulatory Visit: Payer: BC Managed Care – PPO | Admitting: Emergency Medicine

## 2018-09-23 NOTE — Progress Notes (Signed)
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   History of Present Illness: Michael Lam is a 23 y.o. male seen in consultation per request of her his PCP.  According to patient he has had symptoms of dry and cracked hands for many years.  He states his symptoms have gotten worse in the last 2 to 3 years.  He states since September 2019 he has had more problems with cracked hands and bleeding hands.  He states that he has noticed some swelling in his hands and fingers at times to the point that he has difficulty making a fist.  He also experiences pain in his elbows and shoulders.  He describes pain in his neck, thoracic region, hips and his knee joints.  He has seen a dermatologist in [place] and he was told that he may have psoriasis or eczema and was given some topical agents which did not help.  He is a still using topical agents without help.  There is no family history of psoriasis or psoriatic arthritis.  There is no Emily history of inflammatory bowel disease.  Activities of Daily Living:  Patient reports morning stiffness for 30 minutes.   Patient Reports nocturnal pain.  Difficulty dressing/grooming: Denies Difficulty climbing stairs: Denies Difficulty getting out of chair: Denies Difficulty using hands for taps, buttons, cutlery, and/or writing: Reports  Review of Systems  Constitutional: Positive for fatigue. Negative for night sweats.  HENT: Positive for nose dryness. Negative for mouth sores, trouble swallowing, trouble swallowing and mouth dryness.   Eyes: Negative for pain, redness, itching and dryness.  Respiratory: Negative for shortness of breath, wheezing and difficulty breathing.   Cardiovascular:  Positive for palpitations. Negative for chest pain, hypertension, irregular heartbeat and swelling in legs/feet.  Gastrointestinal: Positive for blood in stool. Negative for constipation and diarrhea.  Endocrine: Negative for increased urination.  Genitourinary: Negative for difficulty urinating and painful urination.  Musculoskeletal: Positive for arthralgias, joint pain, joint swelling and morning stiffness. Negative for myalgias, muscle weakness, muscle tenderness and myalgias.  Skin: Positive for rash and redness. Negative for color change, hair loss, nodules/bumps, skin tightness, ulcers and sensitivity to sunlight.  Allergic/Immunologic: Negative for susceptible to infections.  Neurological: Positive for headaches and weakness. Negative for dizziness, fainting, memory loss and night sweats.  Hematological: Negative for bruising/bleeding tendency and swollen glands.  Psychiatric/Behavioral: Positive for sleep disturbance. Negative for depressed mood and confusion. The patient is not nervous/anxious.     PMFS History:  Patient Active Problem List   Diagnosis Date Noted  . Chronic nonintractable headache 02/02/2018  . Chronic allergic rhinitis 11/24/2017  . Shift work sleep disorder 07/06/2017  . Obstructive sleep apnea 07/06/2017  . History of hypoglycemia 07/03/2017  . Ganglion cyst of foot 06/17/2017  . History of asthma 05/19/2017  . Chronic migraine without aura without status migrainosus, not intractable 12/31/2016  . Other complicated headache syndrome 12/09/2016  . Anxiety 04/13/2015    Past Medical History:  Diagnosis Date  . Allergy   . Anxiety   . Asthma     Family History  Problem Relation Age of  Onset  . Thyroid disease Mother   . Chiari malformation Mother   . Hyperlipidemia Father   . Hypertension Father   . Diabetes Father   . Rheum arthritis Father   . Diabetes Maternal Grandmother   . Migraines Maternal Grandmother   . Rheum arthritis Maternal  Grandmother   . Stroke Maternal Grandfather   . Hypertension Maternal Grandfather   . Stroke Paternal Grandfather   . Healthy Sister   . Healthy Brother    Past Surgical History:  Procedure Laterality Date  . 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 DTaP 11/13/1994, 03/27/1995, 06/02/1995, 12/22/1995, 11/05/1998  . Hepatitis A, Ped/Adol-2 Dose 07/28/2007  . Hepatitis B, ped/adol 11/13/1994, 03/27/1995, 06/02/1995  . HiB (PRP-OMP) 11/13/1994, 03/27/1995, 06/02/1995, 12/22/1995  . IPV 11/13/1994, 03/27/1995, 06/02/1995, 11/05/1998  . Influenza, Seasonal, Injecte, Preservative Fre 11/04/2015  . Influenza,inj,Quad PF,6+ Mos 10/19/2017  . Influenza-Unspecified 09/20/2016  . MMR 08/25/1995, 11/05/1998  . Meningococcal Conjugate 07/28/2007  . Td 07/28/2007  . Tdap 07/28/2007, 11/20/2016  . Varicella 08/25/1995, 10/24/2004     Objective: Vital Signs: BP 136/86 (BP Location: Right Arm, Patient Position: Sitting, Cuff Size: Normal)   Pulse (!) 108   Resp 14   Ht '5\' 11"'$  (1.803 m)   Wt 223 lb (101.2 kg)   BMI 31.10 kg/m    Physical Exam Vitals signs and nursing note reviewed.  Constitutional:      Appearance: He is well-developed.  HENT:     Head: Normocephalic and atraumatic.  Eyes:     Conjunctiva/sclera: Conjunctivae normal.     Pupils: Pupils are equal, round, and reactive to light.  Neck:     Musculoskeletal: Normal range of motion and neck supple.  Cardiovascular:     Rate and Rhythm: Normal rate and regular rhythm.     Heart sounds: Normal heart sounds.  Pulmonary:     Effort: Pulmonary effort is normal.     Breath sounds: Normal breath sounds.  Abdominal:     General: Bowel sounds are normal.     Palpations: Abdomen is soft.  Skin:    General: Skin is warm  and dry.     Capillary Refill: Capillary refill takes less than 2 seconds.  Neurological:     Mental Status: He is alert and oriented to person, place, and time.  Psychiatric:        Behavior: Behavior normal.      Musculoskeletal Exam: C-spine thoracic spine, lumbar spine with good range of motion.  He had point tenderness over thoracic region.  He had no SI joint tenderness.  Shoulder joints, elbow joints, wrist joint, MCPs PIPs and DIPs with good range of motion.  He had discomfort with range of motion of his bilateral shoulders and bilateral wrist joints.  No tenderness was noted over MCPs or PIPs.  No synovitis was noted.  Hip joints, knee joints, ankles, MTPs and PIPs been good range of motion.  He had no evidence of plantar fasciitis or Achilles tendinitis.  CDAI Exam: CDAI Score: - Patient Global: -; Provider Global: - Swollen: -; Tender: - Joint Exam   No joint exam has been documented for this visit   There is currently no information documented on the  homunculus. Go to the Rheumatology activity and complete the homunculus joint exam.  Investigation: No additional findings.  Imaging: Xr Thoracic Spine 2 View  Result Date: 09/24/2018 No significant disc space narrowing was noted.  No syndesmophytes were noted. Impression: Unremarkable x-ray of the thoracic spine.  Xr Hand 2 View Left  Result Date: 09/24/2018 No MCP, PIP, DIP, intercarpal, radiocarpal joint space narrowing was noted.  No erosive changes were noted. Impression: Unremarkable x-ray of the hand.  Xr Hand 2 View Right  Result Date: 09/24/2018 No MCP, PIP, DIP, intercarpal, radiocarpal joint space narrowing was noted.  No erosive changes were noted. Impression: Unremarkable x-ray of the hand.   Recent Labs: Lab Results  Component Value Date   WBC 6.5 12/30/2017   HGB 13.8 12/30/2017   PLT 146 (L) 12/30/2017   NA 135 12/30/2017   K 3.9 12/30/2017   CL 106 12/30/2017   CO2 24 12/30/2017   GLUCOSE 105 (H)  12/30/2017   BUN 12 12/30/2017   CREATININE 0.93 12/30/2017   BILITOT 0.3 07/03/2017   ALKPHOS 64 07/03/2017   AST 48 (H) 07/03/2017   ALT 57 (H) 07/03/2017   PROT 6.8 07/03/2017   ALBUMIN 4.5 07/03/2017   CALCIUM 8.4 (L) 12/30/2017   GFRAA >60 12/30/2017    Speciality Comments: No specialty comments available.  Procedures:  No procedures performed Allergies: Peanut oil, Eggs or egg-derived products, and Hydrocodone-acetaminophen   Assessment / Plan:     Visit Diagnoses: Pain in both hands -patient gives history of infrequent episodes of swelling in his bilateral hands and bilateral wrist joints.  He does not have any swelling today.  He brought some pictures on his phone which were hard to assess.  He also has discomfort in his both elbows, shoulders, hips.  He complains of discomfort in cervical spine and thoracic spine.  Plan: XR Hand 2 View Right, XR Hand 2 View Left, x-ray of bilateral hands were unremarkable.  Rheumatoid factor, Sedimentation rate, MAU-Q33 antigen, Cyclic citrul peptide antibody, IgG, 14-3-3 eta Protein  Pain in thoracic spine -he had point tenderness over the lower thoracic region.  Plan: XR Thoracic Spine 2 View.  The x-ray of the thoracic spine was unremarkable.  Skin eruption resembling psoriasis-he has a dry lesions on his bilateral hands which could be consistent with psoriasis.  I will refer him to dermatology.  Patient has seen dermatologist in the past.  He is  frustrated because he did not have a definitive diagnosis.  He would benefit from getting a skin biopsy.  Ganglion cyst of foot  Family history of rheumatoid arthritis - Father and maternal grandmother, according to patient.  Shift work sleep disorder  Chronic migraine without aura without status migrainosus, not intractable  Chronic allergic rhinitis  History of hypoglycemia  History of asthma  History of anxiety  Orders: Orders Placed This Encounter  Procedures  . XR Hand 2 View  Right  . XR Hand 2 View Left  . XR Thoracic Spine 2 View  . Rheumatoid factor  . Sedimentation rate  . HLA-B27 antigen  . Cyclic citrul peptide antibody, IgG  . 14-3-3 eta Protein  . Ambulatory referral to Dermatology   No orders of the defined types were placed in this encounter.   Face-to-face time spent with patient was 45 minutes. Greater than 50% of time was spent in counseling and coordination of care.  Follow-Up Instructions: Return for rash and joint pain.   Bo Merino, MD  Note - This  record has been created using Dragon software.  Chart creation errors have been sought, but may not always  have been located. Such creation errors do not reflect on  the standard of medical care.  

## 2018-09-24 ENCOUNTER — Ambulatory Visit: Payer: Self-pay

## 2018-09-24 ENCOUNTER — Ambulatory Visit (INDEPENDENT_AMBULATORY_CARE_PROVIDER_SITE_OTHER): Payer: 59 | Admitting: Rheumatology

## 2018-09-24 ENCOUNTER — Encounter: Payer: Self-pay | Admitting: Rheumatology

## 2018-09-24 ENCOUNTER — Other Ambulatory Visit: Payer: Self-pay

## 2018-09-24 VITALS — BP 136/86 | HR 108 | Resp 14 | Ht 71.0 in | Wt 223.0 lb

## 2018-09-24 DIAGNOSIS — M79641 Pain in right hand: Secondary | ICD-10-CM

## 2018-09-24 DIAGNOSIS — M79642 Pain in left hand: Secondary | ICD-10-CM | POA: Diagnosis not present

## 2018-09-24 DIAGNOSIS — Z8261 Family history of arthritis: Secondary | ICD-10-CM

## 2018-09-24 DIAGNOSIS — Z20822 Contact with and (suspected) exposure to covid-19: Secondary | ICD-10-CM

## 2018-09-24 DIAGNOSIS — G4726 Circadian rhythm sleep disorder, shift work type: Secondary | ICD-10-CM

## 2018-09-24 DIAGNOSIS — M67479 Ganglion, unspecified ankle and foot: Secondary | ICD-10-CM | POA: Diagnosis not present

## 2018-09-24 DIAGNOSIS — G43709 Chronic migraine without aura, not intractable, without status migrainosus: Secondary | ICD-10-CM

## 2018-09-24 DIAGNOSIS — Z8659 Personal history of other mental and behavioral disorders: Secondary | ICD-10-CM

## 2018-09-24 DIAGNOSIS — M546 Pain in thoracic spine: Secondary | ICD-10-CM

## 2018-09-24 DIAGNOSIS — Z8709 Personal history of other diseases of the respiratory system: Secondary | ICD-10-CM

## 2018-09-24 DIAGNOSIS — J309 Allergic rhinitis, unspecified: Secondary | ICD-10-CM

## 2018-09-24 DIAGNOSIS — L989 Disorder of the skin and subcutaneous tissue, unspecified: Secondary | ICD-10-CM

## 2018-09-24 DIAGNOSIS — Z8639 Personal history of other endocrine, nutritional and metabolic disease: Secondary | ICD-10-CM

## 2018-09-25 LAB — NOVEL CORONAVIRUS, NAA: SARS-CoV-2, NAA: NOT DETECTED

## 2018-09-28 NOTE — Progress Notes (Signed)
I will discuss results at the follow-up visit.

## 2018-09-30 ENCOUNTER — Telehealth: Payer: Self-pay | Admitting: Rheumatology

## 2018-09-30 LAB — 14-3-3 ETA PROTEIN: 14-3-3 eta Protein: <0.2 ng/mL (ref <0.2)

## 2018-09-30 LAB — HLA-B27 ANTIGEN: HLA-B27 Antigen: NEGATIVE

## 2018-09-30 LAB — SEDIMENTATION RATE: Sed Rate: 2 mm/h (ref 0–15)

## 2018-09-30 LAB — CYCLIC CITRUL PEPTIDE ANTIBODY, IGG: Cyclic Citrullin Peptide Ab: <16 UNITS

## 2018-09-30 LAB — RHEUMATOID FACTOR: Rheumatoid fact SerPl-aCnc: <14 IU/mL (ref <14)

## 2018-09-30 NOTE — Telephone Encounter (Signed)
Called number on file and verified, as patient answered the call. Attempted to call Michael Lam back and left message on machine advising I had verified the number by calling the patient.

## 2018-09-30 NOTE — Telephone Encounter (Signed)
Monica from [facility] left a voicemail stating she was returning your call.  Brayton Layman states she tried to contact the patient, but the number that I have [phone]) is ringing busy.  Brayton Layman is requesting a return call with another contact number.  Monica's [phone] ext [phone]

## 2018-10-07 ENCOUNTER — Encounter: Payer: Self-pay | Admitting: Rheumatology

## 2018-10-07 HISTORY — PX: SKIN BIOPSY: SHX1

## 2018-10-08 ENCOUNTER — Other Ambulatory Visit: Payer: Self-pay

## 2018-10-08 ENCOUNTER — Ambulatory Visit (INDEPENDENT_AMBULATORY_CARE_PROVIDER_SITE_OTHER): Payer: 59 | Admitting: Rheumatology

## 2018-10-08 ENCOUNTER — Encounter: Payer: Self-pay | Admitting: Rheumatology

## 2018-10-08 VITALS — BP 120/80 | HR 81 | Resp 14 | Ht 71.0 in | Wt 220.6 lb

## 2018-10-08 DIAGNOSIS — M0609 Rheumatoid arthritis without rheumatoid factor, multiple sites: Secondary | ICD-10-CM

## 2018-10-08 DIAGNOSIS — Z79899 Other long term (current) drug therapy: Secondary | ICD-10-CM

## 2018-10-08 DIAGNOSIS — G4726 Circadian rhythm sleep disorder, shift work type: Secondary | ICD-10-CM

## 2018-10-08 DIAGNOSIS — M546 Pain in thoracic spine: Secondary | ICD-10-CM | POA: Diagnosis not present

## 2018-10-08 DIAGNOSIS — J309 Allergic rhinitis, unspecified: Secondary | ICD-10-CM

## 2018-10-08 DIAGNOSIS — Z8659 Personal history of other mental and behavioral disorders: Secondary | ICD-10-CM

## 2018-10-08 DIAGNOSIS — G43709 Chronic migraine without aura, not intractable, without status migrainosus: Secondary | ICD-10-CM

## 2018-10-08 DIAGNOSIS — Z8261 Family history of arthritis: Secondary | ICD-10-CM

## 2018-10-08 DIAGNOSIS — M79641 Pain in right hand: Secondary | ICD-10-CM

## 2018-10-08 DIAGNOSIS — R21 Rash and other nonspecific skin eruption: Secondary | ICD-10-CM

## 2018-10-08 DIAGNOSIS — Z8709 Personal history of other diseases of the respiratory system: Secondary | ICD-10-CM

## 2018-10-08 DIAGNOSIS — M79642 Pain in left hand: Secondary | ICD-10-CM

## 2018-10-08 DIAGNOSIS — M67479 Ganglion, unspecified ankle and foot: Secondary | ICD-10-CM

## 2018-10-08 DIAGNOSIS — Z5181 Encounter for therapeutic drug level monitoring: Secondary | ICD-10-CM | POA: Diagnosis not present

## 2018-10-08 DIAGNOSIS — Z111 Encounter for screening for respiratory tuberculosis: Secondary | ICD-10-CM | POA: Diagnosis not present

## 2018-10-08 NOTE — Progress Notes (Signed)
Office Visit Note  Patient: Michael Lam             Date of Birth: 1995/02/18           MRN: [id number]             PCP: Horald Pollen, MD Referring: Horald Pollen, * Visit Date: 10/08/2018 Occupation: _0 @  Subjective:  Pain and swelling both hands.   History of Present Illness: Michael Lam is a 23 y.o. male with history of sero negative inflammatory arthritis.  He states last Sunday he woke up with acute pain and swelling in his left wrist.  Then the swelling moved to his right wrist and bilateral hands.  He is unable to make a fist.  He has some discomfort in his feet and his knee joints but no swelling.  He was seen by dermatologist yesterday and had a skin biopsy from his right fifth finger.  Activities of Daily Living:  Patient reports morning stiffness for 15-30 minutes.   Patient Reports nocturnal pain.  Difficulty dressing/grooming: Reports Difficulty climbing stairs: Denies Difficulty getting out of chair: Denies Difficulty using hands for taps, buttons, cutlery, and/or writing: Reports  Review of Systems  Constitutional: Negative for fatigue.  HENT: Negative for mouth sores, mouth dryness and nose dryness.   Eyes: Negative for itching and dryness.  Respiratory: Negative for shortness of breath, wheezing and difficulty breathing.   Cardiovascular: Negative for chest pain and palpitations.  Gastrointestinal: Negative for blood in stool, constipation and diarrhea.  Endocrine: Negative for increased urination.  Genitourinary: Negative for difficulty urinating and painful urination.  Musculoskeletal: Positive for arthralgias, joint pain, joint swelling and morning stiffness.  Skin: Positive for rash.  Allergic/Immunologic: Negative for susceptible to infections.  Neurological: Positive for headaches. Negative for dizziness, memory loss and weakness.  Hematological: Negative for swollen glands.  Psychiatric/Behavioral:  Negative for confusion and sleep disturbance.    PMFS History:  Patient Active Problem List   Diagnosis Date Noted  . Chronic nonintractable headache 02/02/2018  . Chronic allergic rhinitis 11/24/2017  . Shift work sleep disorder 07/06/2017  . Obstructive sleep apnea 07/06/2017  . History of hypoglycemia 07/03/2017  . Ganglion cyst of foot 06/17/2017  . History of asthma 05/19/2017  . Chronic migraine without aura without status migrainosus, not intractable 12/31/2016  . Other complicated headache syndrome 12/09/2016  . Anxiety 04/13/2015    Past Medical History:  Diagnosis Date  . Allergy   . Anxiety   . Asthma     Family History  Problem Relation Age of Onset  . Thyroid disease Mother   . Chiari malformation Mother   . Hyperlipidemia Father   . Hypertension Father   . Diabetes Father   . Rheum arthritis Father   . Diabetes Maternal Grandmother   . Migraines Maternal Grandmother   . Rheum arthritis Maternal Grandmother   . Stroke Maternal Grandfather   . Hypertension Maternal Grandfather   . Stroke Paternal Grandfather   . Healthy Sister   . Healthy Brother    Past Surgical History:  Procedure Laterality Date  . SKIN BIOPSY  10/07/2018   right 5th digit.   . 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 DTaP  11/13/1994, 03/27/1995, 06/02/1995, 12/22/1995, 11/05/1998  . Hepatitis A, Ped/Adol-2 Dose 07/28/2007  . Hepatitis B, ped/adol 11/13/1994, 03/27/1995, 06/02/1995  . HiB (PRP-OMP) 11/13/1994, 03/27/1995, 06/02/1995, 12/22/1995  . IPV 11/13/1994, 03/27/1995, 06/02/1995, 11/05/1998  . Influenza, Seasonal, Injecte, Preservative Fre 11/04/2015  . Influenza,inj,Quad PF,6+ Mos 10/19/2017  . Influenza-Unspecified 09/20/2016  . MMR 08/25/1995, 11/05/1998  .  Meningococcal Conjugate 07/28/2007  . Td 07/28/2007  . Tdap 07/28/2007, 11/20/2016  . Varicella 08/25/1995, 10/24/2004     Objective: Vital Signs: BP 120/80 (BP Location: Left Arm, Patient Position: Sitting, Cuff Size: Normal)   Pulse 81   Resp 14   Ht _0  (1.803 m)   Wt 220 lb 9.6 oz (100.1 kg)   BMI 30.77 kg/m    Physical Exam Vitals signs and nursing note reviewed.  Constitutional:      Appearance: He is well-developed.  HENT:     Head: Normocephalic and atraumatic.  Eyes:     Conjunctiva/sclera: Conjunctivae normal.     Pupils: Pupils are equal, round, and reactive to light.  Neck:     Musculoskeletal: Normal range of motion and neck supple.  Cardiovascular:     Rate and Rhythm: Normal rate and regular rhythm.     Heart sounds: Normal heart sounds.  Pulmonary:     Effort: Pulmonary effort is normal.     Breath sounds: Normal breath sounds.  Abdominal:     General: Bowel sounds are normal.     Palpations: Abdomen is soft.  Skin:    General: Skin is warm and dry.     Capillary Refill: Capillary refill takes less than 2 seconds.     Comments: Dry scales were noted on his hands.  Neurological:     Mental Status: He is alert and oriented to person, place, and time.  Psychiatric:        Behavior: Behavior normal.      Musculoskeletal Exam: Spine thoracic and lumbar spine with good range of motion.  Shoulder joints elbow joints with good range of motion.  He has synovitis over bilateral wrist joints and several of his MCPs and PIPs as described below.  He is some tenderness over right ankle joint.  CDAI Exam: CDAI Score: 17.3  Patient Global: 6 mm; Provider Global: 7 mm Swollen: 8 ; Tender: 9  Joint Exam      Right  Left  Wrist  Swollen Tender  Swollen Tender  MCP 2  Swollen Tender     MCP 3  Swollen Tender     MCP 4  Swollen Tender     MCP 5  Swollen Tender     PIP 2  Swollen Tender     PIP 3  Swollen Tender     Ankle   Tender        Investigation:  No additional findings.  Imaging: Xr Thoracic Spine 2 View  Result Date: 09/24/2018 No significant disc space narrowing was noted.  No syndesmophytes were noted. Impression: Unremarkable x-ray of the thoracic spine.  Xr Hand 2 View Left  Result Date: 09/24/2018 No MCP, PIP, DIP, intercarpal, radiocarpal joint space narrowing was noted.  No erosive changes were noted. Impression: Unremarkable x-ray of the hand.  Xr Hand 2 View Right  Result Date: 09/24/2018 No MCP, PIP, DIP, intercarpal, radiocarpal joint space narrowing was noted.  No erosive changes were noted. Impression: Unremarkable x-ray of the hand.   Recent Labs: Lab Results  Component Value Date   WBC 6.5 12/30/2017   HGB  13.8 12/30/2017   PLT 146 (L) 12/30/2017   NA 135 12/30/2017   K 3.9 12/30/2017   CL 106 12/30/2017   CO2 24 12/30/2017   GLUCOSE 105 (H) 12/30/2017   BUN 12 12/30/2017   CREATININE 0.93 12/30/2017   BILITOT 0.3 07/03/2017   ALKPHOS 64 07/03/2017   AST 48 (H) 07/03/2017   ALT 57 (H) 07/03/2017   PROT 6.8 07/03/2017   ALBUMIN 4.5 07/03/2017   CALCIUM 8.4 (L) 12/30/2017   GFRAA >60 12/30/2017  HLA-B27 negative, ESR 2, RF negative, anti-CCP negative, _0 eta negative  CXR  Speciality Comments: No specialty comments available.  Procedures:  No procedures performed Allergies: Peanut oil, Eggs or egg-derived products, and Hydrocodone-acetaminophen   Assessment / Plan:     Visit Diagnoses: Rheumatoid arthritis of multiple sites with negative rheumatoid factor (HCC)-patient has severe inflammatory arthritis involving his hands and wrist.  He also has discomfort in his right knee in his feet.  Detailed counseling rheumatoid arthritis was provided.  Different treatment options and their side effects were discussed.  I will obtain some baseline labs today.  Once the labs are available we can start him on methotrexate.  Abstinent from alcohol was discussed.  As a bridging therapy I will place him on  prednisone 20 mg p.o. daily for 1 week and then taper by 5 mg every week.  Once the labs are available we will call in methotrexate 6 tablets p.o. weekly if the labs are normal in 2 weeks then we can increase the methotrexate to 8 tablets p.o. weekly.  He will need folic acid 1 mg 2 tablets p.o. daily.  After discussing indications side effects contraindications of methotrexate he wanted to proceed with methotrexate.  Consent was obtained.  Drug Counseling TB Gold: Pending Hepatitis panel: Pending  Chest-xray: December 28, 2017 normal  Contraception: Discussed  Alcohol use: Discussed  Patient was counseled on the purpose, proper use, and adverse effects of methotrexate including nausea, infection, and signs and symptoms of pneumonitis.  Reviewed instructions with patient to take methotrexate weekly along with folic acid daily.  Discussed the importance of frequent monitoring of kidney and liver function and blood counts, and provided patient with standing lab instructions.  Counseled patient to avoid NSAIDs and alcohol while on methotrexate.  Provided patient with educational materials on methotrexate and answered all questions.  Advised patient to get annual influenza vaccine and to get a pneumococcal vaccine if patient has not already had one.  Patient voiced understanding.  Patient consented to methotrexate use.  Will upload into chart.     High risk medication use - Plan: CBC with Differential/Platelet, COMPLETE METABOLIC PANEL WITH GFR, Hepatitis B core antibody, IgM, Hepatitis B surface antigen, Hepatitis C antibody, QuantiFERON-TB Gold Plus, HIV Antibody (routine testing w rflx), Serum protein electrophoresis with reflex, IgG, IgA, IgM  Pain in thoracic spine - X-rays were unremarkable.  Rash -he has dry scales on his fingers.  There was question about possible psoriasis.  He had skin biopsy yesterday.  Family history of rheumatoid arthritis - father  History of anxiety  Ganglion cyst  of foot  Shift work sleep disorder  Chronic migraine without aura without status migrainosus, not intractable  History of asthma  Chronic allergic rhinitis  Orders: Orders Placed This Encounter  Procedures  . CBC with Differential/Platelet  . COMPLETE METABOLIC PANEL WITH GFR  . Hepatitis B core antibody, IgM  . Hepatitis B surface antigen  . Hepatitis C antibody  .  QuantiFERON-TB Gold Plus  . HIV Antibody (routine testing w rflx)  . Serum protein electrophoresis with reflex  . IgG, IgA, IgM   No orders of the defined types were placed in this encounter.   Face-to-face time spent with patient was 40 minutes. Greater than 50% of time was spent in counseling and coordination of care.  Follow-Up Instructions: Return in about 4 weeks (around 11/05/2018) for Rheumatoid arthritis.   Bo Merino, MD  Note - This record has been created using Editor, commissioning.  Chart creation errors have been sought, but may not always  have been located. Such creation errors do not reflect on  the standard of medical care.

## 2018-10-08 NOTE — Patient Instructions (Signed)
Prednisone 5 mg tablet, 4 tablets p.o. daily for 1 week, 3 tablets p.o. daily for 1 week, 2 tablets p.o. daily for 1 week, 1 tablet p.o. daily for 1 week, half tablet p.o. daily for 1 week    Standing Labs We placed an order today for your standing lab work.    Please come back and get your standing labs in 2 weeks x 2 and then every 3 months after starting methotrexate  We have open lab daily Monday through Thursday from 8:30-12:30 PM and 1:30-4:30 PM and Friday from 8:30-12:30 PM and 1:30 -4:00 PM at the office of Dr. Bo Merino.   You may experience shorter wait times on Monday and Friday afternoons. The office is located at [address] [place], [place], [place] [address] No appointment is necessary.   Labs are drawn by [facility].  You may receive a bill from [facility] for your lab work.  If you wish to have your labs drawn at another location, please call the office 24 hours in advance to send orders.  If you have any questions regarding directions or hours of operation,  please call [phone].   Just as a reminder please drink plenty of water prior to coming for your lab work. Thanks!  Methotrexate tablets What is this medicine? METHOTREXATE (METH oh TREX ate) is a chemotherapy drug used to treat cancer including breast cancer, leukemia, and lymphoma. This medicine can also be used to treat psoriasis and certain kinds of arthritis. This medicine may be used for other purposes; ask your health care provider or pharmacist if you have questions. COMMON BRAND NAME(S): Rheumatrex, Trexall What should I tell my health care provider before I take this medicine? They need to know if you have any of these conditions:  fluid in the stomach area or lungs  if you often drink alcohol  infection or immune system problems  kidney disease or on hemodialysis  liver disease  low blood counts, like low white cell, platelet, or red cell counts  lung disease  radiation  therapy  stomach ulcers  ulcerative colitis  an unusual or allergic reaction to methotrexate, other medicines, foods, dyes, or preservatives  pregnant or trying to get pregnant  breast-feeding How should I use this medicine? Take this medicine by mouth with a glass of water. Follow the directions on the prescription label. Take your medicine at regular intervals. Do not take it more often than directed. Do not stop taking except on your doctor's advice. Make sure you know why you are taking this medicine and how often you should take it. If this medicine is used for a condition that is not cancer, like arthritis or psoriasis, it should be taken weekly, NOT daily. Taking this medicine more often than directed can cause serious side effects, even death. Talk to your healthcare provider about safe handling and disposal of this medicine. You may need to take special precautions. Talk to your pediatrician regarding the use of this medicine in children. While this drug may be prescribed for selected conditions, precautions do apply. Overdosage: If you think you have taken too much of this medicine contact a poison control center or emergency room at once. NOTE: This medicine is only for you. Do not share this medicine with others. What if I miss a dose? If you miss a dose, talk with your doctor or health care professional. Do not take double or extra doses. What may interact with this medicine? This medicine may interact with the  following medication:  acitretin  aspirin and aspirin-like medicines including salicylates  azathioprine  certain antibiotics like penicillins, tetracycline, and chloramphenicol  cyclosporine  gold  hydroxychloroquine  live virus vaccines  NSAIDs, medicines for pain and inflammation, like ibuprofen or naproxen  other cytotoxic agents  penicillamine  phenylbutazone  phenytoin  probenecid  retinoids such as isotretinoin and tretinoin  steroid  medicines like prednisone or cortisone  sulfonamides like sulfasalazine and trimethoprim/sulfamethoxazole  theophylline This list may not describe all possible interactions. Give your health care provider a list of all the medicines, herbs, non-prescription drugs, or dietary supplements you use. Also tell them if you smoke, drink alcohol, or use illegal drugs. Some items may interact with your medicine. What should I watch for while using this medicine? Avoid alcoholic drinks. This medicine can make you more sensitive to the sun. Keep out of the sun. If you cannot avoid being in the sun, wear protective clothing and use sunscreen. Do not use sun lamps or tanning beds/booths. You may need blood work done while you are taking this medicine. Call your doctor or health care professional for advice if you get a fever, chills or sore throat, or other symptoms of a cold or flu. Do not treat yourself. This drug decreases your body's ability to fight infections. Try to avoid being around people who are sick. This medicine may increase your risk to bruise or bleed. Call your doctor or health care professional if you notice any unusual bleeding. Check with your doctor or health care professional if you get an attack of severe diarrhea, nausea and vomiting, or if you sweat a lot. The loss of too much body fluid can make it dangerous for you to take this medicine. Talk to your doctor about your risk of cancer. You may be more at risk for certain types of cancers if you take this medicine. Both men and women must use effective birth control with this medicine. Do not become pregnant while taking this medicine or until at least 1 normal menstrual cycle has occurred after stopping it. Women should inform their doctor if they wish to become pregnant or think they might be pregnant. Men should not father a child while taking this medicine and for 3 months after stopping it. There is a potential for serious side effects  to an unborn child. Talk to your health care professional or pharmacist for more information. Do not breast-feed an infant while taking this medicine. What side effects may I notice from receiving this medicine? Side effects that you should report to your doctor or health care professional as soon as possible:  allergic reactions like skin rash, itching or hives, swelling of the face, lips, or tongue  breathing problems or shortness of breath  diarrhea  dry, nonproductive cough  low blood counts - this medicine may decrease the number of white blood cells, red blood cells and platelets. You may be at increased risk for infections and bleeding.  mouth sores  redness, blistering, peeling or loosening of the skin, including inside the mouth  signs of infection - fever or chills, cough, sore throat, pain or trouble passing urine  signs and symptoms of bleeding such as bloody or black, tarry stools; red or dark-brown urine; spitting up blood or brown material that looks like coffee grounds; red spots on the skin; unusual bruising or bleeding from the eye, gums, or nose  signs and symptoms of kidney injury like trouble passing urine or change in the amount of  urine  signs and symptoms of liver injury like dark yellow or brown urine; general ill feeling or flu-like symptoms; light-colored stools; loss of appetite; nausea; right upper belly pain; unusually weak or tired; yellowing of the eyes or skin Side effects that usually do not require medical attention (report to your doctor or health care professional if they continue or are bothersome):  dizziness  hair loss  tiredness  upset stomach  vomiting This list may not describe all possible side effects. Call your doctor for medical advice about side effects. You may report side effects to FDA at 1-800-FDA-1088. Where should I keep my medicine? Keep out of the reach of children. Store at room temperature between 20 and 25 degrees C (68  and 77 degrees F). Protect from light. Throw away any unused medicine after the expiration date. NOTE: This sheet is a summary. It may not cover all possible information. If you have questions about this medicine, talk to your doctor, pharmacist, or health care provider.  2020 Elsevier/Gold Standard (2016-08-28 13:38:43)

## 2018-10-11 LAB — COMPLETE METABOLIC PANEL WITH GFR
AG Ratio: 2.1 (calc) (ref 1.0–2.5)
ALT: 16 U/L (ref 9–46)
AST: 16 U/L (ref 10–40)
Albumin: 4.4 g/dL (ref 3.6–5.1)
Alkaline phosphatase (APISO): 36 U/L (ref 36–130)
BUN: 10 mg/dL (ref 7–25)
CO2: 26 mmol/L (ref 20–32)
Calcium: 9.1 mg/dL (ref 8.6–10.3)
Chloride: 104 mmol/L (ref 98–110)
Creat: 0.86 mg/dL (ref 0.60–1.35)
GFR, Est African American: 141 mL/min/{1.73_m2} (ref >=60)
GFR, Est Non African American: 121 mL/min/{1.73_m2} (ref >=60)
Globulin: 2.1 g/dL (calc) (ref 1.9–3.7)
Glucose, Bld: 98 mg/dL (ref 65–99)
Potassium: 3.8 mmol/L (ref 3.5–5.3)
Sodium: 137 mmol/L (ref 135–146)
Total Bilirubin: 0.7 mg/dL (ref 0.2–1.2)
Total Protein: 6.5 g/dL (ref 6.1–8.1)

## 2018-10-11 LAB — IGG, IGA, IGM
IgG (Immunoglobin G), Serum: 1001 mg/dL (ref 600–1640)
IgM, Serum: 22 mg/dL — ABNORMAL LOW (ref 50–300)
Immunoglobulin A: <5 mg/dL — ABNORMAL LOW (ref 47–310)

## 2018-10-11 LAB — CBC WITH DIFFERENTIAL/PLATELET
Absolute Monocytes: 713 cells/uL (ref 200–950)
Basophils Absolute: 29 cells/uL (ref 0–200)
Basophils Relative: 0.4 %
Eosinophils Absolute: 151 cells/uL (ref 15–500)
Eosinophils Relative: 2.1 %
HCT: 44.5 % (ref 38.5–50.0)
Hemoglobin: 15.1 g/dL (ref 13.2–17.1)
Lymphs Abs: 3089 cells/uL (ref 850–3900)
MCH: 28.3 pg (ref 27.0–33.0)
MCHC: 33.9 g/dL (ref 32.0–36.0)
MCV: 83.5 fL (ref 80.0–100.0)
MPV: 10.3 fL (ref 7.5–12.5)
Monocytes Relative: 9.9 %
Neutro Abs: 3218 cells/uL (ref 1500–7800)
Neutrophils Relative %: 44.7 %
Platelets: 218 10*3/uL (ref 140–400)
RBC: 5.33 10*6/uL (ref 4.20–5.80)
RDW: 12.8 % (ref 11.0–15.0)
Total Lymphocyte: 42.9 %
WBC: 7.2 10*3/uL (ref 3.8–10.8)

## 2018-10-11 LAB — QUANTIFERON-TB GOLD PLUS
Mitogen-NIL: 9.9 IU/mL
NIL: 0.02 IU/mL
QuantiFERON-TB Gold Plus: NEGATIVE
TB1-NIL: 0.01 IU/mL
TB2-NIL: 0 IU/mL

## 2018-10-11 LAB — PROTEIN ELECTROPHORESIS, SERUM, WITH REFLEX
Albumin ELP: 4.2 g/dL (ref 3.8–4.8)
Alpha 1: 0.2 g/dL (ref 0.2–0.3)
Alpha 2: 0.6 g/dL (ref 0.5–0.9)
Beta 2: 0.2 g/dL (ref 0.2–0.5)
Beta Globulin: 0.3 g/dL — ABNORMAL LOW (ref 0.4–0.6)
Gamma Globulin: 0.9 g/dL (ref 0.8–1.7)
Total Protein: 6.5 g/dL (ref 6.1–8.1)

## 2018-10-11 LAB — HEPATITIS C ANTIBODY
Hepatitis C Ab: NONREACTIVE
SIGNAL TO CUT-OFF: 0.02 (ref <1.00)

## 2018-10-11 LAB — HEPATITIS B CORE ANTIBODY, IGM: Hep B C IgM: NONREACTIVE

## 2018-10-11 LAB — HIV ANTIBODY (ROUTINE TESTING W REFLEX): HIV 1&2 Ab, 4th Generation: NONREACTIVE

## 2018-10-11 LAB — HEPATITIS B SURFACE ANTIGEN: Hepatitis B Surface Ag: NONREACTIVE

## 2018-10-22 NOTE — Progress Notes (Signed)
Office Visit Note  Patient: Michael Lam             Date of Birth: 1995-10-24           MRN: [id number]             PCP: Horald Pollen, MD Referring: Horald Pollen, * Visit Date: 11/05/2018 Occupation: _0 @  Subjective:  Pain in multiple joints.   History of Present Illness: Venson Ferencz is a 23 y.o. male with history of seronegative rheumatoid arthritis.  He continues to have pain and discomfort in his bilateral wrists, bilateral hands, bilateral ankles and his feet.  He also has some lower back pain.  He states the swelling is not as bad as it was at the last visit.  Says he has intermittent flares.  He also went to see dermatologist locally and was referred to [facility] dermatology.  He states he had a skin biopsy which was consistent with dermatitis and was negative for psoriasis.  Activities of Daily Living:  Patient reports morning stiffness for 30 minutes.   Patient Reports nocturnal pain.  Difficulty dressing/grooming: Denies Difficulty climbing stairs: Denies Difficulty getting out of chair: Denies Difficulty using hands for taps, buttons, cutlery, and/or writing: Reports  Review of Systems  Constitutional: Negative for fatigue and night sweats.  HENT: Negative for mouth sores, mouth dryness and nose dryness.   Eyes: Negative for redness, itching and dryness.  Respiratory: Negative for shortness of breath, wheezing and difficulty breathing.   Cardiovascular: Negative for chest pain, palpitations, hypertension, irregular heartbeat and swelling in legs/feet.  Gastrointestinal: Negative for abdominal pain, blood in stool, constipation and diarrhea.  Endocrine: Negative for increased urination.  Genitourinary: Negative for difficulty urinating and painful urination.  Musculoskeletal: Positive for arthralgias, joint pain, joint swelling and morning stiffness. Negative for myalgias, muscle weakness, muscle tenderness and myalgias.   Skin: Positive for rash. Negative for color change, hair loss, nodules/bumps, skin tightness, ulcers and sensitivity to sunlight.  Allergic/Immunologic: Negative for susceptible to infections.  Neurological: Negative for dizziness, fainting, numbness, headaches, memory loss, night sweats and weakness.  Hematological: Negative for bruising/bleeding tendency and swollen glands.  Psychiatric/Behavioral: Positive for sleep disturbance. Negative for depressed mood and confusion. The patient is nervous/anxious.     PMFS History:  Patient Active Problem List   Diagnosis Date Noted  . Chronic nonintractable headache 02/02/2018  . Chronic allergic rhinitis 11/24/2017  . Shift work sleep disorder 07/06/2017  . Obstructive sleep apnea 07/06/2017  . History of hypoglycemia 07/03/2017  . Ganglion cyst of foot 06/17/2017  . History of asthma 05/19/2017  . Chronic migraine without aura without status migrainosus, not intractable 12/31/2016  . Other complicated headache syndrome 12/09/2016  . Anxiety 04/13/2015    Past Medical History:  Diagnosis Date  . Allergy   . Anxiety   . Asthma     Family History  Problem Relation Age of Onset  . Thyroid disease Mother   . Chiari malformation Mother   . Hyperlipidemia Father   . Hypertension Father   . Diabetes Father   . Rheum arthritis Father   . Diabetes Maternal Grandmother   . Migraines Maternal Grandmother   . Rheum arthritis Maternal Grandmother   . Stroke Maternal Grandfather   . Hypertension Maternal Grandfather   . Stroke Paternal Grandfather   . Healthy Sister   . Healthy Brother    Past Surgical History:  Procedure Laterality Date  . SKIN BIOPSY  10/07/2018   right 5th digit.   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  DTaP 11/13/1994, 03/27/1995, 06/02/1995, 12/22/1995, 11/05/1998  . Hepatitis A, Ped/Adol-2 Dose 07/28/2007  . Hepatitis B, ped/adol 11/13/1994, 03/27/1995, 06/02/1995  . HiB (PRP-OMP) 11/13/1994, 03/27/1995, 06/02/1995, 12/22/1995  . IPV 11/13/1994, 03/27/1995, 06/02/1995, 11/05/1998  . Influenza, Seasonal, Injecte, Preservative Fre 11/04/2015  . Influenza,inj,Quad PF,6+ Mos 10/19/2017  . Influenza-Unspecified 09/20/2016  . MMR 08/25/1995, 11/05/1998  . Meningococcal Conjugate 07/28/2007  . Td 07/28/2007  . Tdap 07/28/2007, 11/20/2016  . Varicella 08/25/1995, 10/24/2004     Objective: Vital Signs: BP 132/78 (BP Location: Left Arm, Patient Position: Sitting, Cuff Size: Normal)   Pulse 95   Resp 14   Ht _0  (1.803 m)   Wt 220 lb 12.8 oz (100.2 kg)   BMI 30.80 kg/m    Physical Exam Vitals signs and nursing note reviewed.  Constitutional:      Appearance: He is well-developed.  HENT:     Head: Normocephalic and atraumatic.  Eyes:     Conjunctiva/sclera: Conjunctivae normal.     Pupils: Pupils are equal, round, and reactive to light.  Neck:     Musculoskeletal: Normal range of motion and neck supple.  Cardiovascular:     Rate and Rhythm: Normal rate and regular rhythm.     Heart sounds: Normal heart sounds.  Pulmonary:     Effort: Pulmonary effort is normal.     Breath sounds: Normal breath sounds.  Abdominal:     General: Bowel sounds are normal.     Palpations: Abdomen is soft.  Skin:    General: Skin is warm and dry.     Capillary Refill: Capillary refill takes less than 2 seconds.  Neurological:     Mental Status: He is alert and oriented to person, place, and time.  Psychiatric:        Behavior: Behavior normal.      Musculoskeletal Exam: Good range of motion of his cervical spine.  Shoulder joints elbow joints wrist joint MCPs PIPs DIPs been good range of motion.  He had tenderness on palpation of bilateral MCPs and wrist joints.  No synovitis was noted.  He  had discomfort on palpation over bilateral ankle joints.  All the joints were in good range of motion with no synovitis.  CDAI Exam: CDAI Score: 6  Patient Global: 5 mm; Provider Global: 5 mm Swollen: 0 ; Tender: 6  Joint Exam      Right  Left  Wrist   Tender   Tender  MCP 2   Tender   Tender  MCP 3   Tender     Ankle   Tender        Investigation: No additional findings.  Imaging: No results found.  Recent Labs: Lab Results  Component Value Date   WBC 7.2 10/08/2018   HGB 15.1 10/08/2018   PLT 218 10/08/2018   NA 137 10/08/2018   K 3.8 10/08/2018   CL 104 10/08/2018   CO2 26 10/08/2018   GLUCOSE 98 10/08/2018   BUN 10 10/08/2018   CREATININE 0.86 10/08/2018   BILITOT 0.7 10/08/2018   ALKPHOS 64 07/03/2017   AST 16 10/08/2018   ALT 16 10/08/2018   PROT 6.5 10/08/2018  PROT 6.5 10/08/2018   ALBUMIN 4.5 07/03/2017   CALCIUM 9.1 10/08/2018   GFRAA 141 10/08/2018   QFTBGOLDPLUS NEGATIVE 10/08/2018   October 08, 2018 immunoglobulin normal, SPEP unremarkable, HIV negative, hepatitis B-, hepatitis C negative, TB Gold negative  September 24, 2018 sed rate normal, RF negative, anti-CCP negative, HLA-B27 negative Speciality Comments: No specialty comments available.  Procedures:  No procedures performed Allergies: Peanut oil, Eggs or egg-derived products, and Hydrocodone-acetaminophen   Assessment / Plan:     Visit Diagnoses: Rheumatoid arthritis of multiple sites with negative rheumatoid factor ([place]) - patient has severe inflammatory arthritis involving his hands and wrist.  Patient has been having intermittent flares of arthritis.  At the last visit he had severe synovitis in his joints.  Today he did not have much synovitis but had tenderness.  We had detailed discussion regarding use of methotrexate at the last visit.  Indications side effects contraindications were again reviewed.  Handout was given.  Consent was taken at the last visit.  The plan is to start him  on methotrexate 6 tablets p.o. weekly along with folic acid 2 mg p.o. daily.  If his labs are normal at 2 weeks then we will increase it to 8 tablets p.o. weekly.  We will check labs again in 2 weeks and then every 2 months to monitor for drug toxicity.  High risk medication use -instructions on lab work was given.  Rash - he has dry scales on his fingers.  Patient states that he had a skin biopsy at [facility] and it was consistent with eczema or dermatitis.  It was negative for psoriasis.  Family history of rheumatoid arthritis - father, MGM  Ganglion cyst of foot  History of anxiety  Chronic migraine without aura without status migrainosus, not intractable  Chronic allergic rhinitis  Shift work sleep disorder  History of asthma  Orders: No orders of the defined types were placed in this encounter.  No orders of the defined types were placed in this encounter.   Face-to-face time spent with patient was 25 minutes. Greater than 50% of time was spent in counseling and coordination of care.  Follow-Up Instructions: Return in about 4 weeks (around 12/03/2018) for Rheumatoid arthritis.   Bo Merino, MD  Note - This record has been created using Editor, commissioning.  Chart creation errors have been sought, but may not always  have been located. Such creation errors do not reflect on  the standard of medical care.

## 2018-10-27 ENCOUNTER — Ambulatory Visit: Payer: BC Managed Care – PPO | Admitting: Rheumatology

## 2018-11-05 ENCOUNTER — Encounter: Payer: Self-pay | Admitting: Rheumatology

## 2018-11-05 ENCOUNTER — Other Ambulatory Visit: Payer: Self-pay

## 2018-11-05 ENCOUNTER — Ambulatory Visit (INDEPENDENT_AMBULATORY_CARE_PROVIDER_SITE_OTHER): Payer: 59 | Admitting: Rheumatology

## 2018-11-05 VITALS — BP 132/78 | HR 95 | Resp 14 | Ht 71.0 in | Wt 220.8 lb

## 2018-11-05 DIAGNOSIS — Z8261 Family history of arthritis: Secondary | ICD-10-CM

## 2018-11-05 DIAGNOSIS — M0609 Rheumatoid arthritis without rheumatoid factor, multiple sites: Secondary | ICD-10-CM

## 2018-11-05 DIAGNOSIS — G43709 Chronic migraine without aura, not intractable, without status migrainosus: Secondary | ICD-10-CM

## 2018-11-05 DIAGNOSIS — Z8709 Personal history of other diseases of the respiratory system: Secondary | ICD-10-CM

## 2018-11-05 DIAGNOSIS — M67479 Ganglion, unspecified ankle and foot: Secondary | ICD-10-CM

## 2018-11-05 DIAGNOSIS — Z79899 Other long term (current) drug therapy: Secondary | ICD-10-CM

## 2018-11-05 DIAGNOSIS — R21 Rash and other nonspecific skin eruption: Secondary | ICD-10-CM

## 2018-11-05 DIAGNOSIS — G4726 Circadian rhythm sleep disorder, shift work type: Secondary | ICD-10-CM

## 2018-11-05 DIAGNOSIS — Z8659 Personal history of other mental and behavioral disorders: Secondary | ICD-10-CM

## 2018-11-05 DIAGNOSIS — J309 Allergic rhinitis, unspecified: Secondary | ICD-10-CM

## 2018-11-05 MED ORDER — FOLIC ACID 1 MG PO TABS: 2.0000 mg | ORAL_TABLET | Freq: Every day | ORAL | 2 refills | Status: DC

## 2018-11-05 MED ORDER — METHOTREXATE 2.5 MG PO TABS: ORAL_TABLET | ORAL | 0 refills | Status: DC

## 2018-11-05 NOTE — Addendum Note (Signed)
Addended by: Earnestine Mealing on: 11/05/2018 02:34 PM   Modules accepted: Orders

## 2018-11-05 NOTE — Patient Instructions (Addendum)
Standing Labs We placed an order today for your standing lab work.    Please come back and get your standing labs in 2 weeks x 2 and then 2 months.  We have open lab daily Monday through Thursday from 8:30-12:30 PM and 1:30-4:30 PM and Friday from 8:30-12:30 PM and 1:30-4:00 PM at the office of Dr. Bo Merino.   You may experience shorter wait times on Monday and Friday afternoons. The office is located at [address] [place], [place], [place] [address] No appointment is necessary.   Labs are drawn by [facility].  You may receive a bill from [facility] for your lab work.  If you wish to have your labs drawn at another location, please call the office 24 hours in advance to send orders.  If you have any questions regarding directions or hours of operation,  please call [phone].   Just as a reminder please drink plenty of water prior to coming for your lab work. Thanks!    Methotrexate tablets What is this medicine? METHOTREXATE (METH oh TREX ate) is a chemotherapy drug used to treat cancer including breast cancer, leukemia, and lymphoma. This medicine can also be used to treat psoriasis and certain kinds of arthritis. This medicine may be used for other purposes; ask your health care provider or pharmacist if you have questions. COMMON BRAND NAME(S): Rheumatrex, Trexall What should I tell my health care provider before I take this medicine? They need to know if you have any of these conditions:  fluid in the stomach area or lungs  if you often drink alcohol  infection or immune system problems  kidney disease or on hemodialysis  liver disease  low blood counts, like low white cell, platelet, or red cell counts  lung disease  radiation therapy  stomach ulcers  ulcerative colitis  an unusual or allergic reaction to methotrexate, other medicines, foods, dyes, or preservatives  pregnant or trying to get pregnant  breast-feeding How should I use this  medicine? Take this medicine by mouth with a glass of water. Follow the directions on the prescription label. Take your medicine at regular intervals. Do not take it more often than directed. Do not stop taking except on your doctor's advice. Make sure you know why you are taking this medicine and how often you should take it. If this medicine is used for a condition that is not cancer, like arthritis or psoriasis, it should be taken weekly, NOT daily. Taking this medicine more often than directed can cause serious side effects, even death. Talk to your healthcare provider about safe handling and disposal of this medicine. You may need to take special precautions. Talk to your pediatrician regarding the use of this medicine in children. While this drug may be prescribed for selected conditions, precautions do apply. Overdosage: If you think you have taken too much of this medicine contact a poison control center or emergency room at once. NOTE: This medicine is only for you. Do not share this medicine with others. What if I miss a dose? If you miss a dose, talk with your doctor or health care professional. Do not take double or extra doses. What may interact with this medicine? This medicine may interact with the following medication:  acitretin  aspirin and aspirin-like medicines including salicylates  azathioprine  certain antibiotics like penicillins, tetracycline, and chloramphenicol  cyclosporine  gold  hydroxychloroquine  live virus vaccines  NSAIDs, medicines for pain and inflammation, like ibuprofen or naproxen  other cytotoxic  agents  penicillamine  phenylbutazone  phenytoin  probenecid  retinoids such as isotretinoin and tretinoin  steroid medicines like prednisone or cortisone  sulfonamides like sulfasalazine and trimethoprim/sulfamethoxazole  theophylline This list may not describe all possible interactions. Give your health care provider a list of all the  medicines, herbs, non-prescription drugs, or dietary supplements you use. Also tell them if you smoke, drink alcohol, or use illegal drugs. Some items may interact with your medicine. What should I watch for while using this medicine? Avoid alcoholic drinks. This medicine can make you more sensitive to the sun. Keep out of the sun. If you cannot avoid being in the sun, wear protective clothing and use sunscreen. Do not use sun lamps or tanning beds/booths. You may need blood work done while you are taking this medicine. Call your doctor or health care professional for advice if you get a fever, chills or sore throat, or other symptoms of a cold or flu. Do not treat yourself. This drug decreases your body's ability to fight infections. Try to avoid being around people who are sick. This medicine may increase your risk to bruise or bleed. Call your doctor or health care professional if you notice any unusual bleeding. Check with your doctor or health care professional if you get an attack of severe diarrhea, nausea and vomiting, or if you sweat a lot. The loss of too much body fluid can make it dangerous for you to take this medicine. Talk to your doctor about your risk of cancer. You may be more at risk for certain types of cancers if you take this medicine. Both men and women must use effective birth control with this medicine. Do not become pregnant while taking this medicine or until at least 1 normal menstrual cycle has occurred after stopping it. Women should inform their doctor if they wish to become pregnant or think they might be pregnant. Men should not father a child while taking this medicine and for 3 months after stopping it. There is a potential for serious side effects to an unborn child. Talk to your health care professional or pharmacist for more information. Do not breast-feed an infant while taking this medicine. What side effects may I notice from receiving this medicine? Side effects  that you should report to your doctor or health care professional as soon as possible:  allergic reactions like skin rash, itching or hives, swelling of the face, lips, or tongue  breathing problems or shortness of breath  diarrhea  dry, nonproductive cough  low blood counts - this medicine may decrease the number of white blood cells, red blood cells and platelets. You may be at increased risk for infections and bleeding.  mouth sores  redness, blistering, peeling or loosening of the skin, including inside the mouth  signs of infection - fever or chills, cough, sore throat, pain or trouble passing urine  signs and symptoms of bleeding such as bloody or black, tarry stools; red or dark-brown urine; spitting up blood or brown material that looks like coffee grounds; red spots on the skin; unusual bruising or bleeding from the eye, gums, or nose  signs and symptoms of kidney injury like trouble passing urine or change in the amount of urine  signs and symptoms of liver injury like dark yellow or brown urine; general ill feeling or flu-like symptoms; light-colored stools; loss of appetite; nausea; right upper belly pain; unusually weak or tired; yellowing of the eyes or skin Side effects that usually do  not require medical attention (report to your doctor or health care professional if they continue or are bothersome):  dizziness  hair loss  tiredness  upset stomach  vomiting This list may not describe all possible side effects. Call your doctor for medical advice about side effects. You may report side effects to FDA at 1-800-FDA-1088. Where should I keep my medicine? Keep out of the reach of children. Store at room temperature between 20 and 25 degrees C (68 and 77 degrees F). Protect from light. Throw away any unused medicine after the expiration date. NOTE: This sheet is a summary. It may not cover all possible information. If you have questions about this medicine, talk to  your doctor, pharmacist, or health care provider.  2020 Elsevier/Gold Standard (2016-08-28 13:38:43)

## 2018-11-19 NOTE — Progress Notes (Deleted)
Office Visit Note  Patient: Michael Lam             Date of Birth: 10-23-1995           MRN: [id number]             PCP: Horald Pollen, MD Referring: Horald Pollen, * Visit Date: 12/03/2018 Occupation: '@GUAROCC' @  Subjective:  No chief complaint on file.   History of Present Illness: Michael Lam is a 23 y.o. male ***   Activities of Daily Living:  Patient reports morning stiffness for *** {minute/hour:19697}.   Patient {ACTIONS;DENIES/REPORTS:21021675::"Denies"} nocturnal pain.  Difficulty dressing/grooming: {ACTIONS;DENIES/REPORTS:21021675::"Denies"} Difficulty climbing stairs: {ACTIONS;DENIES/REPORTS:21021675::"Denies"} Difficulty getting out of chair: {ACTIONS;DENIES/REPORTS:21021675::"Denies"} Difficulty using hands for taps, buttons, cutlery, and/or writing: {ACTIONS;DENIES/REPORTS:21021675::"Denies"}  No Rheumatology ROS completed.   PMFS History:  Patient Active Problem List   Diagnosis Date Noted  . Chronic nonintractable headache 02/02/2018  . Chronic allergic rhinitis 11/24/2017  . Shift work sleep disorder 07/06/2017  . Obstructive sleep apnea 07/06/2017  . History of hypoglycemia 07/03/2017  . Ganglion cyst of foot 06/17/2017  . History of asthma 05/19/2017  . Chronic migraine without aura without status migrainosus, not intractable 12/31/2016  . Other complicated headache syndrome 12/09/2016  . Anxiety 04/13/2015    Past Medical History:  Diagnosis Date  . Allergy   . Anxiety   . Asthma     Family History  Problem Relation Age of Onset  . Thyroid disease Mother   . Chiari malformation Mother   . Hyperlipidemia Father   . Hypertension Father   . Diabetes Father   . Rheum arthritis Father   . Diabetes Maternal Grandmother   . Migraines Maternal Grandmother   . Rheum arthritis Maternal Grandmother   . Stroke Maternal Grandfather   . Hypertension Maternal Grandfather   . Stroke Paternal Grandfather   .  Healthy Sister   . Healthy Brother    Past Surgical History:  Procedure Laterality Date  . SKIN BIOPSY  10/07/2018   right 5th digit.   . 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 DTaP 11/13/1994, 03/27/1995, 06/02/1995, 12/22/1995, 11/05/1998  . Hepatitis A, Ped/Adol-2 Dose 07/28/2007  . Hepatitis B, ped/adol 11/13/1994, 03/27/1995, 06/02/1995  . HiB (PRP-OMP) 11/13/1994, 03/27/1995, 06/02/1995, 12/22/1995  . IPV 11/13/1994, 03/27/1995, 06/02/1995, 11/05/1998  . Influenza, Seasonal, Injecte, Preservative Fre 11/04/2015  . Influenza,inj,Quad PF,6+ Mos 10/19/2017  . Influenza-Unspecified 09/20/2016  . MMR 08/25/1995, 11/05/1998  . Meningococcal Conjugate 07/28/2007  . Td 07/28/2007  . Tdap 07/28/2007, 11/20/2016  . Varicella 08/25/1995, 10/24/2004     Objective: Vital Signs: There were no vitals taken for this visit.   Physical Exam   Musculoskeletal Exam: ***  CDAI Exam: CDAI Score: - Patient Global: -; Provider Global: - Swollen: -; Tender: - Joint Exam   No joint exam has been documented for this visit   There is currently no information documented on the homunculus. Go to the Rheumatology activity and complete the homunculus joint exam.  Investigation: No additional findings.  Imaging: No results found.  Recent Labs: Lab Results  Component Value Date   WBC 7.2 10/08/2018   HGB 15.1 10/08/2018   PLT 218 10/08/2018   NA 137 10/08/2018  K 3.8 10/08/2018   CL 104 10/08/2018   CO2 26 10/08/2018   GLUCOSE 98 10/08/2018   BUN 10 10/08/2018   CREATININE 0.86 10/08/2018   BILITOT 0.7 10/08/2018   ALKPHOS 64 07/03/2017   AST 16 10/08/2018   ALT 16 10/08/2018   PROT 6.5 10/08/2018   PROT 6.5 10/08/2018   ALBUMIN 4.5 07/03/2017   CALCIUM 9.1  10/08/2018   GFRAA 141 10/08/2018   QFTBGOLDPLUS NEGATIVE 10/08/2018    Speciality Comments: No specialty comments available.  Procedures:  No procedures performed Allergies: Peanut oil, Eggs or egg-derived products, and Hydrocodone-acetaminophen   Assessment / Plan:     Visit Diagnoses: Rheumatoid arthritis of multiple sites with negative rheumatoid factor ([place])  High risk medication use - Started on MTX at last visit   Ganglion cyst of foot  Rash  Family history of rheumatoid arthritis - Father, MGM  History of anxiety  Chronic migraine without aura without status migrainosus, not intractable  Chronic allergic rhinitis  Shift work sleep disorder  History of asthma  Orders: No orders of the defined types were placed in this encounter.  No orders of the defined types were placed in this encounter.   Face-to-face time spent with patient was *** minutes. Greater than 50% of time was spent in counseling and coordination of care.  Follow-Up Instructions: No follow-ups on file.   Ofilia Neas, PA-C  Note - This record has been created using Dragon software.  Chart creation errors have been sought, but may not always  have been located. Such creation errors do not reflect on  the standard of medical care.

## 2018-11-22 ENCOUNTER — Encounter: Payer: Self-pay | Admitting: Rheumatology

## 2018-11-22 NOTE — Progress Notes (Signed)
Office Visit Note  Patient: Michael Lam             Date of Birth: 01-Jan-1996           MRN: [id number]             PCP: Horald Pollen, MD Referring: Horald Pollen, * Visit Date: 11/23/2018 Occupation: '@GUAROCC' @  Subjective:  Rash.   History of Present Illness: Michael Lam is a 23 y.o. male with history of seronegative rheumatoid arthritis.  He was started on methotrexate after the last visit.  He states after taking the first dose of methotrexate about 2 days later he started having rash all over his trunk extremities and face.  It was very pruritic.  He states the rash is gradually resolving.  He is not taking any Benadryl.  He has not had any recent episode of joint swelling.  He has episodic inflammation and swelling in his joints.  Activities of Daily Living:  Patient reports morning stiffness for 30-60- minutes.   Patient Reports nocturnal pain.  Difficulty dressing/grooming: Denies Difficulty climbing stairs: Denies Difficulty getting out of chair: Denies Difficulty using hands for taps, buttons, cutlery, and/or writing: Denies  Review of Systems  Constitutional: Negative for fatigue and night sweats.  HENT: Negative for mouth sores, mouth dryness and nose dryness.   Eyes: Negative for redness and dryness.  Respiratory: Negative for shortness of breath and difficulty breathing.   Cardiovascular: Negative for chest pain, palpitations, hypertension, irregular heartbeat and swelling in legs/feet.  Gastrointestinal: Negative for constipation and diarrhea.  Endocrine: Negative for increased urination.  Musculoskeletal: Positive for arthralgias, joint pain, joint swelling and morning stiffness. Negative for myalgias, muscle weakness, muscle tenderness and myalgias.  Skin: Negative for color change, rash, hair loss, nodules/bumps, skin tightness, ulcers and sensitivity to sunlight.  Allergic/Immunologic: Negative for susceptible to  infections.  Neurological: Negative for dizziness, fainting, memory loss, night sweats and weakness ( ).  Hematological: Negative for swollen glands.  Psychiatric/Behavioral: Negative for depressed mood and sleep disturbance. The patient is nervous/anxious.     PMFS History:  Patient Active Problem List   Diagnosis Date Noted  . Rheumatoid arthritis of multiple sites with negative rheumatoid factor ([place]) 11/23/2018  . Family history of rheumatoid arthritis 11/23/2018  . Chronic nonintractable headache 02/02/2018  . Chronic allergic rhinitis 11/24/2017  . Shift work sleep disorder 07/06/2017  . Obstructive sleep apnea 07/06/2017  . History of hypoglycemia 07/03/2017  . Ganglion cyst of foot 06/17/2017  . History of asthma 05/19/2017  . Chronic migraine without aura without status migrainosus, not intractable 12/31/2016  . Other complicated headache syndrome 12/09/2016  . Anxiety 04/13/2015    Past Medical History:  Diagnosis Date  . Allergy   . Anxiety   . Asthma     Family History  Problem Relation Age of Onset  . Thyroid disease Mother   . Chiari malformation Mother   . Hyperlipidemia Father   . Hypertension Father   . Diabetes Father   . Rheum arthritis Father   . Diabetes Maternal Grandmother   . Migraines Maternal Grandmother   . Rheum arthritis Maternal Grandmother   . Stroke Maternal Grandfather   . Hypertension Maternal Grandfather   . Stroke Paternal Grandfather   . Healthy Sister   . Healthy Brother    Past Surgical History:  Procedure Laterality Date  . SKIN BIOPSY  10/07/2018   right 5th digit.   . 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 DTaP 11/13/1994, 03/27/1995, 06/02/1995, 12/22/1995, 11/05/1998  . Hepatitis A, Ped/Adol-2 Dose 07/28/2007   . Hepatitis B, ped/adol 11/13/1994, 03/27/1995, 06/02/1995  . HiB (PRP-OMP) 11/13/1994, 03/27/1995, 06/02/1995, 12/22/1995  . IPV 11/13/1994, 03/27/1995, 06/02/1995, 11/05/1998  . Influenza, Seasonal, Injecte, Preservative Fre 11/04/2015  . Influenza,inj,Quad PF,6+ Mos 10/19/2017  . Influenza-Unspecified 09/20/2016  . MMR 08/25/1995, 11/05/1998  . Meningococcal Conjugate 07/28/2007  . Td 07/28/2007  . Tdap 07/28/2007, 11/20/2016  . Varicella 08/25/1995, 10/24/2004     Objective: Vital Signs: BP 138/80 (BP Location: Left Arm, Patient Position: Sitting, Cuff Size: Small)   Pulse 92   Resp 12   Ht '5\' 11"'  (1.803 m)   Wt 222 lb 9.6 oz (101 kg)   BMI 31.05 kg/m    Physical Exam Vitals signs and nursing note reviewed.  Constitutional:      Appearance: He is well-developed.  HENT:     Head: Normocephalic and atraumatic.  Eyes:     Conjunctiva/sclera: Conjunctivae normal.     Pupils: Pupils are equal, round, and reactive to light.  Neck:     Musculoskeletal: Normal range of motion and neck supple.  Cardiovascular:     Rate and Rhythm: Normal rate and regular rhythm.     Heart sounds: Normal heart sounds.  Pulmonary:     Effort: Pulmonary effort is normal.     Breath sounds: Normal breath sounds.  Abdominal:     General: Bowel sounds are normal.     Palpations: Abdomen is soft.  Skin:    General: Skin is warm and dry.     Capillary Refill: Capillary refill takes less than 2 seconds.  Neurological:     Mental Status: He is alert and oriented to person, place, and time.  Psychiatric:        Behavior: Behavior normal.      Musculoskeletal Exam: C-spine, shoulder joints, elbow joints, wrist joint, MCC and PIPs were in good range of motion with no synovitis.  Hip joints, knee joints, ankles MTPs PIPs were DIPs in good range of motion with no synovitis.  CDAI Exam: CDAI Score: - Patient Global: -; Provider Global: - Swollen: -; Tender: - Joint Exam   No joint exam has  been documented for this visit   There is currently no information documented on the homunculus. Go to the Rheumatology activity and complete the homunculus joint exam.  Investigation: No additional findings.  Imaging: No results found.  Recent Labs: Lab Results  Component Value Date   WBC 7.2 10/08/2018   HGB 15.1 10/08/2018   PLT 218 10/08/2018   NA 137 10/08/2018   K 3.8 10/08/2018   CL 104 10/08/2018   CO2 26 10/08/2018   GLUCOSE 98 10/08/2018   BUN 10 10/08/2018   CREATININE 0.86 10/08/2018   BILITOT 0.7 10/08/2018   ALKPHOS 64 07/03/2017   AST 16 10/08/2018   ALT 16 10/08/2018   PROT 6.5 10/08/2018   PROT 6.5 10/08/2018   ALBUMIN 4.5 07/03/2017   CALCIUM 9.1 10/08/2018   GFRAA 141 10/08/2018   QFTBGOLDPLUS NEGATIVE 10/08/2018    Speciality Comments: No specialty comments available.  Procedures:  No procedures performed Allergies: Methotrexate derivatives, Peanut oil, Eggs or egg-derived products, and Hydrocodone-acetaminophen  Assessment / Plan:     Visit Diagnoses: Rheumatoid arthritis of multiple sites with negative rheumatoid factor (H[facility])-patient had no synovitis on examination.  He has episodic severe inflammatory arthritis.  He tried methotrexate for 1 week but discontinued due to the rash.  It was most likely his allergic reaction to methotrexate.  The rash is resolving gradually.  He did not have to take any antihistamines.  We had detailed discussion regarding different treatment options and their side effects.  He wants to try Plaquenil at this time.  Indications side effects contraindications were discussed.  I also made him aware that Plaquenil can cause photosensitivity and rash sometimes.  He will need yearly eye examination and baseline eye examination.  We will check labs in a month and then every 3 months to monitor for drug toxicity.  Pain in both hands -he is concerned about celiac disease as well.  I will check following labs today.  Plan: Tissue  transglutaminase, IgA, Gliadin antibodies, serum  High risk medication use - started on MTX at last visit which was discontinued after the rash.  He will be starting Plaquenil today.  Ganglion cyst of foot  Family history of rheumatoid arthritis  History of anxiety  Chronic migraine without aura without status migrainosus, not intractable  History of asthma  Chronic allergic rhinitis  Shift work sleep disorder  Orders: Orders Placed This Encounter  Procedures  . Tissue transglutaminase, IgA  . Gliadin antibodies, serum   Meds ordered this encounter  Medications  . hydroxychloroquine (PLAQUENIL) 200 MG tablet    Sig: Take 1 tablet (200 mg total) by mouth 2 (two) times daily.    Dispense:  180 tablet    Refill:  0      Follow-Up Instructions: Return in about 4 weeks (around 12/21/2018) for Rheumatoid arthritis and Plaquenil NSFU.   Bo Merino, MD  Note - This record has been created using Editor, commissioning.  Chart creation errors have been sought, but may not always  have been located. Such creation errors do not reflect on  the standard of medical care.

## 2018-11-22 NOTE — Telephone Encounter (Signed)
Called patient to discuss symptoms.  He reports he has itchy red bumps or hives all over his body including face and abdomen.  He states they are itchy and painful. He has taken Tylenol which offered no relief.  Denies fever, shortness of breath, chest tightness, throat tightness.  He has taken only one dose of methotrexate.    Advised patient to discontinue methotrexate. Instructed patient to take benadryl.  Instructed patient to seek urgent medical care if his symptoms worsen or he develops shortness of breath, chest tightness, throat tightness.  Patient verbalized understanding.  Asked patient to take picture of rash and transferred to front desk to schedule a follow up appointment this week.  Mariella Saa, PharmD, [place], [place] Clinical Specialty Pharmacist [phone]  11/22/2018 1:14 PM

## 2018-11-23 ENCOUNTER — Encounter: Payer: Self-pay | Admitting: Rheumatology

## 2018-11-23 ENCOUNTER — Ambulatory Visit (INDEPENDENT_AMBULATORY_CARE_PROVIDER_SITE_OTHER): Payer: 59 | Admitting: Rheumatology

## 2018-11-23 ENCOUNTER — Other Ambulatory Visit: Payer: Self-pay

## 2018-11-23 VITALS — BP 138/80 | HR 92 | Resp 12 | Ht 71.0 in | Wt 222.6 lb

## 2018-11-23 DIAGNOSIS — Z79899 Other long term (current) drug therapy: Secondary | ICD-10-CM | POA: Diagnosis not present

## 2018-11-23 DIAGNOSIS — M0609 Rheumatoid arthritis without rheumatoid factor, multiple sites: Secondary | ICD-10-CM

## 2018-11-23 DIAGNOSIS — M67479 Ganglion, unspecified ankle and foot: Secondary | ICD-10-CM

## 2018-11-23 DIAGNOSIS — M79642 Pain in left hand: Secondary | ICD-10-CM

## 2018-11-23 DIAGNOSIS — M79641 Pain in right hand: Secondary | ICD-10-CM

## 2018-11-23 DIAGNOSIS — Z8709 Personal history of other diseases of the respiratory system: Secondary | ICD-10-CM

## 2018-11-23 DIAGNOSIS — Z8261 Family history of arthritis: Secondary | ICD-10-CM

## 2018-11-23 DIAGNOSIS — J309 Allergic rhinitis, unspecified: Secondary | ICD-10-CM

## 2018-11-23 DIAGNOSIS — G4726 Circadian rhythm sleep disorder, shift work type: Secondary | ICD-10-CM

## 2018-11-23 DIAGNOSIS — Z8659 Personal history of other mental and behavioral disorders: Secondary | ICD-10-CM

## 2018-11-23 DIAGNOSIS — G43709 Chronic migraine without aura, not intractable, without status migrainosus: Secondary | ICD-10-CM

## 2018-11-23 MED ORDER — HYDROXYCHLOROQUINE SULFATE 200 MG PO TABS: 200.0000 mg | ORAL_TABLET | Freq: Two times a day (BID) | ORAL | 0 refills | Status: DC

## 2018-11-23 NOTE — Progress Notes (Signed)
Pharmacy Note  Subjective: Patient presents today to the [facility] to see Dr. Estanislado Pandy.  Patient seen by the pharmacist for counseling on hydroxychloroquine rheumatoid arthritis.  Prior therapy includes: methotrexate and he developed a rash after one dose.  Objective: CMP     Component Value Date/Time   NA 137 10/08/2018 1023   NA 142 07/03/2017 1521   K 3.8 10/08/2018 1023   CL 104 10/08/2018 1023   CO2 26 10/08/2018 1023   GLUCOSE 98 10/08/2018 1023   BUN 10 10/08/2018 1023   BUN 17 07/03/2017 1521   CREATININE 0.86 10/08/2018 1023   CALCIUM 9.1 10/08/2018 1023   PROT 6.5 10/08/2018 1023   PROT 6.5 10/08/2018 1023   PROT 6.8 07/03/2017 1521   ALBUMIN 4.5 07/03/2017 1521   AST 16 10/08/2018 1023   ALT 16 10/08/2018 1023   ALKPHOS 64 07/03/2017 1521   BILITOT 0.7 10/08/2018 1023   BILITOT 0.3 07/03/2017 1521   GFRNONAA 121 10/08/2018 1023   GFRAA 141 10/08/2018 1023    CBC    Component Value Date/Time   WBC 7.2 10/08/2018 1023   RBC 5.33 10/08/2018 1023   HGB 15.1 10/08/2018 1023   HGB 14.5 07/03/2017 1521   HCT 44.5 10/08/2018 1023   HCT 44.2 07/03/2017 1521   PLT 218 10/08/2018 1023   PLT 188 07/03/2017 1521   MCV 83.5 10/08/2018 1023   MCV 83 07/03/2017 1521   MCH 28.3 10/08/2018 1023   MCHC 33.9 10/08/2018 1023   RDW 12.8 10/08/2018 1023   RDW 13.8 07/03/2017 1521   LYMPHSABS 3,089 10/08/2018 1023   LYMPHSABS 2.1 07/03/2017 1521   MONOABS 1.0 12/30/2017 0644   EOSABS 151 10/08/2018 1023   EOSABS 0.2 07/03/2017 1521   BASOSABS 29 10/08/2018 1023   BASOSABS 0.0 07/03/2017 1521    Assessment/Plan: Patient was counseled on the purpose, proper use, and adverse effects of hydroxychloroquine including nausea/diarrhea, skin rash, headaches, and sun sensitivity.  Discussed importance of annual eye exams while on hydroxychloroquine to monitor to ocular toxicity and discussed importance of frequent laboratory monitoring.  Provided patient with eye  exam form for baseline ophthalmologic exam and standing lab instructions.  Provided patient with educational materials on hydroxychloroquine and answered all questions.  Patient consented to hydroxychloroquine.  Will upload consent in the media tab.    Dose will be Plaquenil 200 mg twice daily based off weight of 101 kg and height of 5'11'.  All questions encouraged and answered.  Instructed patient to call with any questions or concerns.  Mariella Saa, PharmD, [place], [place] [facility] Pharmacist [phone]  11/23/2018 1:56 PM

## 2018-11-23 NOTE — Patient Instructions (Signed)
Please obtain Plaquenil toxicity eye exam prior to next follow up appointment.  Standing Labs We placed an order today for your standing lab work.    Please come back and get your standing labs in 1 month, 3 months, then every 5 months.  We have open lab daily Monday through Thursday from 8:30-12:30 PM and 1:30-4:30 PM and Friday from 8:30-12:30 PM and 1:30-4:00 PM at the office of Dr. Bo Merino.   You may experience shorter wait times on Monday and Friday afternoons. The office is located at [address] [place], [place], [place] [address] No appointment is necessary.   Labs are drawn by [facility].  You may receive a bill from [facility] for your lab work.  If you wish to have your labs drawn at another location, please call the office 24 hours in advance to send orders.  If you have any questions regarding directions or hours of operation,  please call [phone].   Just as a reminder please drink plenty of water prior to coming for your lab work. Thanks!  Vaccines You are taking a medication(s) that can suppress your immune system.  The following immunizations are recommended: . Flu annually . Pneumonia (Pneumovax 23 and Prevnar 13 spaced at least 1 year apart) . Shingrix  Please check with your PCP to make sure you are up to date.   Hydroxychloroquine tablets What is this medicine? HYDROXYCHLOROQUINE (hye drox ee KLOR oh kwin) is used to treat rheumatoid arthritis and systemic lupus erythematosus. It is also used to treat malaria. This medicine may be used for other purposes; ask your health care provider or pharmacist if you have questions. COMMON BRAND NAME(S): Plaquenil, Quineprox What should I tell my health care provider before I take this medicine? They need to know if you have any of these conditions:  diabetes  eye disease, vision problems  G6PD deficiency  heart disease  history of irregular heartbeat  if you often drink alcohol  kidney  disease  liver disease  porphyria  psoriasis  an unusual or allergic reaction to chloroquine, hydroxychloroquine, other medicines, foods, dyes, or preservatives  pregnant or trying to get pregnant  breast-feeding How should I use this medicine? Take this medicine by mouth with a glass of water. Follow the directions on the prescription label. Do not cut, crush or chew this medicine. Swallow the tablets whole. Take this medicine with food. Avoid taking antacids within 4 hours of taking this medicine. It is best to separate these medicines by at least 4 hours. Take your medicine at regular intervals. Do not take it more often than directed. Take all of your medicine as directed even if you think you are better. Do not skip doses or stop your medicine early. Talk to your pediatrician regarding the use of this medicine in children. While this drug may be prescribed for selected conditions, precautions do apply. Overdosage: If you think you have taken too much of this medicine contact a poison control center or emergency room at once. NOTE: This medicine is only for you. Do not share this medicine with others. What if I miss a dose? If you miss a dose, take it as soon as you can. If it is almost time for your next dose, take only that dose. Do not take double or extra doses. What may interact with this medicine? Do not take this medicine with any of the following medications:  cisapride  dronedarone  pimozide  thioridazine This medicine may also interact  with the following medications:  ampicillin  antacids  cimetidine  cyclosporine  digoxin  kaolin  medicines for diabetes, like insulin, glipizide, glyburide  medicines for seizures like carbamazepine, phenobarbital, phenytoin  mefloquine  methotrexate  other medicines that prolong the QT interval (cause an abnormal heart rhythm)  praziquantel This list may not describe all possible interactions. Give your health care  provider a list of all the medicines, herbs, non-prescription drugs, or dietary supplements you use. Also tell them if you smoke, drink alcohol, or use illegal drugs. Some items may interact with your medicine. What should I watch for while using this medicine? Visit your health care professional for regular checks on your progress. Tell your health care professional if your symptoms do not start to get better or if they get worse. You may need blood work done while you are taking this medicine. If you take other medicines that can affect heart rhythm, you may need more testing. Talk to your health care professional if you have questions. Your vision may be tested before and during use of this medicine. Tell your health care professional right away if you have any change in your eyesight. What side effects may I notice from receiving this medicine? Side effects that you should report to your doctor or health care professional as soon as possible:  allergic reactions like skin rash, itching or hives, swelling of the face, lips, or tongue  changes in vision  decreased hearing or ringing of the ears  muscle weakness  redness, blistering, peeling or loosening of the skin, including inside the mouth  sensitivity to light  signs and symptoms of a dangerous change in heartbeat or heart rhythm like chest pain; dizziness; fast or irregular heartbeat; palpitations; feeling faint or lightheaded, falls; breathing problems  signs and symptoms of liver injury like dark yellow or brown urine; general ill feeling or flu-like symptoms; light-colored stools; loss of appetite; nausea; right upper belly pain; unusually weak or tired; yellowing of the eyes or skin  signs and symptoms of low blood sugar such as feeling anxious; confusion; dizziness; increased hunger; unusually weak or tired; sweating; shakiness; cold; irritable; headache; blurred vision; fast heartbeat; loss of consciousness  suicidal  thoughts  uncontrollable head, mouth, neck, arm, or leg movements Side effects that usually do not require medical attention (report to your doctor or health care professional if they continue or are bothersome):  diarrhea  dizziness  hair loss  headache  irritable  loss of appetite  nausea, vomiting  stomach pain This list may not describe all possible side effects. Call your doctor for medical advice about side effects. You may report side effects to FDA at 1-800-FDA-1088. Where should I keep my medicine? Keep out of the reach of children. Store at room temperature between 15 and 30 degrees C (59 and 86 degrees F). Protect from moisture and light. Throw away any unused medicine after the expiration date. NOTE: This sheet is a summary. It may not cover all possible information. If you have questions about this medicine, talk to your doctor, pharmacist, or health care provider.  2020 Elsevier/Gold Standard (2018-05-17 12:56:32)

## 2018-11-26 LAB — GLIADIN ANTIBODIES, SERUM
Gliadin IgA: <1 Units
Gliadin IgG: 4 Units

## 2018-11-26 LAB — TISSUE TRANSGLUTAMINASE, IGA: (tTG) Ab, IgA: 1 U/mL

## 2018-11-28 NOTE — Progress Notes (Signed)
Please notify patient that the test for celiac disease were negative.

## 2018-12-03 ENCOUNTER — Ambulatory Visit: Payer: 59 | Admitting: Physician Assistant

## 2018-12-03 DIAGNOSIS — Z8261 Family history of arthritis: Secondary | ICD-10-CM

## 2018-12-03 DIAGNOSIS — R21 Rash and other nonspecific skin eruption: Secondary | ICD-10-CM

## 2018-12-03 DIAGNOSIS — Z8659 Personal history of other mental and behavioral disorders: Secondary | ICD-10-CM

## 2018-12-03 DIAGNOSIS — G4726 Circadian rhythm sleep disorder, shift work type: Secondary | ICD-10-CM

## 2018-12-06 ENCOUNTER — Encounter: Payer: Self-pay | Admitting: Emergency Medicine

## 2018-12-06 NOTE — Progress Notes (Deleted)
   Virtual Visit via Telephone Note  I connected with Michael Lam on 12/15/18 at  9:30 AM EST by telephone and verified that I am speaking with the correct person using two identifiers.  Location: Patient: Home  Provider: Clinic  This service was conducted via virtual visit.  The patient was located at home. I was located in my office.  Consent was obtained prior to the virtual visit and is aware of possible charges through their insurance for this visit.  The patient is an established patient.  Dr. Estanislado Pandy, MD conducted the virtual visit and Hazel Sams, PA-C acted as scribe during the service.  Office staff helped with scheduling follow up visits after the service was conducted.   I discussed the limitations, risks, security and privacy concerns of performing an evaluation and management service by telephone and the availability of in person appointments. I also discussed with the patient that there may be a patient responsible charge related to this service. The patient expressed understanding and agreed to proceed.  CC: History of Present Illness: Patient is a 23 year male with history of seronegative rheumatoid arthritis.  He is taking Plaquenil   Review of Systems  Constitutional: Negative for fever and malaise/fatigue.  Eyes: Negative for photophobia, pain, discharge and redness.  Respiratory: Negative for cough, shortness of breath and wheezing.   Cardiovascular: Negative for chest pain and palpitations.  Gastrointestinal: Negative for blood in stool, constipation and diarrhea.  Genitourinary: Negative for dysuria.  Musculoskeletal: Negative for back pain, joint pain, myalgias and neck pain.  Skin: Negative for rash.  Neurological: Negative for dizziness and headaches.  Psychiatric/Behavioral: Negative for depression. The patient is not nervous/anxious and does not have insomnia.       Observations/Objective: Physical Exam  Constitutional: He is oriented to  person, place, and time.  Neurological: He is alert and oriented to person, place, and time.  Psychiatric: Mood, memory, affect and judgment normal.   Patient reports morning stiffness for  {minute/hour:19697}.   Patient {Actions; denies-reports:120008} nocturnal pain.  Difficulty dressing/grooming: {ACTIONS;DENIES/REPORTS:21021675::"Denies"} Difficulty climbing stairs: {ACTIONS;DENIES/REPORTS:21021675::"Denies"} Difficulty getting out of chair: {ACTIONS;DENIES/REPORTS:21021675::"Denies"} Difficulty using hands for taps, buttons, cutlery, and/or writing: {ACTIONS;DENIES/REPORTS:21021675::"Denies"}   Assessment and Plan: Visit Diagnoses: Rheumatoid arthritis of multiple sites with negative rheumatoid factor ([place])-  High risk medication use - D/C MTX-rash, Started on PLQ at last visit   Ganglion cyst of foot  Family history of rheumatoid arthritis  History of anxiety  Chronic migraine without aura without status migrainosus, not intractable  History of asthma  Chronic allergic rhinitis  Shift work sleep disorder  Follow Up Instructions: He will follow up in    I discussed the assessment and treatment plan with the patient. The patient was provided an opportunity to ask questions and all were answered. The patient agreed with the plan and demonstrated an understanding of the instructions.   The patient was advised to call back or seek an in-person evaluation if the symptoms worsen or if the condition fails to improve as anticipated.  I provided *** minutes of non-face-to-face time during this encounter.   Ofilia Neas, PA-C

## 2018-12-20 ENCOUNTER — Other Ambulatory Visit: Payer: Self-pay

## 2018-12-20 ENCOUNTER — Encounter: Payer: 59 | Admitting: Rheumatology

## 2018-12-21 NOTE — Progress Notes (Signed)
This encounter was created in error - please disregard.

## 2018-12-28 NOTE — Progress Notes (Signed)
Virtual Visit via Telephone Note  I connected with Michael Lam on 12/29/18 at 11:00 AM EST by telephone and verified that I am speaking with the correct person using two identifiers.  Location: Patient: Home Provider: Clinic  This service was conducted via virtual visit. The patient was located at home. I was located in my office.  Consent was obtained prior to the virtual visit and is aware of possible charges through their insurance for this visit.  The patient is an established patient.  Dr. Estanislado Pandy, MD conducted the virtual visit and Hazel Sams, PA-C acted as scribe during the service.  Office staff helped with scheduling follow up visits after the service was conducted.     I discussed the limitations, risks, security and privacy concerns of performing an evaluation and management service by telephone and the availability of in person appointments. I also discussed with the patient that there may be a patient responsible charge related to this service. The patient expressed understanding and agreed to proceed.  CC: Discuss medications  History of Present Illness: Patient is a 23 year old male with a past medical history of seronegative rheumatoid arthritis.  He has not started taking plaquenil due to not picking up the prescription.  He discontinued MTX in the past due to developing a rash.  He has had 2 flares since his last visit on 11/23/18.  He reports last week he was experiencing pain and mild inflammation in the left wrist joint.  He is not experiencing any joint pain or joint swelling currently.   Review of Systems  Constitutional: Negative for fever and malaise/fatigue.  Eyes: Negative for photophobia, pain, discharge and redness.  Respiratory: Negative for cough, shortness of breath and wheezing.   Cardiovascular: Negative for chest pain and palpitations.  Gastrointestinal: Negative for blood in stool, constipation and diarrhea.  Genitourinary: Negative for  dysuria.  Musculoskeletal: Negative for back pain, joint pain, myalgias and neck pain.       +Morning stiffness   Skin: Negative for rash.  Neurological: Negative for dizziness and headaches.  Psychiatric/Behavioral: Negative for depression. The patient is not nervous/anxious and does not have insomnia.       Observations/Objective: Physical Exam  Constitutional: He is oriented to person, place, and time.  Neurological: He is alert and oriented to person, place, and time.  Psychiatric: Mood, memory, affect and judgment normal.   Patient reports morning stiffness for 5 minutes.   Patient denies nocturnal pain.  Difficulty dressing/grooming: Denies Difficulty climbing stairs: Denies Difficulty getting out of chair: Denies Difficulty using hands for taps, buttons, cutlery, and/or writing: Denies  He currently rates his RA a 0/10.    Assessment and Plan: Visit Diagnoses: Rheumatoid arthritis of multiple sites with negative rheumatoid factor ([facility])-He has had 2 flares in the left wrist joint since his last visit on 11/23/18. The most recent flare was last week.  He experienced tenderness and mild inflammation in the left wrist joint, which was self resolving in several days.  He is not having any joint pain or joint swelling currently.  He has morning stiffness for 5 minutes daily.  He did not start on Plaquenil after his last visit on 11/23/18 due to not picking up the prescription.  He plans on starting plaquenil 200 mg 1 tablet by mouth twice daily.  He will require a baseline PLQ eye exam within 1 month of starting on PLQ.  He will return for lab work in 1 month, 3 months, and then every  5 months.  He will follow up in the office in 2 months to assess his response.   High risk medication use -He will be starting on plaquenil 200 mg 1 tablet by mouth twice daily. He did not pick up the prescription from the pharmacy after his last visit on 11/23/18.   He will require a baseline PLQ eye exam  within in 1 month of starting on PLQ.  D/c MTX due to rash.   Other medical conditions are listed as follows:  Ganglion cyst of foot  Family history of rheumatoid arthritis  History of anxiety  Chronic migraine without aura without status migrainosus, not intractable  History of asthma  Chronic allergic rhinitis  Shift work sleep disorder   Follow Up Instructions: He will follow up in 2 months.    I discussed the assessment and treatment plan with the patient. The patient was provided an opportunity to ask questions and all were answered. The patient agreed with the plan and demonstrated an understanding of the instructions.   The patient was advised to call back or seek an in-person evaluation if the symptoms worsen or if the condition fails to improve as anticipated.  I provided 15 minutes of non-face-to-face time during this encounter.   Pollyann Savoy, MD    Scribed by-  Sherron Ales PA-C

## 2018-12-29 ENCOUNTER — Telehealth (INDEPENDENT_AMBULATORY_CARE_PROVIDER_SITE_OTHER): Payer: 59 | Admitting: Rheumatology

## 2018-12-29 ENCOUNTER — Encounter: Payer: Self-pay | Admitting: Rheumatology

## 2018-12-29 ENCOUNTER — Other Ambulatory Visit: Payer: Self-pay

## 2018-12-29 DIAGNOSIS — Z8261 Family history of arthritis: Secondary | ICD-10-CM

## 2018-12-29 DIAGNOSIS — M0609 Rheumatoid arthritis without rheumatoid factor, multiple sites: Secondary | ICD-10-CM | POA: Diagnosis not present

## 2018-12-29 DIAGNOSIS — Z8659 Personal history of other mental and behavioral disorders: Secondary | ICD-10-CM

## 2018-12-29 DIAGNOSIS — M67479 Ganglion, unspecified ankle and foot: Secondary | ICD-10-CM | POA: Diagnosis not present

## 2018-12-29 DIAGNOSIS — Z79899 Other long term (current) drug therapy: Secondary | ICD-10-CM

## 2018-12-29 DIAGNOSIS — Z8709 Personal history of other diseases of the respiratory system: Secondary | ICD-10-CM

## 2018-12-29 DIAGNOSIS — G4726 Circadian rhythm sleep disorder, shift work type: Secondary | ICD-10-CM | POA: Diagnosis not present

## 2018-12-29 DIAGNOSIS — G43709 Chronic migraine without aura, not intractable, without status migrainosus: Secondary | ICD-10-CM

## 2019-01-05 NOTE — Addendum Note (Signed)
Addended by: Ofilia Neas on: 01/05/2019 03:42 PM   Modules accepted: Level of Service, SmartSet

## 2019-01-05 NOTE — Progress Notes (Signed)
This encounter was created in error - please disregard.

## 2019-01-11 ENCOUNTER — Telehealth: Payer: Self-pay | Admitting: Rheumatology

## 2019-01-11 NOTE — Telephone Encounter (Signed)
-----   Message from Shona Needles, RT sent at 12/30/2018 11:23 AM EST ----- Regarding: 2 MONTH F/U

## 2019-01-11 NOTE — Telephone Encounter (Signed)
I tried contacting patient to schedule a return office visit for 2 months (around 03/02/2019). Patient did not answer, and was not able to leave a voicemail.

## 2019-05-09 ENCOUNTER — Ambulatory Visit: Payer: 59 | Admitting: Emergency Medicine

## 2019-05-26 ENCOUNTER — Encounter: Payer: 59 | Admitting: Emergency Medicine

## 2019-08-22 ENCOUNTER — Other Ambulatory Visit: Payer: Self-pay

## 2019-08-22 ENCOUNTER — Emergency Department (HOSPITAL_COMMUNITY)
Admission: EM | Admit: 2019-08-22 | Discharge: 2019-08-22 | Disposition: A | Discharge status: Home / Self Care | Payer: No Typology Code available for payment source | Attending: Emergency Medicine | Admitting: Emergency Medicine

## 2019-08-22 ENCOUNTER — Emergency Department (HOSPITAL_COMMUNITY): Payer: No Typology Code available for payment source

## 2019-08-22 DIAGNOSIS — J45909 Unspecified asthma, uncomplicated: Secondary | ICD-10-CM | POA: Diagnosis not present

## 2019-08-22 DIAGNOSIS — R1031 Right lower quadrant pain: Secondary | ICD-10-CM | POA: Insufficient documentation

## 2019-08-22 DIAGNOSIS — R110 Nausea: Secondary | ICD-10-CM | POA: Insufficient documentation

## 2019-08-22 DIAGNOSIS — Z79899 Other long term (current) drug therapy: Secondary | ICD-10-CM | POA: Diagnosis not present

## 2019-08-22 LAB — COMPREHENSIVE METABOLIC PANEL
ALT: 55 U/L — ABNORMAL HIGH (ref 0–44)
AST: 32 U/L (ref 15–41)
Albumin: 4.3 g/dL (ref 3.5–5.0)
Alkaline Phosphatase: 49 U/L (ref 38–126)
Anion gap: 9 (ref 5–15)
BUN: 11 mg/dL (ref 6–20)
CO2: 26 mmol/L (ref 22–32)
Calcium: 9.2 mg/dL (ref 8.9–10.3)
Chloride: 104 mmol/L (ref 98–111)
Creatinine, Ser: 1.02 mg/dL (ref 0.61–1.24)
GFR calc Af Amer: >60 mL/min (ref >60)
GFR calc non Af Amer: >60 mL/min (ref >60)
Glucose, Bld: 95 mg/dL (ref 70–99)
Potassium: 4.2 mmol/L (ref 3.5–5.1)
Sodium: 139 mmol/L (ref 135–145)
Total Bilirubin: 0.5 mg/dL (ref 0.3–1.2)
Total Protein: 6.8 g/dL (ref 6.5–8.1)

## 2019-08-22 LAB — CBC WITH DIFFERENTIAL/PLATELET
Abs Immature Granulocytes: 0.04 10*3/uL (ref 0.00–0.07)
Basophils Absolute: 0 10*3/uL (ref 0.0–0.1)
Basophils Relative: 0 %
Eosinophils Absolute: 0.2 10*3/uL (ref 0.0–0.5)
Eosinophils Relative: 2 %
HCT: 48.9 % (ref 39.0–52.0)
Hemoglobin: 15.7 g/dL (ref 13.0–17.0)
Immature Granulocytes: 1 %
Lymphocytes Relative: 40 %
Lymphs Abs: 3.5 10*3/uL (ref 0.7–4.0)
MCH: 27.4 pg (ref 26.0–34.0)
MCHC: 32.1 g/dL (ref 30.0–36.0)
MCV: 85.2 fL (ref 80.0–100.0)
Monocytes Absolute: 0.9 10*3/uL (ref 0.1–1.0)
Monocytes Relative: 11 %
Neutro Abs: 4 10*3/uL (ref 1.7–7.7)
Neutrophils Relative %: 46 %
Platelets: 209 10*3/uL (ref 150–400)
RBC: 5.74 MIL/uL (ref 4.22–5.81)
RDW: 12.3 % (ref 11.5–15.5)
WBC: 8.7 10*3/uL (ref 4.0–10.5)
nRBC: 0 % (ref 0.0–0.2)

## 2019-08-22 LAB — URINALYSIS, ROUTINE W REFLEX MICROSCOPIC
Bilirubin Urine: NEGATIVE
Glucose, UA: NEGATIVE mg/dL
Hgb urine dipstick: NEGATIVE
Ketones, ur: NEGATIVE mg/dL
Leukocytes,Ua: NEGATIVE
Nitrite: NEGATIVE
Protein, ur: NEGATIVE mg/dL
Specific Gravity, Urine: 1.019 (ref 1.005–1.030)
pH: 6 (ref 5.0–8.0)

## 2019-08-22 LAB — LIPASE, BLOOD: Lipase: 47 U/L (ref 11–51)

## 2019-08-22 MED ORDER — POLYETHYLENE GLYCOL 3350 17 G PO PACK
17.0000 g | PACK | Freq: Every day | ORAL | 0 refills | Status: DC
Start: 2019-08-22 — End: 2021-09-20

## 2019-08-22 MED ORDER — FENTANYL CITRATE (PF) 100 MCG/2ML IJ SOLN
100.0000 ug | INTRAMUSCULAR | Status: DC | PRN
  Administered 2019-08-22: 100 ug via INTRAVENOUS
  Filled 2019-08-22: qty 2

## 2019-08-22 MED ORDER — LACTATED RINGERS IV BOLUS
1000.0000 mL | Freq: Once | INTRAVENOUS | Status: AC
  Administered 2019-08-22: 1000 mL via INTRAVENOUS

## 2019-08-22 MED ORDER — ONDANSETRON 4 MG PO TBDP
ORAL_TABLET | ORAL | 0 refills | Status: DC
Start: 2019-08-22 — End: 2020-02-20

## 2019-08-22 MED ORDER — MAGNESIUM CITRATE PO SOLN
1.0000 | Freq: Once | ORAL | 0 refills | Status: AC
Start: 2019-08-22 — End: 2019-08-22

## 2019-08-22 MED ORDER — IOHEXOL 300 MG/ML  SOLN
100.0000 mL | Freq: Once | INTRAMUSCULAR | Status: AC | PRN
  Administered 2019-08-22: 100 mL via INTRAVENOUS

## 2019-08-22 MED ORDER — ONDANSETRON HCL 4 MG/2ML IJ SOLN
4.0000 mg | Freq: Once | INTRAMUSCULAR | Status: AC
  Administered 2019-08-22: 4 mg via INTRAVENOUS
  Filled 2019-08-22: qty 2

## 2019-08-22 NOTE — ED Notes (Signed)
Discharge instructions discussed with pt. Pt verbalized understanding with on questions at this time. Pt to go home with Family at bedside.

## 2019-08-22 NOTE — Discharge Instructions (Addendum)
Your CT scan here is negative for appendicitis, kidney stone or any other surgical emergencies.  As you describe bloating and there is gas and evidence of constipation on your CT scan I would suggest trying to help with that to see if it alleviates her symptoms.  At any point if you develop fever, worsening pain, worsening nausea this not controlled with medication, vomiting or other concerns please return here immediately.  There are multiple ways to improve your constipation, some options are listed below and all involve over the counter medications:   * You can try the magnesium citrate 1 bottle per day until you get results. * You can try an enema once daily as needed.  * Generally, you need to increase your fluid intake and take stool softeners twice a day until you follow-up with your primary doctor for further management. * MiraLAX is an osmotic laxative. This means that it will keep electrolytes and water in your stool and allow you to have easier bowel movements.        * One option is to take 8 capfuls of MiraLAX and put in a 32 ounce Gatorade and shake it up.  Drink 4-6 ounces per hour until you have results.  Another way is per the directions below.       * You  can take this medication up to 3 times daily as needed produce bowel movements. However while you're taking this much MiraLAX you need to make sure you are replenishing her electrolytes and water loss. He should also not take it more than 2 days in a row at this level. Generally I recommend people workup to this. For example take 1 capful of MiraLAX once a day for 2-3 days and if you do not get improvement in your bowel habits then take 1 capful twice a day for 2-3 days and if they don't get relief from this and you can take 1 capful 3 times a day for 2 days. If you start having diarrhea, decrease the amount of MiraLAX you are using until you have soft formed stools once to twice a day. Remember during this process that you need to  increase your electrolytes and water intake, so oftentimes sports drinks are good for this. Some people end up taking half or one whole capful daily for the rest of her lives to have regular bowel movements you can do this as you feel is proper.

## 2019-08-22 NOTE — ED Triage Notes (Signed)
Pt from home complaining of RL abdominal pain onset yesterday worsen today. Nausea earlier today. No abdominal surgeries.

## 2019-08-22 NOTE — ED Notes (Signed)
Patient transported to CT 

## 2019-08-22 NOTE — ED Provider Notes (Signed)
[facility] ([place]) EMERGENCY DEPARTMENT Provider Note   CSN: [id number] Arrival date & time: 08/22/19  2118     History Chief Complaint  Patient presents with  . Abdominal Pain    Michael Lam is a 24 y.o. male.   Abdominal Pain Pain location:  RLQ Pain quality: fullness, sharp and shooting   Pain quality: not aching   Pain radiates to:  Does not radiate Pain severity:  Moderate Onset quality:  Sudden Duration:  2 days Timing:  Intermittent Progression:  Worsening Chronicity:  New Context: not alcohol use   Relieved by:  None tried Worsened by:  Nothing Ineffective treatments:  None tried Associated symptoms: nausea   Associated symptoms: no anorexia, no chills, no fatigue, no fever, no hematochezia and no shortness of breath        Past Medical History:  Diagnosis Date  . Allergy   . Anxiety   . Asthma     Patient Active Problem List   Diagnosis Date Noted  . Rheumatoid arthritis of multiple sites with negative rheumatoid factor ([place]) 11/23/2018  . Family history of rheumatoid arthritis 11/23/2018  . Chronic nonintractable headache 02/02/2018  . Chronic allergic rhinitis 11/24/2017  . Shift work sleep disorder 07/06/2017  . Obstructive sleep apnea 07/06/2017  . History of hypoglycemia 07/03/2017  . Ganglion cyst of foot 06/17/2017  . History of asthma 05/19/2017  . Chronic migraine without aura without status migrainosus, not intractable 12/31/2016  . Other complicated headache syndrome 12/09/2016  . Anxiety 04/13/2015    Past Surgical History:  Procedure Laterality Date  . SKIN BIOPSY  10/07/2018   right 5th digit.   . TONSILLECTOMY         Family History  Problem Relation Age of Onset  . Thyroid disease Mother   . Chiari malformation Mother   . Hyperlipidemia Father   . Hypertension Father   . Diabetes Father   . Rheum arthritis Father   . Diabetes Maternal Grandmother   . Migraines Maternal Grandmother   .  Rheum arthritis Maternal Grandmother   . Stroke Maternal Grandfather   . Hypertension Maternal Grandfather   . Stroke Paternal Grandfather   . Healthy Sister   . Healthy Brother     Social History   Tobacco Use  . Smoking status: Never Smoker  . Smokeless tobacco: Never Used  Vaping Use  . Vaping Use: Never used  Substance Use Topics  . Alcohol use: No    Alcohol/week: 0.0 standard drinks    Comment: rarely  . Drug use: No    Home Medications Prior to Admission medications   Medication Sig Start Date End Date Taking? Authorizing Provider  albuterol (PROVENTIL HFA;VENTOLIN HFA) 108 (90 Base) MCG/ACT inhaler Inhale 2 puffs into the lungs every 6 (six) hours as needed for wheezing or shortness of breath. 11/24/17   Horald Pollen, MD  azelastine (ASTELIN) 0.1 % nasal spray Place 2 sprays into both nostrils 2 (two) times daily. Use in each nostril as directed 11/24/17   Horald Pollen, MD  beclomethasone (QVAR REDIHALER) 40 MCG/ACT inhaler Inhale 1 puff into the lungs 2 (two) times daily. Patient taking differently: Inhale 1 puff into the lungs as needed.  11/24/17   Horald Pollen, MD  betamethasone dipropionate (DIPROLENE) 0.05 % ointment at bedtime. 10/07/18   [provider]  blood glucose meter kit and supplies KIT Dispense based on patient and insurance preference. Use up to four times daily as directed. (FOR ICD-9  250.00, 250.01). Patient not taking: Reported on 11/23/2018 07/03/17   Horald Pollen, MD  cetirizine (ZYRTEC) 10 MG tablet Take 1 tablet (10 mg total) by mouth daily for 7 days. 05/24/18 11/23/18  Horald Pollen, MD  fluticasone [facility]) 50 MCG/ACT nasal spray Place 2 sprays into both nostrils daily. 05/24/18   Horald Pollen, MD  halobetasol (ULTRAVATE) 0.05 % cream Apply topically 2 (two) times daily. 07/15/18   Horald Pollen, MD  hydroxychloroquine (PLAQUENIL) 200 MG tablet Take 1 tablet (200 mg total) by mouth 2  (two) times daily. 11/23/18   Bo Merino, MD  magnesium citrate SOLN Take 296 mLs (1 Bottle total) by mouth once for 1 dose. 08/22/19 08/22/19  Stephine Langbehn, Corene Cornea, MD  montelukast (SINGULAIR) 10 MG tablet Take 1 tablet (10 mg total) by mouth at bedtime. 05/24/18   Horald Pollen, MD  ondansetron (ZOFRAN ODT) 4 MG disintegrating tablet 40m ODT q4 hours prn nausea/vomit 08/22/19   Tauheedah Bok, JCorene Cornea MD  ondansetron (ZOFRAN) 4 MG tablet Take 1 tablet (4 mg total) by mouth every 8 (eight) hours as needed for nausea or vomiting. 12/28/17   Rodriguez-Southworth, SSunday Spillers PA-C  polyethylene glycol (MIRALAX / GLYCOLAX) 17 g packet Take 17 g by mouth daily. 08/22/19   Ettel Albergo, JCorene Cornea MD  triamcinolone cream (KENALOG) 0.1 % Apply 1 application topically 2 (two) times daily. 11/24/17   SHorald Pollen MD  zolpidem (AMBIEN) 5 MG tablet Take 1 tablet (5 mg total) by mouth at bedtime as needed for sleep. 05/24/18   SHorald Pollen MD    Allergies    Methotrexate derivatives, Peanut oil, Eggs or egg-derived products, and Hydrocodone-acetaminophen  Review of Systems   Review of Systems  Constitutional: Negative for chills, fatigue and fever.  Respiratory: Negative for shortness of breath.   Gastrointestinal: Positive for abdominal pain and nausea. Negative for anorexia and hematochezia.  All other systems reviewed and are negative.   Physical Exam Updated Vital Signs BP 128/73   Pulse 93   Temp 98.7 F (37.1 C) (Oral)   Resp (!) 22   Ht '5\' 11"'  (1.803 m)   Wt 97.5 kg   SpO2 98%   BMI 29.99 kg/m   Physical Exam Vitals and nursing note reviewed.  Constitutional:      Appearance: He is well-developed.  HENT:     Head: Normocephalic and atraumatic.     Nose: No congestion or rhinorrhea.     Mouth/Throat:     Mouth: Mucous membranes are moist.     Pharynx: Oropharynx is clear.  Eyes:     Pupils: Pupils are equal, round, and reactive to light.  Cardiovascular:     Rate and Rhythm: Normal  rate.  Pulmonary:     Effort: Pulmonary effort is normal. No respiratory distress.  Abdominal:     General: Abdomen is flat. There is no distension.     Tenderness: There is abdominal tenderness. There is no guarding or rebound.  Genitourinary:    Testes: Normal.  Musculoskeletal:        General: Normal range of motion.     Cervical back: Normal range of motion.  Skin:    General: Skin is warm and dry.     Coloration: Skin is not jaundiced or pale.  Neurological:     General: No focal deficit present.     Mental Status: He is alert.     Cranial Nerves: No cranial nerve deficit.     Sensory: No sensory  deficit.     ED Results / Procedures / Treatments   Labs (all labs ordered are listed, but only abnormal results are displayed) Labs Reviewed  COMPREHENSIVE METABOLIC PANEL - Abnormal; Notable for the following components:      Result Value   ALT 55 (*)    All other components within normal limits  URINE CULTURE  CBC WITH DIFFERENTIAL/PLATELET  LIPASE, BLOOD  URINALYSIS, ROUTINE W REFLEX MICROSCOPIC    EKG None  Radiology CT ABDOMEN PELVIS W CONTRAST  Result Date: 08/22/2019 CLINICAL DATA:  Abdominal abscess, infection suspected. Right-sided abdominal pain. EXAM: CT ABDOMEN AND PELVIS WITH CONTRAST TECHNIQUE: Multidetector CT imaging of the abdomen and pelvis was performed using the standard protocol following bolus administration of intravenous contrast. CONTRAST:  143m OMNIPAQUE IOHEXOL 300 MG/ML  SOLN COMPARISON:  None. FINDINGS: Lower chest: The lung bases are clear without focal nodule, mass, or airspace disease. Heart size is normal. No significant pleural or pericardial effusion is present. Hepatobiliary: No focal liver abnormality is seen. No gallstones, gallbladder wall thickening, or biliary dilatation. Pancreas: Unremarkable. No pancreatic ductal dilatation or surrounding inflammatory changes. Spleen: Normal in size without focal abnormality. Adrenals/Urinary Tract:  Adrenal glands are normal bilaterally. Kidneys are unremarkable. No stone mass lesion is present. No obstruction is present. Ureters are within normal limits bilaterally. The urinary bladder is unremarkable. Stomach/Bowel: Stomach is mildly distended. Duodenum is unremarkable. Subcentimeter lymph nodes are present throughout the small bowel mesentery. More prominent lymph nodes are present in the ileocolic ligament. Terminal ileum is within normal limits. The palate junction is normal. Appendix is visualized and normal. Ascending and transverse colon are within normal limits. Descending and sigmoid colon are normal. Vascular/Lymphatic: No significant vascular findings are present. No enlarged abdominal or pelvic lymph nodes. Reproductive: Prostate is unremarkable. Other: No abdominal wall hernia or abnormality. No abdominopelvic ascites. Musculoskeletal: Endplate Schmorl's nodes are present throughout the lumbar spine. Vertebral body heights alignment are maintained. Hips are located and normal. Bony pelvis is unremarkable. IMPRESSION: 1. Subcentimeter lymph nodes throughout the small bowel mesentery and ileocolic ligament are nonspecific, but can be seen in the setting of a nonspecific enteritis. 2. No other acute or focal lesion to explain the patient's symptoms. 3. Endplate Schmorl's nodes throughout the lumbar spine. Electronically Signed   By: CSan MorelleM.D.   On: 08/22/2019 22:46    Procedures Procedures (including critical care time)  Medications Ordered in ED Medications  fentaNYL (SUBLIMAZE) injection 100 mcg (100 mcg Intravenous Given 08/22/19 2238)  lactated ringers bolus 1,000 mL (0 mLs Intravenous Stopped 08/22/19 2242)  ondansetron (ZOFRAN) injection 4 mg (4 mg Intravenous Given 08/22/19 2136)  iohexol (OMNIPAQUE) 300 MG/ML solution 100 mL (100 mLs Intravenous Contrast Given 08/22/19 2239)    ED Course  I have reviewed the triage vital signs and the nursing notes.  Pertinent labs &  imaging results that were available during my care of the patient were reviewed by me and considered in my medical decision making (see chart for details).    MDM Rules/Calculators/A&P                          Evaluated for appendicitis vs nephrolithiasis without any significant findings. Low suspicion for testicular torsion without symptoms or findings of same. Possibly gas vs constipation vs early infection?  No fever or white count to suggest the need for surgical consultation. Will plan for constipation treatment and reeval if not improving.   Final  Clinical Impression(s) / ED Diagnoses Final diagnoses:  Right lower quadrant abdominal pain    Rx / DC Orders ED Discharge Orders         Ordered    ondansetron (ZOFRAN ODT) 4 MG disintegrating tablet     Discontinue  Reprint     08/22/19 2300    polyethylene glycol (MIRALAX / GLYCOLAX) 17 g packet  Daily     Discontinue  Reprint     08/22/19 2300    magnesium citrate SOLN   Once     Discontinue  Reprint     08/22/19 2300           Yacine Droz, Corene Cornea, MD 08/22/19 2322

## 2019-08-24 LAB — URINE CULTURE: Culture: <10000 — AB

## 2019-11-15 ENCOUNTER — Other Ambulatory Visit (HOSPITAL_COMMUNITY): Payer: Self-pay | Admitting: Internal Medicine

## 2019-11-18 IMAGING — MR MR HEAD WO/W CM
11 of 12 series · 31 of 48 positions shown · IV contrast (multihance)
Comparison: None.

CLINICAL DATA: Dizziness, headache and right-sided pressure.
Blurred vision. Two weeks duration.

EXAM:
MRI HEAD WITHOUT AND WITH CONTRAST
TECHNIQUE: Multiplanar, multiecho pulse sequences of the brain and surrounding
structures were obtained without and with intravenous contrast.
CONTRAST:  20mL MULTIHANCE GADOBENATE DIMEGLUMINE 529 MG/ML IV SOLN

[Series 2: T1 · sagittal · 5.0mm · 0.47mm/px · 1 of 23 slices shown (1 of 2)]
[im 1/23]
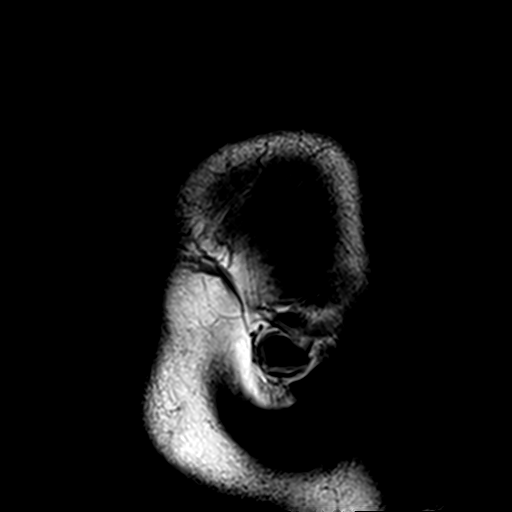

[Series 3: DWI · axial · 5.0mm · 1.88mm/px · z∈[-40,+114]mm · 5 of 50 slices shown (1 of 2)]
[im 1/50]
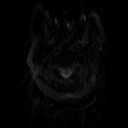
[im 13/50]
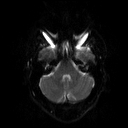
[im 25/50]
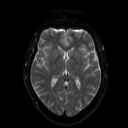
[im 37/50]
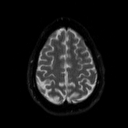
[im 50/50]
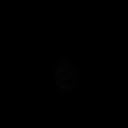

[Series 4: DWI · axial · 5.0mm · 1.88mm/px · z∈[-40,+114]mm · 3 of 25 slices shown (2 of 2)]
[im 1/25]
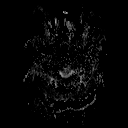
[im 13/25]
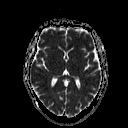
[im 25/25]
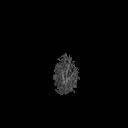

[Series 5: T2 · axial · 5.0mm · 0.54mm/px · z∈[-44,+123]mm · 3 of 27 slices shown (1 of 2)]
[im 1/27]
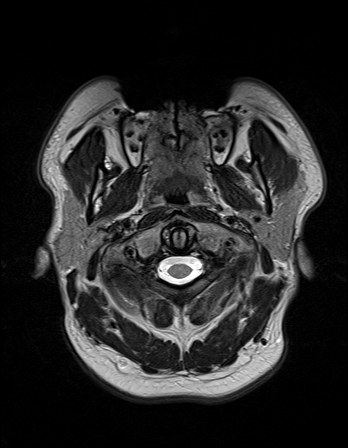
[im 14/27]
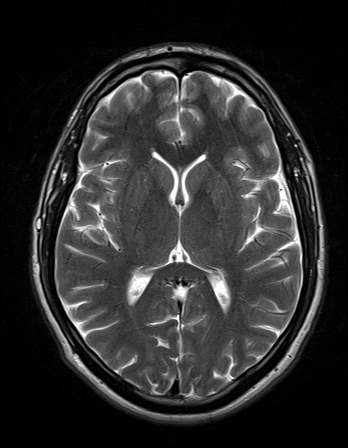
[im 27/27]
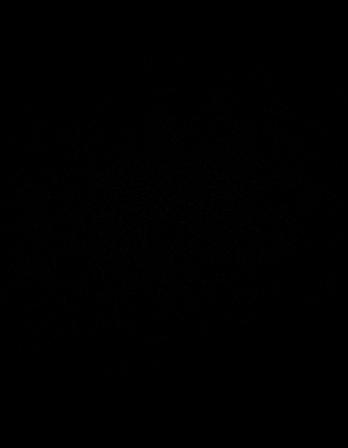

[Series 6: axial grad (blood) · axial · 5.0mm · 0.47mm/px · z∈[-44,+123]mm · 3 of 27 slices shown]
[im 1/27]
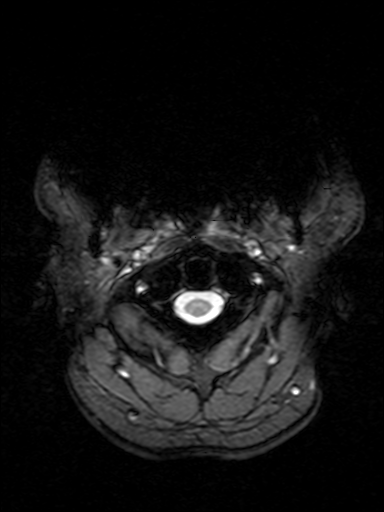
[im 14/27]
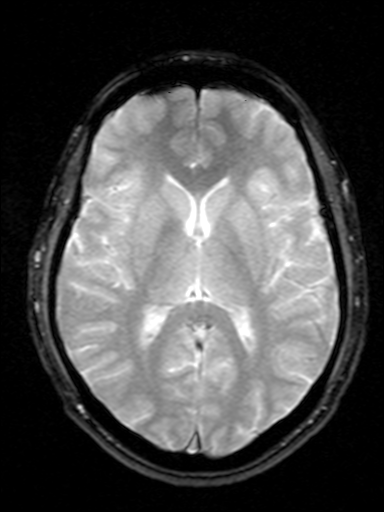
[im 27/27]
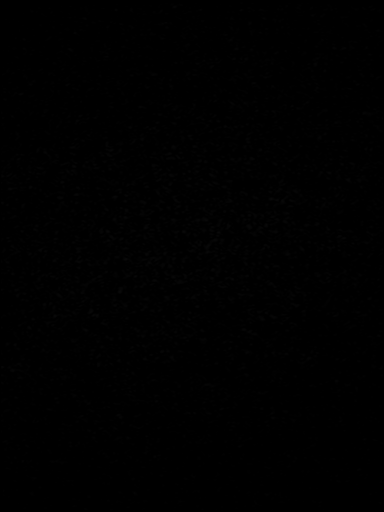

[Series 7: FLAIR · axial · 5.0mm · 0.47mm/px · z∈[-47,+120]mm · 3 of 27 slices shown]
[im 1/27]
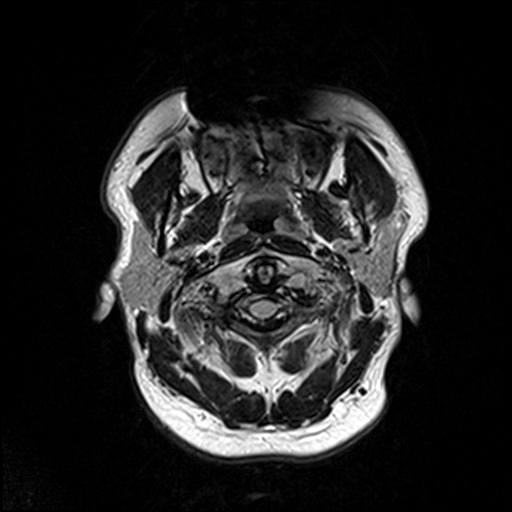
[im 14/27]
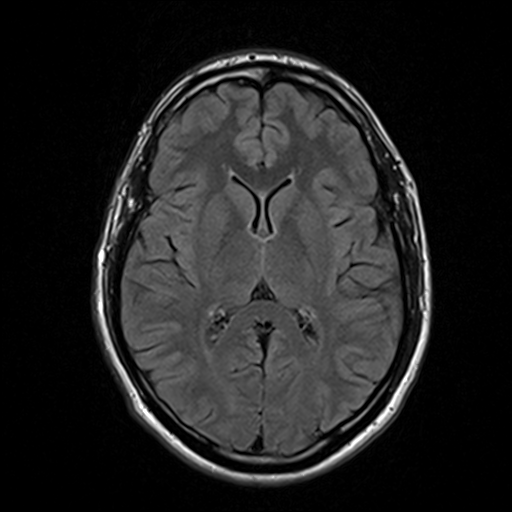
[im 27/27]
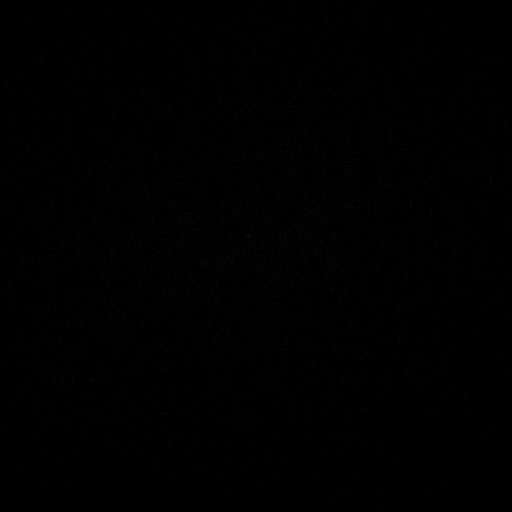

[Series 8: t1_mpr_tra · axial · 2.0mm · 0.47mm/px · 1 of 88 slices shown]
[im 1/88]
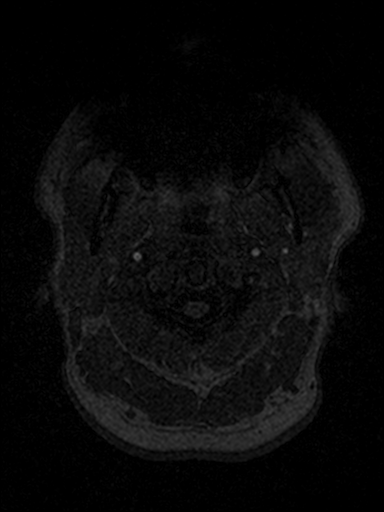

[Series 9: T1 · axial · 5.0mm · 0.47mm/px · z∈[-44,+122]mm · 3 of 27 slices shown (2 of 2)]
[im 1/27]
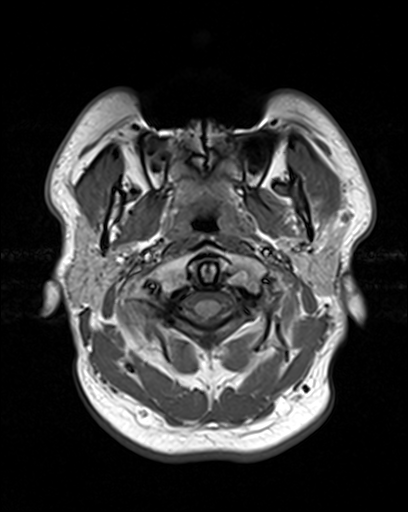
[im 14/27]
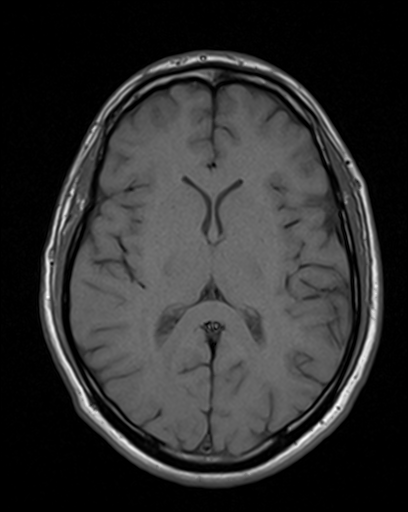
[im 27/27]
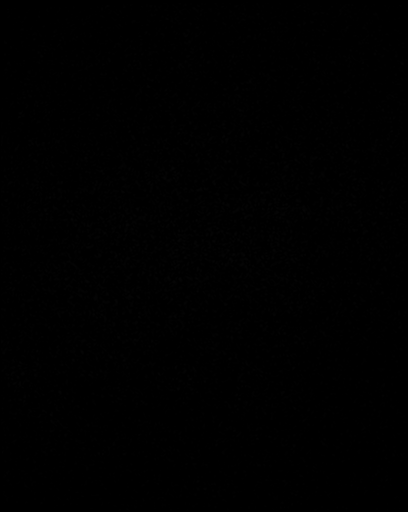

[Series 10: T2 · coronal · 5.0mm · 0.45mm/px · 3 of 30 slices shown (2 of 2)]
[im 1/30]
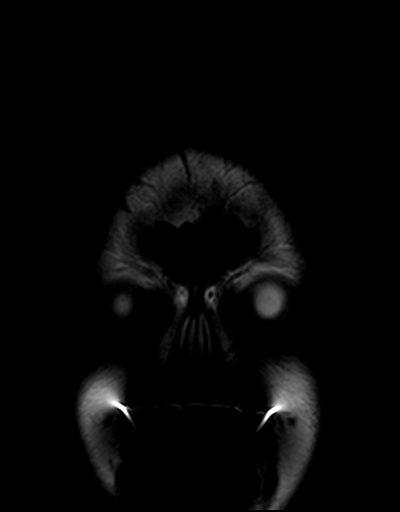
[im 15/30]
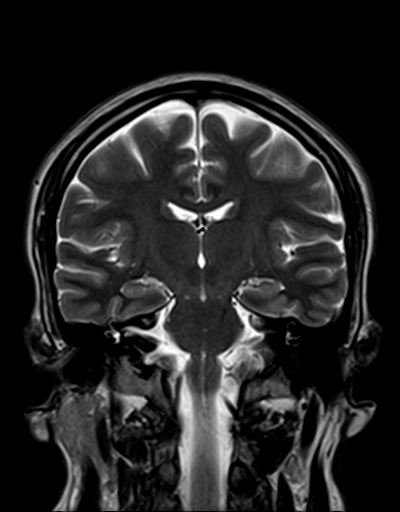
[im 30/30]
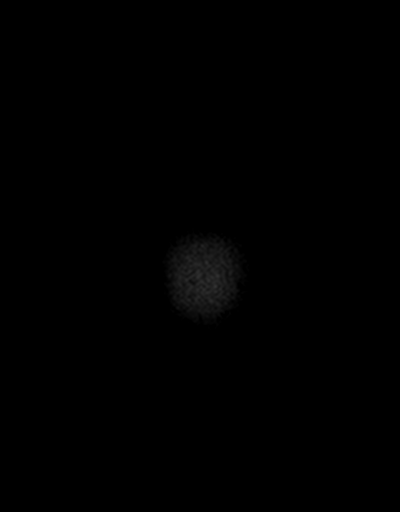

[Series 12: T1 post-contrast · axial · 5.0mm · 0.47mm/px · z∈[-44,+122]mm · 3 of 27 slices shown (1 of 2)]
[im 1/27]
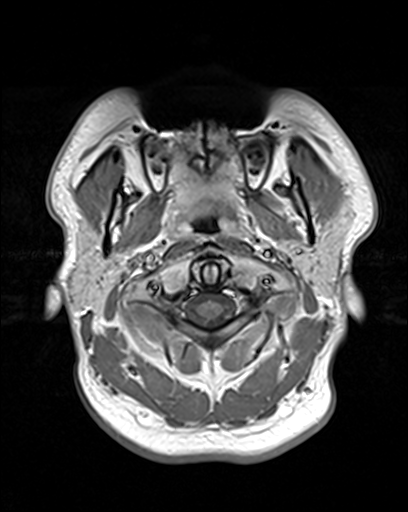
[im 14/27]
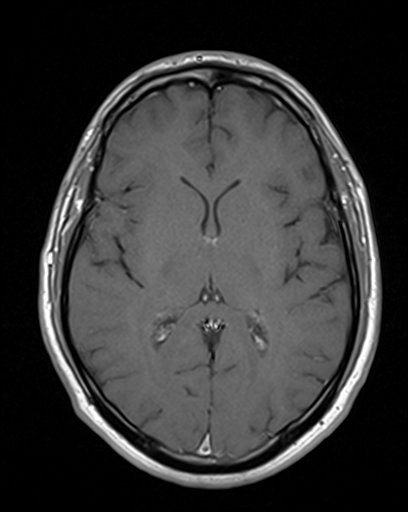
[im 27/27]
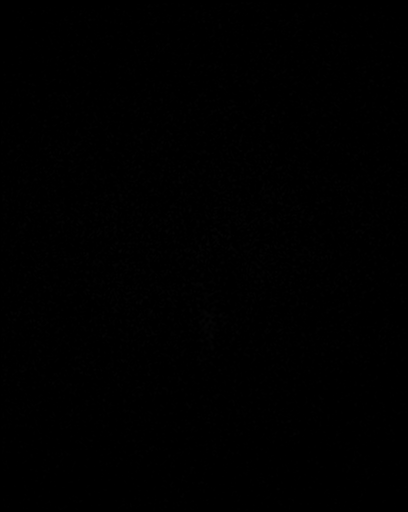

[Series 13: T1 post-contrast · coronal · 5.0mm · 0.45mm/px · 3 of 31 slices shown (2 of 2)]
[im 1/31]
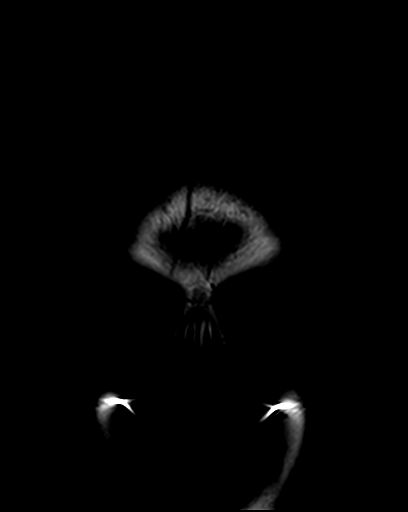
[im 16/31]
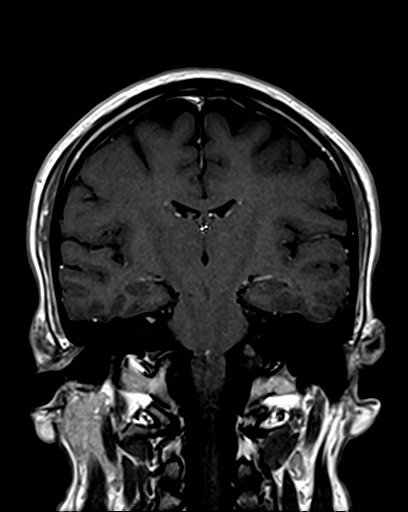
[im 31/31]
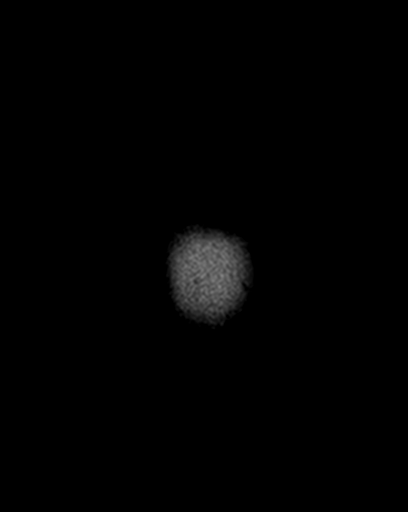

[31 of 48 positions shown; findings below may reference images not displayed]

FINDINGS: Brain: The brain has a normal appearance without evidence of
malformation, atrophy, old or acute small or large vessel
infarction, mass lesion, hemorrhage, hydrocephalus or extra-axial
collection. After contrast administration, no abnormal enhancement
occurs.

Vascular: Major vessels at the base of the brain show flow. Venous
sinuses appear patent.

Skull and upper cervical spine: Normal.

Sinuses/Orbits: Clear/normal.

Other: None significant.
IMPRESSION: Normal examination. No abnormality seen to explain the clinical
presentation.

## 2020-02-01 ENCOUNTER — Ambulatory Visit: Payer: 59 | Admitting: Emergency Medicine

## 2020-02-07 ENCOUNTER — Ambulatory Visit: Payer: 59 | Admitting: Emergency Medicine

## 2020-02-07 ENCOUNTER — Encounter: Payer: Self-pay | Admitting: Emergency Medicine

## 2020-02-08 ENCOUNTER — Encounter: Payer: Self-pay | Admitting: Emergency Medicine

## 2020-02-20 ENCOUNTER — Encounter: Payer: Self-pay | Admitting: Emergency Medicine

## 2020-02-20 ENCOUNTER — Ambulatory Visit (INDEPENDENT_AMBULATORY_CARE_PROVIDER_SITE_OTHER): Payer: No Typology Code available for payment source | Admitting: Emergency Medicine

## 2020-02-20 ENCOUNTER — Other Ambulatory Visit: Payer: Self-pay

## 2020-02-20 ENCOUNTER — Other Ambulatory Visit: Payer: Self-pay | Admitting: Emergency Medicine

## 2020-02-20 VITALS — BP 121/73 | HR 82 | Temp 98.5°F | Resp 16 | Ht 70.0 in | Wt 244.0 lb

## 2020-02-20 DIAGNOSIS — M0609 Rheumatoid arthritis without rheumatoid factor, multiple sites: Secondary | ICD-10-CM

## 2020-02-20 DIAGNOSIS — J309 Allergic rhinitis, unspecified: Secondary | ICD-10-CM

## 2020-02-20 DIAGNOSIS — L309 Dermatitis, unspecified: Secondary | ICD-10-CM | POA: Diagnosis not present

## 2020-02-20 DIAGNOSIS — R053 Chronic cough: Secondary | ICD-10-CM | POA: Diagnosis not present

## 2020-02-20 DIAGNOSIS — J302 Other seasonal allergic rhinitis: Secondary | ICD-10-CM

## 2020-02-20 DIAGNOSIS — Z8709 Personal history of other diseases of the respiratory system: Secondary | ICD-10-CM

## 2020-02-20 DIAGNOSIS — G4700 Insomnia, unspecified: Secondary | ICD-10-CM

## 2020-02-20 DIAGNOSIS — R21 Rash and other nonspecific skin eruption: Secondary | ICD-10-CM

## 2020-02-20 MED ORDER — QVAR REDIHALER 40 MCG/ACT IN AERB: 1.0000 | INHALATION_SPRAY | Freq: Two times a day (BID) | RESPIRATORY_TRACT | 11 refills | Status: DC

## 2020-02-20 MED ORDER — ONDANSETRON 4 MG PO TBDP: 4.0000 mg | ORAL_TABLET | Freq: Three times a day (TID) | ORAL | 3 refills | Status: DC | PRN

## 2020-02-20 MED ORDER — MONTELUKAST SODIUM 10 MG PO TABS: 10.0000 mg | ORAL_TABLET | Freq: Every day | ORAL | 3 refills | Status: DC

## 2020-02-20 MED ORDER — ZOLPIDEM TARTRATE 5 MG PO TABS: 5.0000 mg | ORAL_TABLET | Freq: Every evening | ORAL | 1 refills | Status: DC | PRN

## 2020-02-20 MED ORDER — ALBUTEROL SULFATE HFA 108 (90 BASE) MCG/ACT IN AERS: 2.0000 | INHALATION_SPRAY | Freq: Four times a day (QID) | RESPIRATORY_TRACT | 0 refills | Status: DC | PRN

## 2020-02-20 MED ORDER — AZELASTINE HCL 0.1 % NA SOLN: 2.0000 | Freq: Two times a day (BID) | NASAL | 12 refills | Status: DC

## 2020-02-20 MED ORDER — BETAMETHASONE DIPROPIONATE 0.05 % EX OINT: TOPICAL_OINTMENT | Freq: Every day | CUTANEOUS | 5 refills | Status: DC

## 2020-02-20 NOTE — Progress Notes (Signed)
Michael Lam 25 y.o.   Chief Complaint  Patient presents with  . Psoriasis    Flare-up about 2 weeks ago with pain, itching and dryness  . Medication Refill    Pending    HISTORY OF PRESENT ILLNESS: This is a 25 y.o. male complaining of eczema flareup to his hands the past 2 weeks with itching and dryness.  Needs medication refill. Also requesting blood work.  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  HPI   Prior to Admission medications   Medication Sig Start Date End Date Taking? Authorizing Provider  albuterol (PROVENTIL HFA;VENTOLIN HFA) 108 (90 Base) MCG/ACT inhaler Inhale 2 puffs into the lungs every 6 (six) hours as needed for wheezing or shortness of breath. 11/24/17  Yes Doneen Ollinger, Ines Bloomer, MD  azelastine (ASTELIN) 0.1 % nasal spray Place 2 sprays into both nostrils 2 (two) times daily. Use in each nostril as directed 11/24/17  Yes Srihari Shellhammer, Ines Bloomer, MD  beclomethasone (QVAR REDIHALER) 40 MCG/ACT inhaler Inhale 1 puff into the lungs 2 (two) times daily. Patient taking differently: Inhale 1 puff into the lungs as needed. 11/24/17  Yes Tiea Manninen, Ines Bloomer, MD  betamethasone dipropionate (DIPROLENE) 0.05 % ointment at bedtime. 10/07/18  Yes [provider]  blood glucose meter kit and supplies KIT Dispense based on patient and insurance preference. Use up to four times daily as directed. (FOR ICD-9 250.00, 250.01). 07/03/17  Yes Zafar Debrosse, Ines Bloomer, MD  fluticasone [facility]) 50 MCG/ACT nasal spray Place 2 sprays into both nostrils daily. 05/24/18  Yes Dmarcus Decicco, Ines Bloomer, MD  halobetasol (ULTRAVATE) 0.05 % cream Apply topically 2 (two) times daily. 07/15/18  Yes Marcus Schwandt, Ines Bloomer, MD  montelukast (SINGULAIR) 10 MG tablet Take 1 tablet (10 mg total) by mouth at bedtime. 05/24/18  Yes Horald Pollen, MD  ondansetron (ZOFRAN ODT) 4 MG disintegrating tablet  85m ODT q4 hours prn nausea/vomit 08/22/19  Yes Mesner, JCorene Cornea MD  polyethylene glycol (MIRALAX / GLYCOLAX) 17 g packet Take 17 g by mouth daily. 08/22/19  Yes Mesner, JCorene Cornea MD  triamcinolone cream (KENALOG) 0.1 % Apply 1 application topically 2 (two) times daily. 11/24/17  Yes Shriyans Kuenzi, MInes Bloomer MD  zolpidem (AMBIEN) 5 MG tablet Take 1 tablet (5 mg total) by mouth at bedtime as needed for sleep. 05/24/18  Yes Onnie Hatchel, MInes Bloomer MD  cetirizine (ZYRTEC) 10 MG tablet Take 1 tablet (10 mg total) by mouth daily for 7 days. 05/24/18 11/23/18  SHorald Pollen MD  hydroxychloroquine (PLAQUENIL) 200 MG tablet Take 1 tablet (200 mg total) by mouth 2 (two) times daily. Patient not taking: Reported on 02/20/2020 11/23/18   DBo Merino MD  ondansetron (ZOFRAN) 4 MG tablet Take 1 tablet (4 mg total) by mouth every 8 (eight) hours as needed for nausea or vomiting. Patient not taking: Reported on 02/20/2020 12/28/17   Rodriguez-Southworth, SSunday Spillers PA-C    Allergies  Allergen Reactions  . Methotrexate Derivatives Hives    Itchy rash on upper body and face after 1 dose  . Peanut Oil Rash  . Eggs Or Egg-Derived Products     Other reaction(s): Abdominal Pain  . Hydrocodone-Acetaminophen Rash    Rash on thigh and upper body with itching    Patient Active Problem List   Diagnosis Date Noted  . Rheumatoid arthritis of multiple sites with negative rheumatoid factor ([place] 11/23/2018  . Family history of rheumatoid arthritis 11/23/2018  . Chronic  nonintractable headache 02/02/2018  . Chronic allergic rhinitis 11/24/2017  . Shift work sleep disorder 07/06/2017  . Obstructive sleep apnea 07/06/2017  . History of hypoglycemia 07/03/2017  . Ganglion cyst of foot 06/17/2017  . History of asthma 05/19/2017  . Chronic migraine without aura without status migrainosus, not intractable 12/31/2016  . Other complicated headache syndrome 12/09/2016  . Anxiety 04/13/2015    Past Medical History:  Diagnosis  Date  . Allergy   . Anxiety   . Asthma     Past Surgical History:  Procedure Laterality Date  . SKIN BIOPSY  10/07/2018   right 5th digit.   . TONSILLECTOMY      Social History   Socioeconomic History  . Marital status: Single    Spouse name: Not on file  . Number of children: 0  . Years of education: Not on file  . Highest education level: Associate degree: academic program  Occupational History    Employer: [employer]  Tobacco Use  . Smoking status: Never Smoker  . Smokeless tobacco: Never Used  Vaping Use  . Vaping Use: Never used  Substance and Sexual Activity  . Alcohol use: No    Alcohol/week: 0.0 standard drinks    Comment: rarely  . Drug use: No  . Sexual activity: Not Currently  Other Topics Concern  . 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 Thyroid disease Mother   . Chiari malformation Mother   . Hyperlipidemia Father   . Hypertension Father   . Diabetes Father   . Rheum arthritis Father   . Diabetes Maternal Grandmother   . Migraines Maternal Grandmother   . Rheum arthritis Maternal Grandmother   . Stroke Maternal Grandfather   . Hypertension Maternal Grandfather   . Stroke Paternal Grandfather   . Healthy Sister   . Healthy Brother      Review of Systems  Constitutional: Negative.  Negative for chills and fever.  HENT: Negative.  Negative for congestion and sore throat.   Respiratory: Negative.  Negative for cough and shortness of breath.   Cardiovascular: Negative.  Negative for chest pain and  palpitations.  Gastrointestinal: Positive for nausea. Negative for abdominal pain, diarrhea and vomiting.  Genitourinary: Negative.  Negative for dysuria and hematuria.  Musculoskeletal: Negative.  Negative for back pain, myalgias and neck pain.  Skin: Positive for itching and rash.  Neurological: Negative.  Negative for dizziness and headaches.  All other systems reviewed and are negative.  Today's Vitals   02/20/20 1427  BP: 121/73  Pulse: 82  Resp: 16  Temp: 98.5 F (36.9 C)  TempSrc: Temporal  SpO2: 97%  Weight: 244 lb (110.7 kg)  Height: 5' 10" (1.778 m)   Body mass index is 35.01 kg/m. Wt Readings from Last 3 Encounters:  02/20/20 244 lb (110.7 kg)  08/22/19 215 lb (97.5 kg)  11/23/18 222 lb 9.6 oz (101 kg)     Physical Exam Vitals reviewed.  Constitutional:      Appearance: Normal appearance.  HENT:     Head: Normocephalic.  Eyes:     Extraocular Movements: Extraocular movements intact.     Conjunctiva/sclera: Conjunctivae normal.     Pupils: Pupils are equal, round, and reactive to light.  Cardiovascular:     Rate and Rhythm: Normal rate and regular rhythm.     Pulses: Normal pulses.     Heart sounds: Normal heart sounds.  Pulmonary:     Effort: Pulmonary effort is normal.     Breath sounds: Normal breath sounds.  Musculoskeletal:        General: Normal range of motion.     Cervical back: Normal range of motion and neck supple.  Skin:    General: Skin is warm and dry.     Capillary Refill: Capillary refill takes less than 2 seconds.  Neurological:     General: No focal deficit present.     Mental Status: He is alert and oriented to person, place, and time.  Psychiatric:        Mood and Affect: Mood normal.        Behavior: Behavior normal.      ASSESSMENT & PLAN: Abdulkarim was seen today for psoriasis and medication refill.  Diagnoses and all orders for this visit:  Eczema of both hands -     betamethasone dipropionate (DIPROLENE) 0.05 %  ointment; Apply topically at bedtime.  History of asthma -     albuterol (VENTOLIN HFA) 108 (90 Base) MCG/ACT inhaler; Inhale 2 puffs into the lungs every 6 (six) hours as needed for wheezing or shortness of breath. -     beclomethasone (QVAR REDIHALER) 40 MCG/ACT inhaler; Inhale 1 puff into the lungs 2 (two) times daily.  Chronic allergic rhinitis -     azelastine (ASTELIN) 0.1 % nasal spray; Place 2 sprays into both nostrils 2 (two) times daily. Use in each nostril as directed -     Comprehensive metabolic panel -     Lipid panel -     Hemoglobin A1c -     CBC with Differential/Platelet  Chronic cough -     beclomethasone (QVAR REDIHALER) 40 MCG/ACT inhaler; Inhale 1 puff into the lungs 2 (two) times daily.  Seasonal allergies -     montelukast (SINGULAIR) 10 MG tablet; Take 1 tablet (10 mg total) by mouth at bedtime.  Rash and nonspecific skin eruption  Insomnia, unspecified type -     zolpidem (AMBIEN) 5 MG tablet; Take 1 tablet (5 mg total) by mouth at bedtime as needed for sleep.  Rheumatoid arthritis of multiple sites with negative rheumatoid factor (HCC)  Other orders -     Discontinue: ondansetron (ZOFRAN ODT) 4 MG disintegrating tablet; Take 1 tablet (4 mg total) by mouth every 8 (eight) hours as needed for nausea or vomiting. -     ondansetron (ZOFRAN ODT) 4 MG disintegrating tablet; Take 1 tablet (4 mg total) by mouth every 8 (eight) hours as needed for nausea or vomiting.     Patient Instructions       If you have lab work done today you will be contacted with your lab results within the next 2 weeks.  If you have not heard from us then please contact us. The fastest way to get your results is to register for My Chart.   IF you received an x-ray today, you will receive an invoice from [facility]. Please contact [facility] at [phone] with questions   or concerns regarding your invoice.   IF you received labwork today, you will receive an  invoice from [facility]. Please contact [facility] at [phone] with questions or concerns regarding your invoice.   Our billing staff will not be able to assist you with questions regarding bills from these companies.  You will be contacted with the lab results as soon as they are available. The fastest way to get your results is to activate your My Chart account. Instructions are located on the last page of this paperwork. If you have not heard from us regarding the results in 2 weeks, please contact this office.     Health Maintenance, Male Adopting a healthy lifestyle and getting preventive care are important in promoting health and wellness. Ask your health care provider about:  The right schedule for you to have regular tests and exams.  Things you can do on your own to prevent diseases and keep yourself healthy. What should I know about diet, weight, and exercise? Eat a healthy diet  Eat a diet that includes plenty of vegetables, fruits, low-fat dairy products, and lean protein.  Do not eat a lot of foods that are high in solid fats, added sugars, or sodium.   Maintain a healthy weight Body mass index (BMI) is a measurement that can be used to identify possible weight problems. It estimates body fat based on height and weight. Your health care provider can help determine your BMI and help you achieve or maintain a healthy weight. Get regular exercise Get regular exercise. This is one of the most important things you can do for your health. Most adults should:  Exercise for at least 150 minutes each week. The exercise should increase your heart rate and make you sweat (moderate-intensity exercise).  Do strengthening exercises at least twice a week. This is in addition to the moderate-intensity exercise.  Spend less time sitting. Even light physical activity can be beneficial. Watch cholesterol and blood lipids Have your blood tested for lipids and cholesterol at 26 years of age,  then have this test every 5 years. You may need to have your cholesterol levels checked more often if:  Your lipid or cholesterol levels are high.  You are older than 26 years of age.  You are at high risk for heart disease. What should I know about cancer screening? Many types of cancers can be detected early and may often be prevented. Depending on your health history and family history, you may need to have cancer screening at various ages. This may include screening for:  Colorectal cancer.  Prostate cancer.  Skin cancer.  Lung cancer. What should I know about heart disease, diabetes, and high blood pressure? Blood pressure and heart disease  High blood pressure causes heart disease and increases the risk of stroke. This is more likely to develop in people who have high blood pressure readings, are of African descent, or are overweight.  Talk with your health care provider about your target blood pressure readings.  Have your blood pressure checked: ? Every 3-5 years if you are 18-39 years of age. ? Every year if you are 40 years old or older.  If you are between the ages of 65 and 75 and are a current or former smoker, ask your health care provider if you should have a one-time screening for abdominal aortic aneurysm (AAA). Diabetes Have regular diabetes screenings. This checks your fasting blood sugar level. Have the screening done:  Once every three years after   age 45 if you are at a normal weight and have a low risk for diabetes.  More often and at a younger age if you are overweight or have a high risk for diabetes. What should I know about preventing infection? Hepatitis B If you have a higher risk for hepatitis B, you should be screened for this virus. Talk with your health care provider to find out if you are at risk for hepatitis B infection. Hepatitis C Blood testing is recommended for:  Everyone born from 1945 through 1965.  Anyone with known risk factors  for hepatitis C. Sexually transmitted infections (STIs)  You should be screened each year for STIs, including gonorrhea and chlamydia, if: ? You are sexually active and are younger than 26 years of age. ? You are older than 26 years of age and your health care provider tells you that you are at risk for this type of infection. ? Your sexual activity has changed since you were last screened, and you are at increased risk for chlamydia or gonorrhea. Ask your health care provider if you are at risk.  Ask your health care provider about whether you are at high risk for HIV. Your health care provider may recommend a prescription medicine to help prevent HIV infection. If you choose to take medicine to prevent HIV, you should first get tested for HIV. You should then be tested every 3 months for as long as you are taking the medicine. Follow these instructions at home: Lifestyle  Do not use any products that contain nicotine or tobacco, such as cigarettes, e-cigarettes, and chewing tobacco. If you need help quitting, ask your health care provider.  Do not use street drugs.  Do not share needles.  Ask your health care provider for help if you need support or information about quitting drugs. Alcohol use  Do not drink alcohol if your health care provider tells you not to drink.  If you drink alcohol: ? Limit how much you have to 0-2 drinks a day. ? Be aware of how much alcohol is in your drink. In the [place] one drink equals one 12 oz bottle of beer (355 mL), one 5 oz glass of wine (148 mL), or one 1 oz glass of hard liquor (44 mL). General instructions  Schedule regular health, dental, and eye exams.  Stay current with your vaccines.  Tell your health care provider if: ? You often feel depressed. ? You have ever been abused or do not feel safe at home. Summary  Adopting a healthy lifestyle and getting preventive care are important in promoting health and wellness.  Follow your health  care provider's instructions about healthy diet, exercising, and getting tested or screened for diseases.  Follow your health care provider's instructions on monitoring your cholesterol and blood pressure. This information is not intended to replace advice given to you by your health care provider. Make sure you discuss any questions you have with your health care provider. Document Revised: 12/30/2017 Document Reviewed: 12/30/2017 Elsevier Patient Education  2021 Elsevier Inc.       , MD [facility] 

## 2020-02-20 NOTE — Patient Instructions (Addendum)
   If you have lab work done today you will be contacted with your lab results within the next 2 weeks.  If you have not heard from us then please contact us. The fastest way to get your results is to register for My Chart.   IF you received an x-ray today, you will receive an invoice from [facility]. Please contact [facility] at [phone] with questions or concerns regarding your invoice.   IF you received labwork today, you will receive an invoice from [facility]. Please contact [facility] at [phone] with questions or concerns regarding your invoice.   Our billing staff will not be able to assist you with questions regarding bills from these companies.  You will be contacted with the lab results as soon as they are available. The fastest way to get your results is to activate your My Chart account. Instructions are located on the last page of this paperwork. If you have not heard from us regarding the results in 2 weeks, please contact this office.      Health Maintenance, Male Adopting a healthy lifestyle and getting preventive care are important in promoting health and wellness. Ask your health care provider about:  The right schedule for you to have regular tests and exams.  Things you can do on your own to prevent diseases and keep yourself healthy. What should I know about diet, weight, and exercise? Eat a healthy diet  Eat a diet that includes plenty of vegetables, fruits, low-fat dairy products, and lean protein.  Do not eat a lot of foods that are high in solid fats, added sugars, or sodium.   Maintain a healthy weight Body mass index (BMI) is a measurement that can be used to identify possible weight problems. It estimates body fat based on height and weight. Your health care provider can help determine your BMI and help you achieve or maintain a healthy weight. Get regular exercise Get regular exercise. This is one of the most important things you  can do for your health. Most adults should:  Exercise for at least 150 minutes each week. The exercise should increase your heart rate and make you sweat (moderate-intensity exercise).  Do strengthening exercises at least twice a week. This is in addition to the moderate-intensity exercise.  Spend less time sitting. Even light physical activity can be beneficial. Watch cholesterol and blood lipids Have your blood tested for lipids and cholesterol at 26 years of age, then have this test every 5 years. You may need to have your cholesterol levels checked more often if:  Your lipid or cholesterol levels are high.  You are older than 26 years of age.  You are at high risk for heart disease. What should I know about cancer screening? Many types of cancers can be detected early and may often be prevented. Depending on your health history and family history, you may need to have cancer screening at various ages. This may include screening for:  Colorectal cancer.  Prostate cancer.  Skin cancer.  Lung cancer. What should I know about heart disease, diabetes, and high blood pressure? Blood pressure and heart disease  High blood pressure causes heart disease and increases the risk of stroke. This is more likely to develop in people who have high blood pressure readings, are of African descent, or are overweight.  Talk with your health care provider about your target blood pressure readings.  Have your blood pressure checked: ? Every 3-5 years if you are   18-39 years of age. ? Every year if you are 40 years old or older.  If you are between the ages of 65 and 75 and are a current or former smoker, ask your health care provider if you should have a one-time screening for abdominal aortic aneurysm (AAA). Diabetes Have regular diabetes screenings. This checks your fasting blood sugar level. Have the screening done:  Once every three years after age 45 if you are at a normal weight and have  a low risk for diabetes.  More often and at a younger age if you are overweight or have a high risk for diabetes. What should I know about preventing infection? Hepatitis B If you have a higher risk for hepatitis B, you should be screened for this virus. Talk with your health care provider to find out if you are at risk for hepatitis B infection. Hepatitis C Blood testing is recommended for:  Everyone born from 1945 through 1965.  Anyone with known risk factors for hepatitis C. Sexually transmitted infections (STIs)  You should be screened each year for STIs, including gonorrhea and chlamydia, if: ? You are sexually active and are younger than 26 years of age. ? You are older than 26 years of age and your health care provider tells you that you are at risk for this type of infection. ? Your sexual activity has changed since you were last screened, and you are at increased risk for chlamydia or gonorrhea. Ask your health care provider if you are at risk.  Ask your health care provider about whether you are at high risk for HIV. Your health care provider may recommend a prescription medicine to help prevent HIV infection. If you choose to take medicine to prevent HIV, you should first get tested for HIV. You should then be tested every 3 months for as long as you are taking the medicine. Follow these instructions at home: Lifestyle  Do not use any products that contain nicotine or tobacco, such as cigarettes, e-cigarettes, and chewing tobacco. If you need help quitting, ask your health care provider.  Do not use street drugs.  Do not share needles.  Ask your health care provider for help if you need support or information about quitting drugs. Alcohol use  Do not drink alcohol if your health care provider tells you not to drink.  If you drink alcohol: ? Limit how much you have to 0-2 drinks a day. ? Be aware of how much alcohol is in your drink. In the [place] one drink equals one 12  oz bottle of beer (355 mL), one 5 oz glass of wine (148 mL), or one 1 oz glass of hard liquor (44 mL). General instructions  Schedule regular health, dental, and eye exams.  Stay current with your vaccines.  Tell your health care provider if: ? You often feel depressed. ? You have ever been abused or do not feel safe at home. Summary  Adopting a healthy lifestyle and getting preventive care are important in promoting health and wellness.  Follow your health care provider's instructions about healthy diet, exercising, and getting tested or screened for diseases.  Follow your health care provider's instructions on monitoring your cholesterol and blood pressure. This information is not intended to replace advice given to you by your health care provider. Make sure you discuss any questions you have with your health care provider. Document Revised: 12/30/2017 Document Reviewed: 12/30/2017 Elsevier Patient Education  2021 Elsevier Inc.  

## 2020-02-21 LAB — COMPREHENSIVE METABOLIC PANEL
ALT: 65 IU/L — ABNORMAL HIGH (ref 0–44)
AST: 47 IU/L — ABNORMAL HIGH (ref 0–40)
Albumin/Globulin Ratio: 2.4 — ABNORMAL HIGH (ref 1.2–2.2)
Albumin: 4.6 g/dL (ref 4.1–5.2)
Alkaline Phosphatase: 63 IU/L (ref 44–121)
BUN/Creatinine Ratio: 16 (ref 9–20)
BUN: 13 mg/dL (ref 6–20)
Bilirubin Total: 0.2 mg/dL (ref 0.0–1.2)
CO2: 25 mmol/L (ref 20–29)
Calcium: 9.6 mg/dL (ref 8.7–10.2)
Chloride: 103 mmol/L (ref 96–106)
Creatinine, Ser: 0.79 mg/dL (ref 0.76–1.27)
GFR calc Af Amer: 144 mL/min/{1.73_m2} (ref >59)
GFR calc non Af Amer: 125 mL/min/{1.73_m2} (ref >59)
Globulin, Total: 1.9 g/dL (ref 1.5–4.5)
Glucose: 86 mg/dL (ref 65–99)
Potassium: 4.5 mmol/L (ref 3.5–5.2)
Sodium: 139 mmol/L (ref 134–144)
Total Protein: 6.5 g/dL (ref 6.0–8.5)

## 2020-02-21 LAB — CBC WITH DIFFERENTIAL/PLATELET
Basophils Absolute: 0 10*3/uL (ref 0.0–0.2)
Basos: 0 %
EOS (ABSOLUTE): 0.2 10*3/uL (ref 0.0–0.4)
Eos: 3 %
Hematocrit: 44.1 % (ref 37.5–51.0)
Hemoglobin: 14.9 g/dL (ref 13.0–17.7)
Immature Grans (Abs): 0 10*3/uL (ref 0.0–0.1)
Immature Granulocytes: 0 %
Lymphocytes Absolute: 2.3 10*3/uL (ref 0.7–3.1)
Lymphs: 33 %
MCH: 28 pg (ref 26.6–33.0)
MCHC: 33.8 g/dL (ref 31.5–35.7)
MCV: 83 fL (ref 79–97)
Monocytes Absolute: 0.8 10*3/uL (ref 0.1–0.9)
Monocytes: 11 %
Neutrophils Absolute: 3.8 10*3/uL (ref 1.4–7.0)
Neutrophils: 53 %
Platelets: 196 10*3/uL (ref 150–450)
RBC: 5.32 x10E6/uL (ref 4.14–5.80)
RDW: 12.1 % (ref 11.6–15.4)
WBC: 7.2 10*3/uL (ref 3.4–10.8)

## 2020-02-21 LAB — HEMOGLOBIN A1C
Est. average glucose Bld gHb Est-mCnc: 100 mg/dL
Hgb A1c MFr Bld: 5.1 % (ref 4.8–5.6)

## 2020-02-21 LAB — LIPID PANEL
Chol/HDL Ratio: 3.9 ratio (ref 0.0–5.0)
Cholesterol, Total: 178 mg/dL (ref 100–199)
HDL: 46 mg/dL (ref >39)
LDL Chol Calc (NIH): 107 mg/dL — ABNORMAL HIGH (ref 0–99)
Triglycerides: 139 mg/dL (ref 0–149)
VLDL Cholesterol Cal: 25 mg/dL (ref 5–40)

## 2020-02-29 ENCOUNTER — Other Ambulatory Visit (HOSPITAL_COMMUNITY): Payer: Self-pay | Admitting: Emergency Medicine

## 2020-02-29 ENCOUNTER — Telehealth: Payer: Self-pay | Admitting: Emergency Medicine

## 2020-02-29 NOTE — Telephone Encounter (Signed)
FMLA PAPERWORK CAME VIA FAX TO BE COMPLETED BY PROVIDER . PLACED PAPERWORK IN FMLA BOX / PATIENT WAS CALLED BUT UNABLE TO LVM TO LET HIM KNOW THAT HE NEEDS TO COME IN AND PAY 15.00 FEE .

## 2020-02-29 NOTE — Telephone Encounter (Signed)
FYI to message about paperwork

## 2020-03-01 ENCOUNTER — Telehealth: Payer: Self-pay | Admitting: Emergency Medicine

## 2020-03-01 NOTE — Telephone Encounter (Signed)
Tried reaching out to patient multiple times at [phone] to let patient know he needs to come in or call to pay 15.00 admin fee for FMLA paperwork/ that was received from [employer] on 02/29/2020  recording that the call cannot ne completed as dialed . Unable to leave voice mail

## 2020-03-02 NOTE — Telephone Encounter (Signed)
When he is called to collect his fee please ask if this is leave for himself or if this is leave to care for his father. This was a previous miscommunication.  Thank you

## 2020-03-02 NOTE — Telephone Encounter (Signed)
Patient's phone not allowing calls to go through. Spoke with patient's mother Michael Lam (called [phone]). She will inform him that he needs to come into the office to pay the $15 fee before the FMLA paperwork is completed. Rose also clarified that the [facility] paperwork is for the patient, NOT his father.

## 2020-03-05 ENCOUNTER — Ambulatory Visit: Payer: No Typology Code available for payment source | Admitting: Family Medicine

## 2020-03-05 NOTE — Telephone Encounter (Signed)
Spoke with father. He will have the patient call to schedule an appointment.

## 2020-03-05 NOTE — Telephone Encounter (Signed)
Another copy of forms has come to the office. Saved in [place] file pt has appt for 03/08/2020 to discuss leave

## 2020-03-05 NOTE — Telephone Encounter (Signed)
Pt will also need appt pertaning to his reason for disability as no recent encounter for this last visit was 02/20/2020 for acute issues

## 2020-03-08 ENCOUNTER — Ambulatory Visit (INDEPENDENT_AMBULATORY_CARE_PROVIDER_SITE_OTHER): Payer: No Typology Code available for payment source | Admitting: Emergency Medicine

## 2020-03-08 ENCOUNTER — Telehealth: Payer: Self-pay | Admitting: *Deleted

## 2020-03-08 ENCOUNTER — Other Ambulatory Visit: Payer: Self-pay

## 2020-03-08 ENCOUNTER — Encounter: Payer: Self-pay | Admitting: Emergency Medicine

## 2020-03-08 VITALS — BP 125/74 | HR 72 | Temp 98.1°F | Resp 16 | Ht 71.0 in | Wt 241.0 lb

## 2020-03-08 DIAGNOSIS — L409 Psoriasis, unspecified: Secondary | ICD-10-CM | POA: Diagnosis not present

## 2020-03-08 DIAGNOSIS — L309 Dermatitis, unspecified: Secondary | ICD-10-CM | POA: Diagnosis not present

## 2020-03-08 NOTE — Patient Instructions (Addendum)
   If you have lab work done today you will be contacted with your lab results within the next 2 weeks.  If you have not heard from us then please contact us. The fastest way to get your results is to register for My Chart.   IF you received an x-ray today, you will receive an invoice from [facility]. Please contact [facility] at [phone] with questions or concerns regarding your invoice.   IF you received labwork today, you will receive an invoice from [facility]. Please contact [facility] at [phone] with questions or concerns regarding your invoice.   Our billing staff will not be able to assist you with questions regarding bills from these companies.  You will be contacted with the lab results as soon as they are available. The fastest way to get your results is to activate your My Chart account. Instructions are located on the last page of this paperwork. If you have not heard from us regarding the results in 2 weeks, please contact this office.      Health Maintenance, Male Adopting a healthy lifestyle and getting preventive care are important in promoting health and wellness. Ask your health care provider about:  The right schedule for you to have regular tests and exams.  Things you can do on your own to prevent diseases and keep yourself healthy. What should I know about diet, weight, and exercise? Eat a healthy diet  Eat a diet that includes plenty of vegetables, fruits, low-fat dairy products, and lean protein.  Do not eat a lot of foods that are high in solid fats, added sugars, or sodium.   Maintain a healthy weight Body mass index (BMI) is a measurement that can be used to identify possible weight problems. It estimates body fat based on height and weight. Your health care provider can help determine your BMI and help you achieve or maintain a healthy weight. Get regular exercise Get regular exercise. This is one of the most important things you  can do for your health. Most adults should:  Exercise for at least 150 minutes each week. The exercise should increase your heart rate and make you sweat (moderate-intensity exercise).  Do strengthening exercises at least twice a week. This is in addition to the moderate-intensity exercise.  Spend less time sitting. Even light physical activity can be beneficial. Watch cholesterol and blood lipids Have your blood tested for lipids and cholesterol at 26 years of age, then have this test every 5 years. You may need to have your cholesterol levels checked more often if:  Your lipid or cholesterol levels are high.  You are older than 26 years of age.  You are at high risk for heart disease. What should I know about cancer screening? Many types of cancers can be detected early and may often be prevented. Depending on your health history and family history, you may need to have cancer screening at various ages. This may include screening for:  Colorectal cancer.  Prostate cancer.  Skin cancer.  Lung cancer. What should I know about heart disease, diabetes, and high blood pressure? Blood pressure and heart disease  High blood pressure causes heart disease and increases the risk of stroke. This is more likely to develop in people who have high blood pressure readings, are of African descent, or are overweight.  Talk with your health care provider about your target blood pressure readings.  Have your blood pressure checked: ? Every 3-5 years if you are   18-39 years of age. ? Every year if you are 40 years old or older.  If you are between the ages of 65 and 75 and are a current or former smoker, ask your health care provider if you should have a one-time screening for abdominal aortic aneurysm (AAA). Diabetes Have regular diabetes screenings. This checks your fasting blood sugar level. Have the screening done:  Once every three years after age 45 if you are at a normal weight and have  a low risk for diabetes.  More often and at a younger age if you are overweight or have a high risk for diabetes. What should I know about preventing infection? Hepatitis B If you have a higher risk for hepatitis B, you should be screened for this virus. Talk with your health care provider to find out if you are at risk for hepatitis B infection. Hepatitis C Blood testing is recommended for:  Everyone born from 1945 through 1965.  Anyone with known risk factors for hepatitis C. Sexually transmitted infections (STIs)  You should be screened each year for STIs, including gonorrhea and chlamydia, if: ? You are sexually active and are younger than 26 years of age. ? You are older than 26 years of age and your health care provider tells you that you are at risk for this type of infection. ? Your sexual activity has changed since you were last screened, and you are at increased risk for chlamydia or gonorrhea. Ask your health care provider if you are at risk.  Ask your health care provider about whether you are at high risk for HIV. Your health care provider may recommend a prescription medicine to help prevent HIV infection. If you choose to take medicine to prevent HIV, you should first get tested for HIV. You should then be tested every 3 months for as long as you are taking the medicine. Follow these instructions at home: Lifestyle  Do not use any products that contain nicotine or tobacco, such as cigarettes, e-cigarettes, and chewing tobacco. If you need help quitting, ask your health care provider.  Do not use street drugs.  Do not share needles.  Ask your health care provider for help if you need support or information about quitting drugs. Alcohol use  Do not drink alcohol if your health care provider tells you not to drink.  If you drink alcohol: ? Limit how much you have to 0-2 drinks a day. ? Be aware of how much alcohol is in your drink. In the [place] one drink equals one 12  oz bottle of beer (355 mL), one 5 oz glass of wine (148 mL), or one 1 oz glass of hard liquor (44 mL). General instructions  Schedule regular health, dental, and eye exams.  Stay current with your vaccines.  Tell your health care provider if: ? You often feel depressed. ? You have ever been abused or do not feel safe at home. Summary  Adopting a healthy lifestyle and getting preventive care are important in promoting health and wellness.  Follow your health care provider's instructions about healthy diet, exercising, and getting tested or screened for diseases.  Follow your health care provider's instructions on monitoring your cholesterol and blood pressure. This information is not intended to replace advice given to you by your health care provider. Make sure you discuss any questions you have with your health care provider. Document Revised: 12/30/2017 Document Reviewed: 12/30/2017 Elsevier Patient Education  2021 Elsevier Inc.  

## 2020-03-08 NOTE — Telephone Encounter (Signed)
Per Aram Beecham, pt FMLA filled out and faxed today after patients appointment in office. Copy provided for pick up by pt, one for scan and one for file.

## 2020-03-08 NOTE — Telephone Encounter (Signed)
Gave forms for pt to Aram Beecham to be completed at todays appt.

## 2020-03-08 NOTE — Telephone Encounter (Signed)
Faxed completed forms for FMLA to [employer] [phone]). Confirmation at 10:21 am.

## 2020-03-08 NOTE — Progress Notes (Signed)
Michael Lam 25 y.o.   Chief Complaint  Patient presents with  . Exzema    Per patient to completed paperwork for FMLA for hands    HISTORY OF PRESENT ILLNESS: This is a 25 y.o. male here to have FMLA paperwork filled out. Patient has a history of chronic eczema to both hands and was diagnosed by dermatologist at [facility] with psoriasis. Occasional flareups create a lot of inflammation with pain and decreased range of motion to both hands.  During the flareups [name] is not able to engage or perform essential job functions.  He is an emergency department registered nurse. No other complaints or medical concerns today.  HPI   Prior to Admission medications   Medication Sig Start Date End Date Taking? Authorizing Provider  albuterol (VENTOLIN HFA) 108 (90 Base) MCG/ACT inhaler Inhale 2 puffs into the lungs every 6 (six) hours as needed for wheezing or shortness of breath. 02/20/20   Horald Pollen, MD  azelastine (ASTELIN) 0.1 % nasal spray Place 2 sprays into both nostrils 2 (two) times daily. Use in each nostril as directed 02/20/20   Horald Pollen, MD  beclomethasone (QVAR REDIHALER) 40 MCG/ACT inhaler Inhale 1 puff into the lungs 2 (two) times daily. 02/20/20   Horald Pollen, MD  betamethasone dipropionate (DIPROLENE) 0.05 % ointment Apply topically at bedtime. 02/20/20   Horald Pollen, MD  blood glucose meter kit and supplies KIT Dispense based on patient and insurance preference. Use up to four times daily as directed. (FOR ICD-9 250.00, 250.01). 07/03/17   Horald Pollen, MD  cetirizine (ZYRTEC) 10 MG tablet Take 1 tablet (10 mg total) by mouth daily for 7 days. 05/24/18 11/23/18  Horald Pollen, MD  fluticasone [facility]) 50 MCG/ACT nasal spray Place 2 sprays into both nostrils daily. 05/24/18   Horald Pollen, MD  montelukast (SINGULAIR) 10 MG tablet Take 1 tablet (10 mg total) by mouth at bedtime. 02/20/20    Horald Pollen, MD  ondansetron (ZOFRAN ODT) 4 MG disintegrating tablet Take 1 tablet (4 mg total) by mouth every 8 (eight) hours as needed for nausea or vomiting. 02/20/20   Horald Pollen, MD  polyethylene glycol (MIRALAX / GLYCOLAX) 17 g packet Take 17 g by mouth daily. 08/22/19   Mesner, Corene Cornea, MD  zolpidem (AMBIEN) 5 MG tablet Take 1 tablet (5 mg total) by mouth at bedtime as needed for sleep. 02/20/20   Horald Pollen, MD    Allergies  Allergen Reactions  . Methotrexate Derivatives Hives    Itchy rash on upper body and face after 1 dose  . Peanut Oil Rash  . Eggs Or Egg-Derived Products     Other reaction(s): Abdominal Pain  . Hydrocodone-Acetaminophen Rash    Rash on thigh and upper body with itching  . Plaquenil [Hydroxychloroquine] Rash    Over the body with itching    Patient Active Problem List   Diagnosis Date Noted  . Rheumatoid arthritis of multiple sites with negative rheumatoid factor ([place]) 11/23/2018  . Family history of rheumatoid arthritis 11/23/2018  . Chronic nonintractable headache 02/02/2018  . Chronic allergic rhinitis 11/24/2017  . Shift work sleep disorder 07/06/2017  . Obstructive sleep apnea 07/06/2017  . History of hypoglycemia 07/03/2017  . Ganglion cyst of foot 06/17/2017  . History of asthma 05/19/2017  . Chronic migraine without aura without status migrainosus, not intractable 12/31/2016  . Other complicated headache syndrome 12/09/2016  . Anxiety 04/13/2015  Past Medical History:  Diagnosis Date  . Allergy   . Anxiety   . Asthma     Past Surgical History:  Procedure Laterality Date  . SKIN BIOPSY  10/07/2018   right 5th digit.   . TONSILLECTOMY      Social History   Socioeconomic History  . Marital status: Single    Spouse name: Not on file  . Number of children: 0  . Years of education: Not on file  . Highest education level: Associate degree: academic program  Occupational History    Employer: [employer]   Tobacco Use  . Smoking status: Never Smoker  . Smokeless tobacco: Never Used  Vaping Use  . Vaping Use: Never used  Substance and Sexual Activity  . Alcohol use: No    Alcohol/week: 0.0 standard drinks    Comment: rarely  . Drug use: No  . Sexual activity: Not Currently  Other Topics Concern  . 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 Thyroid disease Mother   . Chiari malformation Mother   . Hyperlipidemia Father   . Hypertension Father   . Diabetes Father   . Rheum arthritis Father   . Diabetes Maternal Grandmother   . Migraines Maternal Grandmother   . Rheum arthritis Maternal Grandmother   . Stroke Maternal Grandfather   . Hypertension Maternal Grandfather   . Stroke Paternal Grandfather   . Healthy Sister   . Healthy Brother      Review of Systems  Constitutional: Negative.  Negative for chills and fever.  HENT: Negative.  Negative for congestion and sore throat.   Respiratory: Negative.  Negative for cough and shortness of breath.   Cardiovascular: Negative.  Negative for chest pain and palpitations.  Gastrointestinal: Negative.  Negative for abdominal pain, diarrhea, nausea and vomiting.  Genitourinary: Negative.  Negative for dysuria and hematuria.  Musculoskeletal: Negative.  Negative for back pain, joint pain, myalgias and neck pain.  Skin: Positive for itching and rash.       Occasional flareups of hand eczema  Neurological: Negative.  Negative for dizziness and headaches.  All other systems reviewed and are  negative.    Physical Exam Vitals reviewed.  Constitutional:      Appearance: Normal appearance.  HENT:     Head: Normocephalic.  Eyes:     Extraocular Movements: Extraocular movements intact.     Pupils: Pupils are equal, round, and reactive to light.  Cardiovascular:     Rate and Rhythm: Normal rate.  Pulmonary:     Effort: Pulmonary effort is normal.  Musculoskeletal:        General: Normal range of motion.     Cervical back: Normal range of motion.  Skin:    General: Skin is warm and dry.     Capillary Refill: Capillary refill takes less than 2 seconds.  Neurological:     General: No focal deficit present.     Mental Status:  He is alert and oriented to person, place, and time.  Psychiatric:        Mood and Affect: Mood normal.        Behavior: Behavior normal.      ASSESSMENT & PLAN: FMLA paperwork filled out with patient's assistance. Brandn was seen today for exzema.  Diagnoses and all orders for this visit:  Eczema of both hands  Psoriasis    Patient Instructions       If you have lab work done today you will be contacted with your lab results within the next 2 weeks.  If you have not heard from [place] then please contact us. The fastest way to get your results is to register for My Chart.   IF you received an x-ray today, you will receive an invoice from [facility]. Please contact [facility] at [phone] with questions or concerns regarding your invoice.   IF you received labwork today, you will receive an invoice from [facility]. Please contact [facility] at [phone] with questions or concerns regarding your invoice.   Our billing staff will not be able to assist you with questions regarding bills from these companies.  You will be contacted with the lab results as soon as they are available. The fastest way to get your results is to activate your My Chart account. Instructions are located on the last page of this paperwork. If  you have not heard from [place] regarding the results in 2 weeks, please contact this office.     Health Maintenance, Male Adopting a healthy lifestyle and getting preventive care are important in promoting health and wellness. Ask your health care provider about:  The right schedule for you to have regular tests and exams.  Things you can do on your own to prevent diseases and keep yourself healthy. What should I know about diet, weight, and exercise? Eat a healthy diet  Eat a diet that includes plenty of vegetables, fruits, low-fat dairy products, and lean protein.  Do not eat a lot of foods that are high in solid fats, added sugars, or sodium.   Maintain a healthy weight Body mass index (BMI) is a measurement that can be used to identify possible weight problems. It estimates body fat based on height and weight. Your health care provider can help determine your BMI and help you achieve or maintain a healthy weight. Get regular exercise Get regular exercise. This is one of the most important things you can do for your health. Most adults should:  Exercise for at least 150 minutes each week. The exercise should increase your heart rate and make you sweat (moderate-intensity exercise).  Do strengthening exercises at least twice a week. This is in addition to the moderate-intensity exercise.  Spend less time sitting. Even light physical activity can be beneficial. Watch cholesterol and blood lipids Have your blood tested for lipids and cholesterol at 26 years of age, then have this test every 5 years. You may need to have your cholesterol levels checked more often if:  Your lipid or cholesterol levels are high.  You are older than 26 years of age.  You are at high risk for heart disease. What should I know about cancer screening? Many types of cancers can be detected early and may often be prevented. Depending on your health history and family history, you may need to have cancer  screening at various ages. This may include screening for:  Colorectal cancer.  Prostate cancer.  Skin cancer.  Lung  cancer. What should I know about heart disease, diabetes, and high blood pressure? Blood pressure and heart disease  High blood pressure causes heart disease and increases the risk of stroke. This is more likely to develop in people who have high blood pressure readings, are of African descent, or are overweight.  Talk with your health care provider about your target blood pressure readings.  Have your blood pressure checked: ? Every 3-5 years if you are 66-23 years of age. ? Every year if you are 32 years old or older.  If you are between the ages of 16 and 31 and are a current or former smoker, ask your health care provider if you should have a one-time screening for abdominal aortic aneurysm (AAA). Diabetes Have regular diabetes screenings. This checks your fasting blood sugar level. Have the screening done:  Once every three years after age 56 if you are at a normal weight and have a low risk for diabetes.  More often and at a younger age if you are overweight or have a high risk for diabetes. What should I know about preventing infection? Hepatitis B If you have a higher risk for hepatitis B, you should be screened for this virus. Talk with your health care provider to find out if you are at risk for hepatitis B infection. Hepatitis C Blood testing is recommended for:  Everyone born from [birth year] through 1966.  Anyone with known risk factors for hepatitis C. Sexually transmitted infections (STIs)  You should be screened each year for STIs, including gonorrhea and chlamydia, if: ? You are sexually active and are younger than 26 years of age. ? You are older than 26 years of age and your health care provider tells you that you are at risk for this type of infection. ? Your sexual activity has changed since you were last screened, and you are at increased risk  for chlamydia or gonorrhea. Ask your health care provider if you are at risk.  Ask your health care provider about whether you are at high risk for HIV. Your health care provider may recommend a prescription medicine to help prevent HIV infection. If you choose to take medicine to prevent HIV, you should first get tested for HIV. You should then be tested every 3 months for as long as you are taking the medicine. Follow these instructions at home: Lifestyle  Do not use any products that contain nicotine or tobacco, such as cigarettes, e-cigarettes, and chewing tobacco. If you need help quitting, ask your health care provider.  Do not use street drugs.  Do not share needles.  Ask your health care provider for help if you need support or information about quitting drugs. Alcohol use  Do not drink alcohol if your health care provider tells you not to drink.  If you drink alcohol: ? Limit how much you have to 0-2 drinks a day. ? Be aware of how much alcohol is in your drink. In the [place] one drink equals one 12 oz bottle of beer (355 mL), one 5 oz glass of wine (148 mL), or one 1 oz glass of hard liquor (44 mL). General instructions  Schedule regular health, dental, and eye exams.  Stay current with your vaccines.  Tell your health care provider if: ? You often feel depressed. ? You have ever been abused or do not feel safe at home. Summary  Adopting a healthy lifestyle and getting preventive care are important in promoting health and wellness.  Follow  your health care provider's instructions about healthy diet, exercising, and getting tested or screened for diseases.  Follow your health care provider's instructions on monitoring your cholesterol and blood pressure. This information is not intended to replace advice given to you by your health care provider. Make sure you discuss any questions you have with your health care provider. Document Revised: 12/30/2017 Document Reviewed:  12/30/2017 Elsevier Patient Education  2021 Elsevier Inc.      Agustina Caroli, MD [facility]

## 2020-03-13 NOTE — Telephone Encounter (Signed)
Did you receive this fax?

## 2020-03-13 NOTE — Telephone Encounter (Signed)
Pt got a call from [employer] and they told him----the paperwork was missing the date. [employer] has faxed  back paperwork this past Friday 03/09/20. Pt is calling checking on status of this message. Please advise

## 2020-03-15 NOTE — Telephone Encounter (Signed)
Aram Beecham dated on providers signature line and re faxed copy provided for records

## 2020-03-19 ENCOUNTER — Encounter: Payer: Self-pay | Admitting: Emergency Medicine

## 2020-03-19 DIAGNOSIS — Z889 Allergy status to unspecified drugs, medicaments and biological substances status: Secondary | ICD-10-CM

## 2020-03-19 DIAGNOSIS — Z9189 Other specified personal risk factors, not elsewhere classified: Secondary | ICD-10-CM

## 2020-03-20 NOTE — Telephone Encounter (Signed)
Yes it is acceptable.  Thanks.

## 2020-03-20 NOTE — Telephone Encounter (Signed)
Pt requesting a referral to the allergist, last OV 03/08/2020 for eczema is this acceptable? Order pended

## 2020-03-27 ENCOUNTER — Encounter (HOSPITAL_COMMUNITY): Payer: Self-pay | Admitting: Emergency Medicine

## 2020-03-27 ENCOUNTER — Emergency Department (HOSPITAL_COMMUNITY)
Admission: EM | Admit: 2020-03-27 | Discharge: 2020-03-27 | Disposition: A | Discharge status: Home / Self Care | Payer: PRIVATE HEALTH INSURANCE | Attending: Emergency Medicine | Admitting: Emergency Medicine

## 2020-03-27 ENCOUNTER — Other Ambulatory Visit: Payer: Self-pay

## 2020-03-27 ENCOUNTER — Emergency Department (HOSPITAL_COMMUNITY): Payer: PRIVATE HEALTH INSURANCE

## 2020-03-27 DIAGNOSIS — J45909 Unspecified asthma, uncomplicated: Secondary | ICD-10-CM | POA: Insufficient documentation

## 2020-03-27 DIAGNOSIS — Z79899 Other long term (current) drug therapy: Secondary | ICD-10-CM | POA: Insufficient documentation

## 2020-03-27 DIAGNOSIS — S0083XA Contusion of other part of head, initial encounter: Secondary | ICD-10-CM | POA: Diagnosis not present

## 2020-03-27 DIAGNOSIS — S0993XA Unspecified injury of face, initial encounter: Secondary | ICD-10-CM | POA: Diagnosis present

## 2020-03-27 DIAGNOSIS — Z91010 Allergy to peanuts: Secondary | ICD-10-CM | POA: Diagnosis not present

## 2020-03-27 DIAGNOSIS — H5712 Ocular pain, left eye: Secondary | ICD-10-CM | POA: Insufficient documentation

## 2020-03-27 MED ORDER — ACETAMINOPHEN 500 MG PO TABS
1000.0000 mg | ORAL_TABLET | Freq: Once | ORAL | Status: AC
  Administered 2020-03-27: 1000 mg via ORAL
  Filled 2020-03-27: qty 2
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
## 2020-03-28 ENCOUNTER — Other Ambulatory Visit (HOSPITAL_COMMUNITY): Payer: Self-pay | Admitting: Dentistry

## 2020-04-10 ENCOUNTER — Ambulatory Visit (INDEPENDENT_AMBULATORY_CARE_PROVIDER_SITE_OTHER): Payer: No Typology Code available for payment source | Admitting: Allergy & Immunology

## 2020-04-10 ENCOUNTER — Encounter: Payer: Self-pay | Admitting: Allergy & Immunology

## 2020-04-10 ENCOUNTER — Other Ambulatory Visit: Payer: Self-pay

## 2020-04-10 VITALS — BP 118/78 | HR 88 | Temp 97.3°F | Resp 18 | Ht 70.0 in | Wt 283.6 lb

## 2020-04-10 DIAGNOSIS — J4520 Mild intermittent asthma, uncomplicated: Secondary | ICD-10-CM | POA: Diagnosis not present

## 2020-04-10 DIAGNOSIS — J310 Chronic rhinitis: Secondary | ICD-10-CM | POA: Diagnosis not present

## 2020-04-10 DIAGNOSIS — L409 Psoriasis, unspecified: Secondary | ICD-10-CM

## 2020-04-10 NOTE — Patient Instructions (Addendum)
1. Mild intermittent asthma, uncomplicated - Lung testing looks great today. - We are not going to make any medication changes. - Continue with albuterol two puffs every 4-6 hours as needed (can increase to 4 puffs on particularly bad days).   2. Chronic rhinitis - Histamine was non reactive. - We will get some blood work instead. - We may have to have you come back for skin testing if the blood work in not revealing. - Typically results come back in 1 week, but it might take longer. - Allergy shot information provided and consent signed.  - Continue with: Zyrtec (cetirizine) 10mg  tablet once daily and Flonase (fluticasone) two sprays per nostril daily - Start taking: Astelin (azelastine) 2 sprays per nostril 1-2 times daily as needed - You can use an extra dose of the antihistamine (Zyrtec), if needed, for breakthrough symptoms.  - Consider nasal saline rinses 1-2 times daily to remove allergens from the nasal cavities as well as help with mucous clearance (this is especially helpful to do before the nasal sprays are given) - We will plan to do allergy shots as a means of long-term control. - Allergy shots "re-train" and "reset" the immune system to ignore environmental allergens and decrease the resulting immune response to those allergens (sneezing, itchy watery eyes, runny nose, nasal congestion, etc).    - Allergy shots improve symptoms in 75-85% of patients.  - Schedule to start shots in four weeks to start shots (we should have everything in place).  3. Return in about 2 weeks (around 04/24/2020) for SKIN TESTING (WE MAY CANCEL THIS DEPENDING ON THE LABS).    Please inform 06/24/2020 of any Emergency Department visits, hospitalizations, or changes in symptoms. Call us before going to the ED for breathing or allergy symptoms since we might be able to fit you in for a sick visit. Feel free to contact us anytime with any questions, problems, or concerns.  It was a pleasure to meet you  today!  Websites that have reliable patient information: 1. American Academy of Asthma, Allergy, and Immunology: www.aaaai.org 2. Food Allergy Research and Education (FARE): foodallergy.org 3. Mothers of Asthmatics: http://www.asthmacommunitynetwork.org 4. American College of Allergy, Asthma, and Immunology: www.acaai.org   COVID-19 Vaccine Information can be found at: [place] For questions related to vaccine distribution or appointments, please email [email or web address] or call [phone].   We realize that you might be concerned about having an allergic reaction to the COVID19 vaccines. To help with that concern, WE ARE OFFERING THE COVID19 VACCINES IN OUR OFFICE! Ask the front desk for dates!     "Like" [phone] on Facebook and Instagram for our latest updates!      A healthy democracy works best when [place] participate! Make sure you are registered to vote! If you have moved or changed any of your contact information, you will need to get this updated before voting!  In some cases, you MAY be able to register to vote online: [employer]    Allergy Shots   Allergies are the result of a chain reaction that starts in the immune system. Your immune system controls how your body defends itself. For instance, if you have an allergy to pollen, your immune system identifies pollen as an invader or allergen. Your immune system overreacts by producing antibodies called Immunoglobulin E (IgE). These antibodies travel to cells that release chemicals, causing an allergic reaction.  The concept behind allergy immunotherapy, whether it is received in the form of shots or tablets, is that  the immune system can be desensitized to specific allergens that trigger allergy symptoms. Although it requires time and patience, the payback can be long-term relief.  How Do Allergy Shots  Work?  Allergy shots work much like a vaccine. Your body responds to injected amounts of a particular allergen given in increasing doses, eventually developing a resistance and tolerance to it. Allergy shots can lead to decreased, minimal or no allergy symptoms.  There generally are two phases: build-up and maintenance. Build-up often ranges from three to six months and involves receiving injections with increasing amounts of the allergens. The shots are typically given once or twice a week, though more rapid build-up schedules are sometimes used.  The maintenance phase begins when the most effective dose is reached. This dose is different for each person, depending on how allergic you are and your response to the build-up injections. Once the maintenance dose is reached, there are longer periods between injections, typically two to four weeks.  Occasionally doctors give cortisone-type shots that can temporarily reduce allergy symptoms. These types of shots are different and should not be confused with allergy immunotherapy shots.  Who Can Be Treated with Allergy Shots?  Allergy shots may be a good treatment approach for people with allergic rhinitis (hay fever), allergic asthma, conjunctivitis (eye allergy) or stinging insect allergy.   Before deciding to begin allergy shots, you should consider:  . The length of allergy season and the severity of your symptoms . Whether medications and/or changes to your environment can control your symptoms . Your desire to avoid long-term medication use . Time: allergy immunotherapy requires a major time commitment . Cost: may vary depending on your insurance coverage  Allergy shots for children age 69 and older are effective and often well tolerated. They might prevent the onset of new allergen sensitivities or the progression to asthma.  Allergy shots are not started on patients who are pregnant but can be continued on patients who become pregnant  while receiving them. In some patients with other medical conditions or who take certain common medications, allergy shots may be of risk. It is important to mention other medications you talk to your allergist.   When Will I Feel Better?  Some may experience decreased allergy symptoms during the build-up phase. For others, it may take as long as 12 months on the maintenance dose. If there is no improvement after a year of maintenance, your allergist will discuss other treatment options with you.  If you aren't responding to allergy shots, it may be because there is not enough dose of the allergen in your vaccine or there are missing allergens that were not identified during your allergy testing. Other reasons could be that there are high levels of the allergen in your environment or major exposure to non-allergic triggers like tobacco smoke.  What Is the Length of Treatment?  Once the maintenance dose is reached, allergy shots are generally continued for three to five years. The decision to stop should be discussed with your allergist at that time. Some people may experience a permanent reduction of allergy symptoms. Others may relapse and a longer course of allergy shots can be considered.  What Are the Possible Reactions?  The two types of adverse reactions that can occur with allergy shots are local and systemic. Common local reactions include very mild redness and swelling at the injection site, which can happen immediately or several hours after. A systemic reaction, which is less common, affects the entire body or  a particular body system. They are usually mild and typically respond quickly to medications. Signs include increased allergy symptoms such as sneezing, a stuffy nose or hives.  Rarely, a serious systemic reaction called anaphylaxis can develop. Symptoms include swelling in the throat, wheezing, a feeling of tightness in the chest, nausea or dizziness. Most serious systemic reactions  develop within 30 minutes of allergy shots. This is why it is strongly recommended you wait in your doctor's office for 30 minutes after your injections. Your allergist is trained to watch for reactions, and his or her staff is trained and equipped with the proper medications to identify and treat them.  Who Should Administer Allergy Shots?  The preferred location for receiving shots is your prescribing allergist's office. Injections can sometimes be given at another facility where the physician and staff are trained to recognize and treat reactions, and have received instructions by your prescribing allergist.

## 2020-04-10 NOTE — Progress Notes (Signed)
NEW PATIENT  Date of Service/Encounter:  04/10/20  Referring provider: Horald Pollen, MD   Assessment:   Mild intermittent asthma, uncomplicated  Chronic rhinitis - getting blood work today  Psoriasis   Plan/Recommendations:   1. Mild intermittent asthma, uncomplicated - Lung testing looks great today. - We are not going to make any medication changes. - Continue with albuterol two puffs every 4-6 hours as needed (can increase to 4 puffs on particularly bad days).   2. Chronic rhinitis - Histamine was non reactive. - We will get some blood work instead. - We may have to have you come back for skin testing if the blood work in not revealing. - Typically results come back in 1 week, but it might take longer. - Allergy shot information provided and consent signed.  - Continue with: Zyrtec (cetirizine) 67m tablet once daily and Flonase (fluticasone) two sprays per nostril daily - Start taking: Astelin (azelastine) 2 sprays per nostril 1-2 times daily as needed - You can use an extra dose of the antihistamine (Zyrtec), if needed, for breakthrough symptoms.  - Consider nasal saline rinses 1-2 times daily to remove allergens from the nasal cavities as well as help with mucous clearance (this is especially helpful to do before the nasal sprays are given) - We will plan to do allergy shots as a means of long-term control. - Allergy shots "re-train" and "reset" the immune system to ignore environmental allergens and decrease the resulting immune response to those allergens (sneezing, itchy watery eyes, runny nose, nasal congestion, etc).    - Allergy shots improve symptoms in 75-85% of patients.  - Schedule to start shots in four weeks to start shots (we should have everything in place).  3. Return in about 2 weeks (around 04/24/2020) for SKIN TESTING (WE MAY CANCEL THIS DEPENDING ON THE LABS).   Subjective:   Michael Lam a 25y.o. male presenting today for  evaluation of  Chief Complaint  Patient presents with  . Allergic Rhinitis     Sometimes has to take 2-3 zyrtec a day - sneezing, congestion, watery itchy eyes, tight throat  Cats and dogs also worsen symptoms     Michael Lam a history of the following: Patient Active Problem List   Diagnosis Date Noted  . Rheumatoid arthritis of multiple sites with negative rheumatoid factor ([place] 11/23/2018  . Family history of rheumatoid arthritis 11/23/2018  . Chronic nonintractable headache 02/02/2018  . Chronic allergic rhinitis 11/24/2017  . Shift work sleep disorder 07/06/2017  . Obstructive sleep apnea 07/06/2017  . History of hypoglycemia 07/03/2017  . Ganglion cyst of foot 06/17/2017  . History of asthma 05/19/2017  . Chronic migraine without aura without status migrainosus, not intractable 12/31/2016  . Other complicated headache syndrome 12/09/2016  . Anxiety 04/13/2015    History obtained from: chart review and patient.  AAntinio Lam referred by SHorald Pollen MD.     [name] a 25y.o. male presenting for an evaluation of possible allergies. He reports that he has had allergic rhinitis since he was a child. It has gotten a lot worse since getting older. Cats and dogs are particularly bad for him. Even in general, he will have sniffling and throat clearing all of the time. He has sneezing and congestion. He does report that he has a tight throat.  He works as RTherapist, sportsin the ED.    Asthma/Respiratory Symptom History: He does have a history of asthma. He takes  albuterol. He goes through an inhaler one per year or maybe 2-3 total per year. allergies are his biggest trigger. He has not been to to the ED for his breathing and has not needed any steroids for his breathing.   Allergic Rhinitis Symptom History: He is currently on OTC cetirizine for his allergies. There are times when he has to take two per day. He did take Singulair at night in the  past. He never really noticed a diffference without it. He is on Flonase. He has not tried any other nose sprays at all.   Food Allergy Symptom History: He did have testing done back in [place] and it was positive to egg, peanut, corn. He eats all of those without a problem. He does fine with peanut butter and jelly. But if he eats a lot of them, he has breakouts with hives.  Psoriasis Symptom History: He apparently has a history of psoriasis. This is only on his hands. He has swelling and cracking open and bleeding. He also has a diagnosis of seronegative RA. He sees Dr. Estanislado Pandy. He does have betamethasone to use for this when it happens. He does show me some pictures that show some peeling pon his finger tips.   Otherwise, there is no history of other atopic diseases, including drug allergies, stinging insect allergies or contact dermatitis. There is no significant infectious history. Vaccinations are up to date.    Past Medical History: Patient Active Problem List   Diagnosis Date Noted  . Rheumatoid arthritis of multiple sites with negative rheumatoid factor ([place]) 11/23/2018  . Family history of rheumatoid arthritis 11/23/2018  . Chronic nonintractable headache 02/02/2018  . Chronic allergic rhinitis 11/24/2017  . Shift work sleep disorder 07/06/2017  . Obstructive sleep apnea 07/06/2017  . History of hypoglycemia 07/03/2017  . Ganglion cyst of foot 06/17/2017  . History of asthma 05/19/2017  . Chronic migraine without aura without status migrainosus, not intractable 12/31/2016  . Other complicated headache syndrome 12/09/2016  . Anxiety 04/13/2015    Medication List:  Allergies as of 04/10/2020      Reactions   Methotrexate Hives   Itchy rash on upper body and face after 1 dose   Methotrexate Derivatives Hives   Itchy rash on upper body and face after 1 dose   Peanut Oil Rash   Peanut-containing Drug Products Rash   Albumen, Egg Other (See Comments)   Other reaction(s):  Abdominal Pain   Eggs Or Egg-derived Products    Other reaction(s): Abdominal Pain   Hydrocodone-acetaminophen Rash   Rash on thigh and upper body with itching   Plaquenil [hydroxychloroquine] Rash   Over the body with itching      Medication List       Accurate as of April 10, 2020 12:36 PM. If you have any questions, ask your nurse or doctor.        albuterol 108 (90 Base) MCG/ACT inhaler Commonly known as: VENTOLIN HFA Inhale 2 puffs into the lungs every 6 (six) hours as needed for wheezing or shortness of breath.   azelastine 0.1 % nasal spray Commonly known as: ASTELIN Place 2 sprays into both nostrils 2 (two) times daily. Use in each nostril as directed   betamethasone dipropionate 0.05 % ointment Commonly known as: DIPROLENE Apply topically at bedtime.   blood glucose meter kit and supplies Kit Dispense based on patient and insurance preference. Use up to four times daily as directed. (FOR ICD-9 250.00, 250.01).   cetirizine 10  MG tablet Commonly known as: ZYRTEC Take 1 tablet (10 mg total) by mouth daily for 7 days.   fluticasone 50 MCG/ACT nasal spray Commonly known as: FLONASE Place 2 sprays into both nostrils daily.   montelukast 10 MG tablet Commonly known as: SINGULAIR Take 1 tablet (10 mg total) by mouth at bedtime.   ondansetron 4 MG disintegrating tablet Commonly known as: Zofran ODT Take 1 tablet (4 mg total) by mouth every 8 (eight) hours as needed for nausea or vomiting.   polyethylene glycol 17 g packet Commonly known as: MIRALAX / GLYCOLAX Take 17 g by mouth daily.   Qvar RediHaler 40 MCG/ACT inhaler Generic drug: beclomethasone Inhale 1 puff into the lungs 2 (two) times daily.   zolpidem 5 MG tablet Commonly known as: AMBIEN Take 1 tablet (5 mg total) by mouth at bedtime as needed for sleep.       Birth History: non-contributory  Developmental History: non-contributory  Past Surgical History: Past Surgical History:  Procedure  Laterality Date  . ADENOIDECTOMY    . SKIN BIOPSY  10/07/2018   right 5th digit.   . TONSILLECTOMY       Family History: Family History  Problem Relation Age of Onset  . Thyroid disease Mother   . Chiari malformation Mother   . Hyperlipidemia Father   . Hypertension Father   . Diabetes Father   . Rheum arthritis Father   . Diabetes Maternal Grandmother   . Migraines Maternal Grandmother   . Rheum arthritis Maternal Grandmother   . Stroke Maternal Grandfather   . Hypertension Maternal Grandfather   . Stroke Paternal Grandfather   . Healthy Sister   . Healthy Brother      Social History: Shakai lives at home with his family. He works in the ED for the past four years. He trained in [place] and he ended up leaving before [employer]. He could not find a place to work there he lived with a family of five. He has not been back since 2019. He is looking into going somewhere warmer. There are no animals inside of the home. He is not a smoker.   Review of Systems  Constitutional: Negative.  Negative for chills, fever, malaise/fatigue and weight loss.  HENT: Positive for congestion and sinus pain. Negative for ear discharge and ear pain.   Eyes: Negative for pain, discharge and redness.  Respiratory: Positive for cough. Negative for sputum production, shortness of breath and wheezing.   Cardiovascular: Negative.  Negative for chest pain and palpitations.  Gastrointestinal: Negative for abdominal pain, constipation, diarrhea, heartburn, nausea and vomiting.  Skin: Positive for rash. Negative for itching.  Neurological: Negative for dizziness and headaches.  Endo/Heme/Allergies: Positive for environmental allergies. Does not bruise/bleed easily.       Objective:   Blood pressure 118/78, pulse 88, temperature (!) 97.3 F (36.3 C), resp. rate 18, height _0  (1.778 m), weight 283 lb 9.6 oz (128.6 kg), SpO2 96 %. Body mass index is 40.69 kg/m.   Physical  Exam:   Physical Exam Constitutional:      Appearance: He is well-developed.  HENT:     Head: Normocephalic and atraumatic.     Right Ear: Tympanic membrane, ear canal and external ear normal. No drainage, swelling or tenderness. Tympanic membrane is not injected, scarred, erythematous, retracted or bulging.     Left Ear: Tympanic membrane, ear canal and external ear normal. No drainage, swelling or tenderness. Tympanic membrane is not injected, scarred, erythematous,  retracted or bulging.     Nose: No nasal deformity, septal deviation, mucosal edema or rhinorrhea.     Right Turbinates: Enlarged, swollen and pale.     Left Turbinates: Enlarged, swollen and pale.     Right Sinus: No maxillary sinus tenderness or frontal sinus tenderness.     Left Sinus: No maxillary sinus tenderness or frontal sinus tenderness.     Comments: No polyps noted. No epistaxis present.    Mouth/Throat:     Mouth: Mucous membranes are not pale and not dry.     Pharynx: Uvula midline.  Eyes:     General: Allergic shiner present.        Right eye: No discharge.        Left eye: No discharge.     Conjunctiva/sclera: Conjunctivae normal.     Right eye: Right conjunctiva is not injected. No chemosis.    Left eye: Left conjunctiva is not injected. No chemosis.    Pupils: Pupils are equal, round, and reactive to light.  Cardiovascular:     Rate and Rhythm: Normal rate and regular rhythm.     Heart sounds: Normal heart sounds.  Pulmonary:     Effort: Pulmonary effort is normal. No tachypnea, accessory muscle usage or respiratory distress.     Breath sounds: Normal breath sounds. No wheezing, rhonchi or rales.     Comments: Moving air well in all lung fields. No increased work of breathing noted.  Chest:     Chest wall: No tenderness.  Abdominal:     Tenderness: There is no abdominal tenderness. There is no guarding or rebound.  Lymphadenopathy:     Head:     Right side of head: No submandibular, tonsillar or  occipital adenopathy.     Left side of head: No submandibular, tonsillar or occipital adenopathy.     Cervical: No cervical adenopathy.  Skin:    General: Skin is warm.     Capillary Refill: Capillary refill takes less than 2 seconds.     Coloration: Skin is not pale.     Findings: No abrasion, erythema, petechiae or rash. Rash is not papular, urticarial or vesicular.     Comments: Hands are clear today. He does show me pictures of some peeling fingers and cracking ton the hands.  Neurological:     Mental Status: He is alert.      Diagnostic studies:    Spirometry: results normal (FEV1: 4.20/93%, FVC: 5.32/99%, FEV1/FVC: 79%).    Spirometry consistent with normal pattern.    Allergy Studies: labs sent instead          Salvatore Marvel, MD [facility]

## 2020-04-14 LAB — ALLERGENS W/COMP RFLX AREA 2
Alternaria Alternata IgE: 0.29 kU/L — AB
Aspergillus Fumigatus IgE: 0.6 kU/L — AB
Bermuda Grass IgE: 0.46 kU/L — AB
Cedar, Mountain IgE: 0.12 kU/L — AB
Cladosporium Herbarum IgE: 0.87 kU/L — AB
Cockroach, German IgE: <0.1 kU/L
Common Silver Birch IgE: 0.96 kU/L — AB
Cottonwood IgE: <0.1 kU/L
D Farinae IgE: 3.75 kU/L — AB
D Pteronyssinus IgE: 7.2 kU/L — AB
E001-IgE Cat Dander: 4.24 kU/L — AB
E005-IgE Dog Dander: 4.13 kU/L — AB
Elm, American IgE: 0.47 kU/L — AB
IgE (Immunoglobulin E), Serum: 373 IU/mL (ref 6–495)
Johnson Grass IgE: 0.73 kU/L — AB
Maple/Box Elder IgE: <0.1 kU/L
Mouse Urine IgE: <0.1 kU/L
Oak, White IgE: 1.71 kU/L — AB
Pecan, Hickory IgE: 0.13 kU/L — AB
Penicillium Chrysogen IgE: 0.25 kU/L — AB
Pigweed, Rough IgE: 0.17 kU/L — AB
Ragweed, Short IgE: 0.18 kU/L — AB
Sheep Sorrel IgE Qn: 0.3 kU/L — AB
Timothy Grass IgE: 2.13 kU/L — AB
White Mulberry IgE: <0.1 kU/L

## 2020-04-14 LAB — PANEL 606578
E094-IgE Fel d 1: 6.54 kU/L — AB
E220-IgE Fel d 2: <0.1 kU/L
E228-IgE Fel d 4: <0.1 kU/L

## 2020-04-14 LAB — PANEL 606648
E101-IgE Can f 1: 2.4 kU/L — AB
E102-IgE Can f 2: <0.1 kU/L
E221-IgE Can f 3: <0.1 kU/L
E226-IgE Can f 5: 0.31 kU/L — AB

## 2020-04-14 LAB — ALLERGEN COMPONENT COMMENTS

## 2020-04-17 NOTE — Addendum Note (Signed)
Addended by: Alfonse Spruce on: 04/17/2020 05:53 PM   Modules accepted: Orders

## 2020-04-18 DIAGNOSIS — J3081 Allergic rhinitis due to animal (cat) (dog) hair and dander: Secondary | ICD-10-CM

## 2020-04-18 NOTE — Progress Notes (Signed)
Aeroallergen Immunotherapy    Patient Details  Name: Lucan Riner  MRN: [id number]  Date of Birth: 1995/05/17   Order 1 of 2   Vial Label: G/W/T/C/D   0.3 ml (Volume) BAU Concentration -- 7 Grass Mix* 100,000 ([address] [place], [place], [place], Perennial [place], Red[place], Sweet Vernal, [place])  0.3 ml (Volume) BAU Concentration -- [place] 10,000  0.2 ml (Volume) 1:20 Concentration -- [place]  0.5 ml (Volume) 1:20 Concentration -- Weed Mix*  0.5 ml (Volume) 1:20 Concentration -- Eastern 10 Tree Mix (also Sweet Gum)  0.2 ml (Volume) 1:10 Concentration -- Cedar, red  0.2 ml (Volume) 1:10 Concentration -- Pecan Pollen  0.5 ml (Volume) 1:10 Concentration -- Cat Hair  0.5 ml (Volume) 1:10 Concentration -- Dog Epithelia    3.2 ml Extract Subtotal  1.8 ml Diluent  5.0 ml Maintenance Total    Final Concentration above is stated in weight/volume (wt/vol). Allergen units (AU/ml) biological units (BAU/ml). The total volume is 5 ml.    Schedule: A   Special Instructions: none

## 2020-04-18 NOTE — Progress Notes (Signed)
Aeroallergen Immunotherapy    Patient Details  Name: Miloh Alcocer  MRN: [id number]  Date of Birth: 1995-09-20   Order 2 of 2   Vial Label: RW/DM/Molds   0.3 ml (Volume) 1:20 Concentration -- Ragweed Mix  0.2 ml (Volume) 1:20 Concentration -- Alternaria alternata  0.2 ml (Volume) 1:20 Concentration -- Cladosporium herbarum  0.2 ml (Volume) 1:10 Concentration -- Aspergillus mix  0.2 ml (Volume) 1:10 Concentration -- Penicillium mix  0.7 ml (Volume)  AU Concentration -- Mite Mix (DF 5,000 & DP 5,000)    1.8 ml Extract Subtotal  3.2 ml Diluent  5.0 ml Maintenance Total    Final Concentration above is stated in weight/volume (wt/vol). Allergen units (AU/ml) biological units (BAU/ml). The total volume is 5 ml.    Schedule: A   Special Instructions: none

## 2020-04-18 NOTE — Progress Notes (Signed)
VIALS EXP 04-18-21 

## 2020-04-19 DIAGNOSIS — J3089 Other allergic rhinitis: Secondary | ICD-10-CM

## 2020-04-24 ENCOUNTER — Ambulatory Visit: Payer: No Typology Code available for payment source | Admitting: Allergy & Immunology

## 2020-05-01 ENCOUNTER — Ambulatory Visit: Payer: No Typology Code available for payment source

## 2020-06-07 ENCOUNTER — Ambulatory Visit: Payer: Self-pay | Admitting: Emergency Medicine

## 2020-07-10 ENCOUNTER — Other Ambulatory Visit: Payer: Self-pay

## 2020-07-10 ENCOUNTER — Ambulatory Visit: Payer: Self-pay

## 2020-07-10 ENCOUNTER — Encounter: Payer: Self-pay | Admitting: Allergy & Immunology

## 2020-07-10 ENCOUNTER — Ambulatory Visit (INDEPENDENT_AMBULATORY_CARE_PROVIDER_SITE_OTHER): Payer: No Typology Code available for payment source | Admitting: Allergy & Immunology

## 2020-07-10 ENCOUNTER — Other Ambulatory Visit (HOSPITAL_COMMUNITY): Payer: Self-pay

## 2020-07-10 VITALS — BP 120/74 | HR 87 | Temp 97.7°F | Resp 18 | Ht 70.0 in | Wt 234.6 lb

## 2020-07-10 DIAGNOSIS — Z8709 Personal history of other diseases of the respiratory system: Secondary | ICD-10-CM

## 2020-07-10 DIAGNOSIS — J309 Allergic rhinitis, unspecified: Secondary | ICD-10-CM | POA: Diagnosis not present

## 2020-07-10 DIAGNOSIS — J4520 Mild intermittent asthma, uncomplicated: Secondary | ICD-10-CM

## 2020-07-10 MED ORDER — AZELASTINE HCL 0.1 % NA SOLN
NASAL | 12 refills | Status: DC
  Filled 2020-07-10: qty 30, 25d supply, fill #0

## 2020-07-10 MED ORDER — PIMECROLIMUS 1 % EX CREA
TOPICAL_CREAM | Freq: Two times a day (BID) | CUTANEOUS | 3 refills | Status: DC
  Filled 2020-07-10: qty 30, 14d supply, fill #0

## 2020-07-10 MED ORDER — CETIRIZINE HCL 10 MG PO TABS
10.0000 mg | ORAL_TABLET | Freq: Every day | ORAL | 5 refills | Status: DC
Start: 2020-07-10 — End: 2021-09-20
  Filled 2020-07-10: qty 30, 30d supply, fill #0

## 2020-07-10 MED ORDER — ALBUTEROL SULFATE HFA 108 (90 BASE) MCG/ACT IN AERS
INHALATION_SPRAY | RESPIRATORY_TRACT | 1 refills | Status: DC
  Filled 2020-07-10: qty 18, 17d supply, fill #0

## 2020-07-10 NOTE — Progress Notes (Signed)
FOLLOW UP  Date of Service/Encounter:  07/10/20   Assessment:    Mild intermittent asthma, uncomplicated   Perennial and seasonal allergic rhinitis (grasses, trees, weeds, ragweed, indoor and outdoor molds, dust mite, cat, dog) - started immunotherapy today   Psoriasis versus eczema on hands   Michael Lam continues to have issues with his uncontrolled allergic rhinitis.  Unfortunately, he missed his start injection appointment in April and never called to reschedule that.  We did start his allergy shots today and hopefully this will get off to a good start.  Asthma is under good control.  He has the hand dermatitis which is continued unabated.  He has been on betamethasone as well as hydrocortisone and triamcinolone in the past.  He has not tried any calcineurin inhibitors, so we are adding on Elidel twice daily as needed.  He is going to give [place] an update in 1 to 2 weeks to let us know how that is going.  If this is not controlling his symptoms, we will submit for Dupixent.  Plan/Recommendations:   1. Mild intermittent asthma, uncomplicated - Lung testing looks great today. - We are not going to make any medication changes. - Continue with albuterol two puffs every 4-6 hours as needed (can increase to 4 puffs on particularly bad days).   2. Perennial and seasonal allergic rhinitis - Allergy shots started today. - Come back next week for your next shot.  - Continue with: Zyrtec (cetirizine) 10mg  tablet once daily and Flonase (fluticasone) two sprays per nostril daily - Continue taking: Astelin (azelastine) 2 sprays per nostril 1-2 times daily as needed  3. Hand dermatitis - Start Elidel twice daily as needed to the rash. - MyChart to let us know if this is working in two weeks. - Dupixent information provided.   4. Return in about 3 months (around 10/10/2020).   Subjective:   Michael Lam is a 25 y.o. male presenting today for follow up of  Chief Complaint   Patient presents with   Asthma    ACT - 18    Allergic Rhinitis     Uses the zyrtec and still has runny nose, and congestions. The shots have not been helping. His eyes were swollen shut, his throat felt like it was closing. He was given benadryl and it helped a little. Possible flare due to pollen.     Michael Lam has a history of the following: Patient Active Problem List   Diagnosis Date Noted   Rheumatoid arthritis of multiple sites with negative rheumatoid factor ([facility]) 11/23/2018   Family history of rheumatoid arthritis 11/23/2018   Chronic nonintractable headache 02/02/2018   Chronic allergic rhinitis 11/24/2017   Shift work sleep disorder 07/06/2017   Obstructive sleep apnea 07/06/2017   History of hypoglycemia 07/03/2017   Ganglion cyst of foot 06/17/2017   History of asthma 05/19/2017   Chronic migraine without aura without status migrainosus, not intractable 12/31/2016   Other complicated headache syndrome 12/09/2016   Anxiety 04/13/2015    History obtained from: chart review and patient.  Michael Lam is a 25 y.o. male presenting for a follow up visit.  He was last seen in March 2022.  At that time, would continue with Zyrtec daily as well as Flonase 2 sprays per nostril daily.  We added on Astelin 2 sprays per nostril up to twice daily.  He was on antihistamines, so we got blood work.  Blood work was notable for positive IgE to nearly the entire panel.  Since the last visit, he has felt fairly terrible.  Review of his chart shows that he missed his  start injection appointment in April.  He never rescheduled it.  Asthma/Respiratory Symptom History: Asthma has been under good control. He has not had as bad of symptoms as his allergies.  He is using albuterol as needed. He has Qvar but he has not been using it at all.   Allergic Rhinitis Symptom History: Allergies were out of control a few weeks ago and he had to start using his inhaler. His eyes were swollen  shut and his voice was high pitched. Benadryl improved the symptoms.  He is taking two cetirizine   Eczema Symptom History: He has betamethasone.  He has been on hydrocortisone as well as triamcinolone. He has been to [facility]. He had a biopsy but he never found out the results.  He does not think that he has been on [place] or calcineurin inhibitor such as Elidel or Protopic.  He is open to trying Dupixent.  His eczema on his hand does bother him in the middle of the night.  He thinks that he does not get good sleep because of it.  It is often cracked and bleeding.  Otherwise, there have been no changes to his past medical history, surgical history, family history, or social history.    Review of Systems  Constitutional: Negative.  Negative for chills, fever, malaise/fatigue and weight loss.  HENT:  Positive for congestion and sinus pain. Negative for ear discharge and ear pain.   Eyes:  Negative for pain, discharge and redness.  Respiratory:  Negative for cough, sputum production, shortness of breath and wheezing.   Cardiovascular: Negative.  Negative for chest pain and palpitations.  Gastrointestinal:  Negative for abdominal pain, constipation, diarrhea, heartburn, nausea and vomiting.  Skin:  Positive for itching and rash.  Neurological:  Negative for dizziness and headaches.  Endo/Heme/Allergies:  Positive for environmental allergies. Does not bruise/bleed easily.      Objective:   Blood pressure 120/74, pulse 87, temperature 97.7 F (36.5 C), resp. rate 18, height 5\' 10"  (1.778 m), weight 234 lb 9.6 oz (106.4 kg), SpO2 97 %. Body mass index is 33.66 kg/m.   Physical Exam:  Physical Exam Constitutional:      Appearance: He is well-developed.  HENT:     Head: Normocephalic and atraumatic.     Right Ear: Tympanic membrane, ear canal and external ear normal.     Left Ear: Tympanic membrane, ear canal and external ear normal.     Nose: No nasal deformity, septal  deviation, mucosal edema or rhinorrhea.     Right Turbinates: Enlarged and swollen.     Left Turbinates: Enlarged and swollen.     Right Sinus: No maxillary sinus tenderness or frontal sinus tenderness.     Left Sinus: No maxillary sinus tenderness or frontal sinus tenderness.     Comments: No nasal polyps.    Mouth/Throat:     Mouth: Mucous membranes are not pale and not dry.     Pharynx: Uvula midline.  Eyes:     General: Lids are normal. No allergic shiner.       Right eye: No discharge.        Left eye: No discharge.     Conjunctiva/sclera: Conjunctivae normal.     Right eye: Right conjunctiva is not injected. No chemosis.    Left eye: Left conjunctiva is not injected. No chemosis.    Pupils: Pupils are  equal, round, and reactive to light.  Cardiovascular:     Rate and Rhythm: Normal rate and regular rhythm.     Heart sounds: Normal heart sounds.  Pulmonary:     Effort: Pulmonary effort is normal. No tachypnea, accessory muscle usage or respiratory distress.     Breath sounds: Normal breath sounds. No wheezing, rhonchi or rales.     Comments: Moving air well in all lung fields.  No increased work of breathing. Chest:     Chest wall: No tenderness.  Lymphadenopathy:     Cervical: No cervical adenopathy.  Skin:    General: Skin is warm.     Capillary Refill: Capillary refill takes less than 2 seconds.     Coloration: Skin is not pale.     Findings: Rash present. No abrasion, erythema or petechiae. Rash is not papular, urticarial or vesicular.     Comments: Cracked ichthyotic skin on the bilateral hands.  There is no oozing or honey crusting today.  Neurological:     Mental Status: He is alert.  Psychiatric:        Behavior: Behavior is cooperative.     Diagnostic studies:    Spirometry: results normal (FEV1: 4.68/100%, FVC: 5.75/104%, FEV1/FVC: 81%).    Spirometry consistent with normal pattern. Xopenex four puffs via MDI treatment given in clinic with no  improvement.  Allergy Studies: none         Malachi Bonds, MD  [facility]

## 2020-07-10 NOTE — Patient Instructions (Signed)
1. Mild intermittent asthma, uncomplicated - Lung testing looks great today. - We are not going to make any medication changes. - Continue with albuterol two puffs every 4-6 hours as needed (can increase to 4 puffs on particularly bad days).   2. Perennial and seasonal allergic rhinitis - Allergy shots started today. - Come back next week for your next shot.  - Continue with: Zyrtec (cetirizine) 10mg  tablet once daily and Flonase (fluticasone) two sprays per nostril daily - Continue taking: Astelin (azelastine) 2 sprays per nostril 1-2 times daily as needed  3. Hand dermatitis - Start Elidel twice daily as needed to the rash. - MyChart to let us know if this is working in two weeks. - Dupixent information provided.   4. Return in about 3 months (around 10/10/2020).

## 2020-07-11 ENCOUNTER — Other Ambulatory Visit (HOSPITAL_COMMUNITY): Payer: Self-pay

## 2020-07-16 ENCOUNTER — Other Ambulatory Visit (HOSPITAL_COMMUNITY): Payer: Self-pay

## 2020-10-11 ENCOUNTER — Ambulatory Visit: Payer: No Typology Code available for payment source | Admitting: Allergy & Immunology

## 2020-10-11 DIAGNOSIS — J309 Allergic rhinitis, unspecified: Secondary | ICD-10-CM

## 2020-10-15 ENCOUNTER — Other Ambulatory Visit (HOSPITAL_COMMUNITY): Payer: Self-pay

## 2020-10-15 ENCOUNTER — Encounter: Payer: Self-pay | Admitting: Emergency Medicine

## 2020-10-15 MED ORDER — INFLUENZA VAC SPLIT QUAD 0.5 ML IM SUSY
0.5000 mL | PREFILLED_SYRINGE | INTRAMUSCULAR | 0 refills | Status: DC
  Filled 2020-10-15: qty 0.5, 1d supply, fill #0

## 2020-10-15 MED ORDER — PHENTERMINE HCL 37.5 MG PO TABS
37.5000 mg | ORAL_TABLET | Freq: Every day | ORAL | 0 refills | Status: DC
  Filled 2020-10-15: qty 60, 60d supply, fill #0

## 2020-10-15 NOTE — Telephone Encounter (Signed)
Sent pt a message through my chart to inform pt that he needs an OV.

## 2021-09-04 ENCOUNTER — Other Ambulatory Visit: Payer: Self-pay

## 2021-09-04 ENCOUNTER — Emergency Department (HOSPITAL_COMMUNITY)
Admission: EM | Admit: 2021-09-04 | Discharge: 2021-09-05 | Disposition: A | Discharge status: Home / Self Care | Payer: No Typology Code available for payment source | Attending: Emergency Medicine | Admitting: Emergency Medicine

## 2021-09-04 DIAGNOSIS — J45909 Unspecified asthma, uncomplicated: Secondary | ICD-10-CM | POA: Diagnosis not present

## 2021-09-04 DIAGNOSIS — Z20822 Contact with and (suspected) exposure to covid-19: Secondary | ICD-10-CM | POA: Insufficient documentation

## 2021-09-04 DIAGNOSIS — Z91010 Allergy to peanuts: Secondary | ICD-10-CM | POA: Diagnosis not present

## 2021-09-04 DIAGNOSIS — R519 Headache, unspecified: Secondary | ICD-10-CM | POA: Insufficient documentation

## 2021-09-04 DIAGNOSIS — M542 Cervicalgia: Secondary | ICD-10-CM | POA: Diagnosis present

## 2021-09-04 DIAGNOSIS — M549 Dorsalgia, unspecified: Secondary | ICD-10-CM | POA: Diagnosis not present

## 2021-09-04 DIAGNOSIS — X501XXA Overexertion from prolonged static or awkward postures, initial encounter: Secondary | ICD-10-CM | POA: Insufficient documentation

## 2021-09-04 DIAGNOSIS — Z7951 Long term (current) use of inhaled steroids: Secondary | ICD-10-CM | POA: Diagnosis not present

## 2021-09-04 DIAGNOSIS — R000 Tachycardia, unspecified: Secondary | ICD-10-CM | POA: Diagnosis not present

## 2021-09-04 DIAGNOSIS — H5710 Ocular pain, unspecified eye: Secondary | ICD-10-CM | POA: Insufficient documentation

## 2021-09-04 MED ORDER — METHOCARBAMOL 500 MG PO TABS
1000.0000 mg | ORAL_TABLET | Freq: Once | ORAL | Status: AC
  Administered 2021-09-05: 1000 mg via ORAL
  Filled 2021-09-04: qty 2

## 2021-09-04 MED ORDER — LIDOCAINE 5 % EX PTCH
1.0000 | MEDICATED_PATCH | CUTANEOUS | Status: DC
  Administered 2021-09-05: 1 via TRANSDERMAL
  Filled 2021-09-04: qty 1

## 2021-09-04 MED ORDER — LACTATED RINGERS IV BOLUS
1000.0000 mL | Freq: Once | INTRAVENOUS | Status: AC
  Administered 2021-09-05: 1000 mL via INTRAVENOUS

## 2021-09-04 MED ORDER — KETOROLAC TROMETHAMINE 15 MG/ML IJ SOLN
15.0000 mg | Freq: Once | INTRAMUSCULAR | Status: AC
  Administered 2021-09-05: 15 mg via INTRAVENOUS
  Filled 2021-09-04: qty 1

## 2021-09-04 MED ORDER — KETOROLAC TROMETHAMINE 60 MG/2ML IM SOLN: 30.0000 mg | Freq: Once | INTRAMUSCULAR | Status: DC

## 2021-09-04 MED ORDER — OXYCODONE-ACETAMINOPHEN 5-325 MG PO TABS
1.0000 | ORAL_TABLET | Freq: Once | ORAL | Status: AC
  Administered 2021-09-05: 1 via ORAL
  Filled 2021-09-04: qty 1

## 2021-09-04 NOTE — ED Triage Notes (Signed)
Pt had luggage dropped on him and turned to catch it. Now is experiencing back pain radiating up neck. Concerned "stiff neck." Pt decreased ambulation in triage. Inabilty it sit back d/t pain

## 2021-09-04 NOTE — ED Provider Notes (Signed)
[facility] Provider Note   CSN: [id number] Arrival date & time: 09/04/21  2256     History {Add pertinent medical, surgical, social history, OB history to HPI:1} Chief Complaint  Patient presents with   Back Pain    Michael Lam is a 26 y.o. male.   Back Pain Patient presents for back pain.  Medical history includes anxiety, migraines, OSA, and asthma.     Home Medications Prior to Admission medications   Medication Sig Start Date End Date Taking? Authorizing Provider  albuterol (VENTOLIN HFA) 108 (90 Base) MCG/ACT inhaler Inhale two puffs every 4-6 hours as needed 07/10/20   Valentina Shaggy, MD  azelastine (ASTELIN) 0.1 % nasal spray PLACE 2 SPRAYS INTO BOTH NOSTRILS TWICE DAILY AS DIRECTED. 07/10/20 07/10/21  Valentina Shaggy, MD  beclomethasone (QVAR REDIHALER) 40 MCG/ACT inhaler Inhale 1 puff into the lungs 2 (two) times daily. 02/20/20   Horald Pollen, MD  blood glucose meter kit and supplies KIT Dispense based on patient and insurance preference. Use up to four times daily as directed. (FOR ICD-9 250.00, 250.01). 07/03/17   Horald Pollen, MD  cetirizine (ZYRTEC) 10 MG tablet Take 1 tablet (10 mg total) by mouth daily for 7 days. 05/24/18 11/23/18  Horald Pollen, MD  cetirizine (ZYRTEC) 10 MG tablet Take 1 tablet (10 mg total) by mouth daily. 07/10/20   Valentina Shaggy, MD  fluticasone [facility]) 50 MCG/ACT nasal spray Place 2 sprays into both nostrils daily. 05/24/18   Horald Pollen, MD  fluticasone [facility] HFA) 110 MCG/ACT inhaler INHALE 1 PUFF INTO THE LUNGS TWICE DAILY. 02/29/20 02/28/21  Horald Pollen, MD  influenza vac split quadrivalent PF (FLUARIX) 0.5 ML injection Inject 0.5 mLs into the muscle. 10/15/20   Carlyle Basques, MD  montelukast (SINGULAIR) 10 MG tablet TAKE 1 TABLET (10 MG TOTAL) BY MOUTH AT BEDTIME. 02/20/20 02/19/21  Horald Pollen, MD  phentermine  (ADIPEX-P) 37.5 MG tablet Take 1 tablet (37.5 mg total) by mouth daily. 10/15/20     pimecrolimus (ELIDEL) 1 % cream Apply topically 2 (two) times daily. 07/10/20   Valentina Shaggy, MD  polyethylene glycol (MIRALAX / GLYCOLAX) 17 g packet Take 17 g by mouth daily. 08/22/19   Mesner, Corene Cornea, MD  zolpidem (AMBIEN) 5 MG tablet TAKE 1 TABLET (5 MG TOTAL) BY MOUTH AT BEDTIME AS NEEDED FOR SLEEP. 02/20/20 08/18/20  Horald Pollen, MD      Allergies    Methotrexate, Methotrexate derivatives, Peanut oil, Peanut-containing drug products, Egg white (egg protein), Eggs or egg-derived products, Hydrocodone-acetaminophen, and Plaquenil [hydroxychloroquine]    Review of Systems   Review of Systems  Musculoskeletal:  Positive for back pain.    Physical Exam Updated Vital Signs BP (!) 146/91   Pulse (!) 113   Temp 98.6 F (37 C) (Oral)   Resp 16   SpO2 98%  Physical Exam  ED Results / Procedures / Treatments   Labs (all labs ordered are listed, but only abnormal results are displayed) Labs Reviewed - No data to display  EKG None  Radiology No results found.  Procedures Procedures  {Document cardiac monitor, telemetry assessment procedure when appropriate:1}  Medications Ordered in ED Medications - No data to display  ED Course/ Medical Decision Making/ A&P                           Medical Decision Making  ***  {  Document critical care time when appropriate:1} {Document review of labs and clinical decision tools ie heart score, Chads2Vasc2 etc:1}  {Document your independent review of radiology images, and any outside records:1} {Document your discussion with family members, caretakers, and with consultants:1} {Document social determinants of health affecting pt's care:1} {Document your decision making why or why not admission, treatments were needed:1} Final Clinical Impression(s) / ED Diagnoses Final diagnoses:  None    Rx / DC Orders ED Discharge Orders     None

## 2021-09-05 ENCOUNTER — Other Ambulatory Visit (HOSPITAL_COMMUNITY): Payer: Self-pay

## 2021-09-05 ENCOUNTER — Emergency Department (HOSPITAL_COMMUNITY): Payer: No Typology Code available for payment source

## 2021-09-05 LAB — CBC
HCT: 43.5 % (ref 39.0–52.0)
Hemoglobin: 14.7 g/dL (ref 13.0–17.0)
MCH: 28.2 pg (ref 26.0–34.0)
MCHC: 33.8 g/dL (ref 30.0–36.0)
MCV: 83.3 fL (ref 80.0–100.0)
Platelets: 159 10*3/uL (ref 150–400)
RBC: 5.22 MIL/uL (ref 4.22–5.81)
RDW: 12.3 % (ref 11.5–15.5)
WBC: 5 10*3/uL (ref 4.0–10.5)
nRBC: 0 % (ref 0.0–0.2)

## 2021-09-05 LAB — BASIC METABOLIC PANEL
Anion gap: 9 (ref 5–15)
BUN: 10 mg/dL (ref 6–20)
CO2: 21 mmol/L — ABNORMAL LOW (ref 22–32)
Calcium: 9.2 mg/dL (ref 8.9–10.3)
Chloride: 107 mmol/L (ref 98–111)
Creatinine, Ser: 0.88 mg/dL (ref 0.61–1.24)
GFR, Estimated: >60 mL/min (ref >60)
Glucose, Bld: 97 mg/dL (ref 70–99)
Potassium: 3.9 mmol/L (ref 3.5–5.1)
Sodium: 137 mmol/L (ref 135–145)

## 2021-09-05 LAB — RESP PANEL BY RT-PCR (FLU A&B, COVID) ARPGX2
Influenza A by PCR: NEGATIVE
Influenza B by PCR: NEGATIVE
SARS Coronavirus 2 by RT PCR: NEGATIVE

## 2021-09-05 LAB — MAGNESIUM: Magnesium: 2 mg/dL (ref 1.7–2.4)

## 2021-09-05 MED ORDER — METHOCARBAMOL 500 MG PO TABS
500.0000 mg | ORAL_TABLET | Freq: Two times a day (BID) | ORAL | 0 refills | Status: AC
  Filled 2021-09-05: qty 14, 7d supply, fill #0

## 2021-09-05 MED ORDER — IOHEXOL 350 MG/ML SOLN
100.0000 mL | Freq: Once | INTRAVENOUS | Status: AC | PRN
  Administered 2021-09-05: 75 mL via INTRAVENOUS

## 2021-09-05 NOTE — ED Notes (Signed)
Patient verbalizes understanding of discharge instructions. Opportunity for questioning and answers were provided. Armband removed by staff, pt discharged from ED.  

## 2021-09-05 NOTE — ED Provider Notes (Signed)
Received at shift change from Dr. 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  He states he initially had pain in his right lower back but as the day went on the pain started shoot up to his neck  and now has severe pain that goes from bottom pf his back to the top with a headache.  He denies any paresthesias or weakness in his upper or lower extremities no urinary or bowel incontinence he is.  Per previous provider follow-up on CT imaging if unremarkable patient can be discharged home.   Physical Exam  BP 120/84   Pulse 91   Temp 98.6 F (37 C) (Oral)   Resp 18   SpO2 96%   Physical Exam Vitals and nursing note reviewed.  Constitutional:      General: He is not in acute distress.    Appearance: He is not ill-appearing.  HENT:     Head: Normocephalic and atraumatic.     Nose: No congestion.  Eyes:     Conjunctiva/sclera: Conjunctivae normal.  Cardiovascular:     Rate and Rhythm: Normal rate and regular rhythm.     Pulses: Normal pulses.     Heart sounds:     No friction rub.  Pulmonary:     Effort: Pulmonary effort is normal.  Skin:    General: Skin is warm and dry.  Neurological:     Mental Status: He is alert.  Psychiatric:        Mood and Affect: Mood normal.     Procedures  Procedures  ED Course / MDM    Medical Decision Making Amount and/or Complexity of Data Reviewed Labs: ordered. Radiology: ordered.  Risk Prescription drug management.     Co morbidities that complicate the patient evaluation  N/A  Social Determinants of Health:  N/A    Lab Tests:  I Ordered, and personally interpreted labs.  The pertinent results include: CBC unremarkable, BMP shows CO2 of 21, mag 2, respiratory panel negative   Imaging Studies ordered:  I ordered  imaging studies including CT angio I independently visualized and interpreted imaging which showed all negative acute findings I agree with the radiologist interpretation   Cardiac Monitoring:  The patient was maintained on a cardiac monitor.  I personally viewed and interpreted the cardiac monitored which showed an underlying rhythm of: N/A   Medicines ordered and prescription drug management:  I ordered medication including N/A I have reviewed the patients home medicines and have made adjustments as needed  Critical Interventions:  N/A   Reevaluation: Updated the patient on imaging states he is feeling much better and is in agreement with plan discharge at this time.  Consultations Obtained:  N/A   Test Considered:  N/A    Rule out I have low suspicion for spinal fracture or spinal cord abnormality as patient denies urinary incontinency, retention, difficulty with bowel movements, denies saddle paresthesias.  Spine was palpated there is no step-off, crepitus or gross deformities felt, patient had 5/5 strength, full range of motion, neurovascular fully intact in the upper and lower extremities, imaging was negative.  Low suspicion for dissection as CT imaging is negative.  Low suspicion for intracranial  CVA no for deficit present my exam.   Dispostion and problem list  After consideration of the diagnostic results and the patients response to treatment, I feel that the patent would benefit from discharge.  Back pain-muscular in nature, will provide him with a muscle relaxer, follow-up with PCP for further evaluation and strict return precautions.           Carroll Sage, PA-C 09/05/21 0151    Gloris Manchester, MD 09/05/21 [phone]

## 2021-09-05 NOTE — Discharge Instructions (Signed)
You have been seen here for back pain I recommend taking over-the-counter pain medications like ibuprofen and/or Tylenol every 6 as needed.  Please follow dosage and on the back of bottle.  I also recommend applying heat to the area and stretching out the muscles as this will help decrease stiffness and pain.  I have given you information on exercises please follow.  If symptoms not improve after a week's time please follow-up with your PCP.  Come back to the emergency department if you develop chest pain, shortness of breath, severe abdominal pain, uncontrolled nausea, vomiting, diarrhea.

## 2021-09-17 ENCOUNTER — Encounter (HOSPITAL_BASED_OUTPATIENT_CLINIC_OR_DEPARTMENT_OTHER): Payer: Self-pay | Admitting: Emergency Medicine

## 2021-09-17 ENCOUNTER — Other Ambulatory Visit: Payer: Self-pay

## 2021-09-17 ENCOUNTER — Emergency Department (HOSPITAL_BASED_OUTPATIENT_CLINIC_OR_DEPARTMENT_OTHER)
Admission: EM | Admit: 2021-09-17 | Discharge: 2021-09-18 | Disposition: A | Discharge status: Home / Self Care | Payer: No Typology Code available for payment source | Attending: Emergency Medicine | Admitting: Emergency Medicine

## 2021-09-17 DIAGNOSIS — Z79899 Other long term (current) drug therapy: Secondary | ICD-10-CM | POA: Insufficient documentation

## 2021-09-17 DIAGNOSIS — Z20822 Contact with and (suspected) exposure to covid-19: Secondary | ICD-10-CM | POA: Insufficient documentation

## 2021-09-17 DIAGNOSIS — R509 Fever, unspecified: Secondary | ICD-10-CM | POA: Diagnosis not present

## 2021-09-17 DIAGNOSIS — E860 Dehydration: Secondary | ICD-10-CM | POA: Insufficient documentation

## 2021-09-17 DIAGNOSIS — L0501 Pilonidal cyst with abscess: Secondary | ICD-10-CM | POA: Diagnosis present

## 2021-09-17 DIAGNOSIS — R748 Abnormal levels of other serum enzymes: Secondary | ICD-10-CM

## 2021-09-17 LAB — COMPREHENSIVE METABOLIC PANEL
ALT: 126 U/L — ABNORMAL HIGH (ref 0–44)
AST: 73 U/L — ABNORMAL HIGH (ref 15–41)
Albumin: 3.7 g/dL (ref 3.5–5.0)
Alkaline Phosphatase: 131 U/L — ABNORMAL HIGH (ref 38–126)
Anion gap: 7 (ref 5–15)
BUN: 10 mg/dL (ref 6–20)
CO2: 22 mmol/L (ref 22–32)
Calcium: 8 mg/dL — ABNORMAL LOW (ref 8.9–10.3)
Chloride: 103 mmol/L (ref 98–111)
Creatinine, Ser: 1.03 mg/dL (ref 0.61–1.24)
GFR, Estimated: >60 mL/min (ref >60)
Glucose, Bld: 99 mg/dL (ref 70–99)
Potassium: 3.3 mmol/L — ABNORMAL LOW (ref 3.5–5.1)
Sodium: 132 mmol/L — ABNORMAL LOW (ref 135–145)
Total Bilirubin: 0.9 mg/dL (ref 0.3–1.2)
Total Protein: 6.9 g/dL (ref 6.5–8.1)

## 2021-09-17 LAB — LACTIC ACID, PLASMA: Lactic Acid, Venous: 0.9 mmol/L (ref 0.5–1.9)

## 2021-09-17 LAB — SARS CORONAVIRUS 2 BY RT PCR: SARS Coronavirus 2 by RT PCR: NEGATIVE

## 2021-09-17 MED ORDER — ACETAMINOPHEN 500 MG PO TABS: 1000.0000 mg | ORAL_TABLET | Freq: Once | ORAL | Status: DC

## 2021-09-17 MED ORDER — LACTATED RINGERS IV BOLUS
1000.0000 mL | Freq: Once | INTRAVENOUS | Status: AC
  Administered 2021-09-18: 1000 mL via INTRAVENOUS

## 2021-09-17 MED ORDER — ACETAMINOPHEN 325 MG PO TABS
650.0000 mg | ORAL_TABLET | Freq: Once | ORAL | Status: AC | PRN
  Administered 2021-09-17: 650 mg via ORAL
  Filled 2021-09-17: qty 2

## 2021-09-17 NOTE — ED Provider Notes (Signed)
[facility]  Provider Note  CSN: [id number] Arrival date & time: 09/17/21 2110  History Chief Complaint  Patient presents with   Back Pain   Fever    Michael Lam is a 26 y.o. 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 He was seen at [facility] where he was worked up including CTA head and neck but no imaging of his lower back. He was given Robaxin but stopped taking it due to side effects. Pain had initially gotten better but began to worse in the last couple of days, now radiating down his R leg. Associated with chills, but he did not know he was running a fever until arrival. He also has a pilonidal cyst which has been bothering him some. No drainage. He has not had any cough, congestion, N/V/D or dysuria. No prior history of IVDA.    Home Medications Prior to Admission medications   Medication Sig Start Date End Date Taking? Authorizing Provider  doxycycline (VIBRAMYCIN) 100 MG capsule Take 1 capsule (100 mg total) by mouth 2 (two) times daily. 09/18/21  Yes Truddie Hidden, MD  Acetaminophen (TYLENOL 8 HOUR PO) Take 1 tablet by mouth every 8 (eight) hours as needed (pain).    [provider]  albuterol (VENTOLIN HFA) 108 (90 Base) MCG/ACT inhaler Inhale two puffs every 4-6 hours as needed Patient not taking: Reported on 09/05/2021 07/10/20   Valentina Shaggy, MD  azelastine (ASTELIN) 0.1 % nasal spray PLACE 2 SPRAYS INTO BOTH NOSTRILS TWICE DAILY AS DIRECTED. Patient not taking: Reported on 09/05/2021 07/10/20 07/10/21  Valentina Shaggy, MD  beclomethasone (QVAR REDIHALER) 40 MCG/ACT inhaler Inhale 1 puff into the lungs 2 (two) times daily. Patient not taking: Reported on 09/05/2021 02/20/20   Horald Pollen, MD  blood glucose meter kit and supplies  KIT Dispense based on patient and insurance preference. Use up to four times daily as directed. (FOR ICD-9 250.00, 250.01). 07/03/17   Horald Pollen, MD  cetirizine (ZYRTEC) 10 MG tablet Take 1 tablet (10 mg total) by mouth daily. Patient not taking: Reported on 09/05/2021 07/10/20   Valentina Shaggy, MD  fluticasone [facility]) 50 MCG/ACT nasal spray Place 2 sprays into both nostrils daily. Patient not taking: Reported on 09/05/2021 05/24/18   Horald Pollen, MD  fluticasone [facility] HFA) 110 MCG/ACT inhaler INHALE 1 PUFF INTO THE LUNGS TWICE DAILY. 02/29/20 02/28/21  Horald Pollen, MD  influenza vac split quadrivalent PF (FLUARIX) 0.5 ML injection Inject 0.5 mLs into the muscle. 10/15/20   Carlyle Basques, MD  montelukast (SINGULAIR) 10 MG tablet TAKE 1 TABLET (10 MG TOTAL) BY MOUTH AT BEDTIME. Patient not taking: Reported on 09/05/2021 02/20/20 02/19/21  Horald Pollen, MD  phentermine (ADIPEX-P) 37.5 MG tablet Take 1 tablet (37.5 mg total) by mouth daily. Patient not taking: Reported on 09/05/2021 10/15/20     pimecrolimus (ELIDEL) 1 % cream Apply topically 2 (two) times daily. Patient not taking: Reported on 09/05/2021 07/10/20   Valentina Shaggy, MD  polyethylene glycol (MIRALAX / GLYCOLAX) 17 g packet Take 17 g by mouth daily. Patient not taking: Reported on 09/05/2021 08/22/19   Mesner, Corene Cornea, MD  zolpidem (AMBIEN) 5 MG tablet TAKE 1 TABLET (5 MG TOTAL) BY MOUTH AT BEDTIME AS NEEDED FOR SLEEP. Patient  not taking: Reported on 09/05/2021 02/20/20 08/18/20  Horald Pollen, MD     Allergies    Methotrexate, Methotrexate derivatives, Peanut oil, Peanut-containing drug products, Egg white (egg protein), Eggs or egg-derived products, Hydrocodone-acetaminophen, and Plaquenil [hydroxychloroquine]   Review of Systems   Review of Systems Please see HPI for pertinent positives and negatives  Physical Exam BP (!) 102/52 (BP Location: Right Arm)   Pulse 97   Temp 98.1 F  (36.7 C) (Oral)   Resp 18   Ht '5\' 11"'  (1.803 m)   Wt 102.1 kg   SpO2 95%   BMI 31.38 kg/m   Physical Exam Vitals and nursing note reviewed.  Constitutional:      Appearance: Normal appearance.  HENT:     Head: Normocephalic and atraumatic.     Nose: Nose normal.     Mouth/Throat:     Mouth: Mucous membranes are moist.  Eyes:     Extraocular Movements: Extraocular movements intact.     Conjunctiva/sclera: Conjunctivae normal.  Cardiovascular:     Rate and Rhythm: Tachycardia present.  Pulmonary:     Effort: Pulmonary effort is normal.     Breath sounds: Normal breath sounds.  Abdominal:     General: Abdomen is flat.     Palpations: Abdomen is soft.     Tenderness: There is no abdominal tenderness.  Musculoskeletal:        General: No swelling. Normal range of motion.     Cervical back: Neck supple.     Comments: Mild tenderness over SI joints bilaterally, no CVA tenderness, no midline spine tenderness. Small pilonidal cyst without fluctuance or external signs of infection.   Skin:    General: Skin is warm and dry.  Neurological:     General: No focal deficit present.     Mental Status: He is alert.  Psychiatric:        Mood and Affect: Mood normal.     ED Results / Procedures / Treatments   EKG None  Procedures .Marland KitchenIncision and Drainage  Date/Time: 09/18/2021 3:26 AM  Performed by: Truddie Hidden, MD Authorized by: Truddie Hidden, MD   Consent:    Consent obtained:  Verbal   Consent given by:  Patient Location:    Type:  Pilonidal cyst   Size:  Small Pre-procedure details:    Skin preparation:  Povidone-iodine Sedation:    Sedation type:  None Anesthesia:    Anesthesia method:  Local infiltration   Local anesthetic:  Lidocaine 2% WITH epi Procedure type:    Complexity:  Simple Procedure details:    Incision types:  Single straight   Wound management:  Probed and deloculated   Drainage:  Bloody   Drainage amount:  Scant   Wound treatment:   Wound left open Post-procedure details:    Procedure completion:  Tolerated well, no immediate complications Comments:     Incision made over the most tender area corresponding to CT images, although no fluctuance was appreciable. Minimal drainage obtained after probing the area.    Medications Ordered in the ED Medications  lidocaine-EPINEPHrine (XYLOCAINE W/EPI) 2 %-1:200000 (PF) injection 10 mL (has no administration in time range)  doxycycline (VIBRA-TABS) tablet 100 mg (has no administration in time range)  acetaminophen (TYLENOL) tablet 650 mg (650 mg Oral Given 09/17/21 2145)  lactated ringers bolus 1,000 mL (0 mLs Intravenous Stopped 09/18/21 0058)  ketorolac (TORADOL) 30 MG/ML injection 30 mg (30 mg Intravenous Given 09/18/21 0154)  iohexol (OMNIPAQUE) 300 MG/ML  solution 125 mL (125 mLs Intravenous Contrast Given 09/18/21 0141)    Initial Impression and Plan  Patient here with back pain, ?MSK, also has a fever but no obvious source. Will check labs for source of fever. Explained that CT imaging of lumbar spine is not likely to be beneficial in this case. IVF and APAP for comfort.   ED Course   Clinical Course as of 09/18/21 0331  Wed Sep 18, 2021  0023 Covid is neg. CMP with mildly elevated LFTs of unclear significance. UA is concentrated but no signs of infection. Lactic acid is normal.  [CS]  0041 CBC is normal.  [CS]  3953 Will send for CT to evaluate abnormal liver enzymes as well as to check his lower back and the pilonidal area.  [CS]  0216 I personally viewed the images from radiology studies and agree with radiologist interpretation: CT neg for intra-abdominal process. There is a small fluid collection that corresponds to his pilonidal cyst which has some signs of inflammation. Will perform I&D to see if there is purulent material in there.   [CS]  0328 I&D performed with minimal results. Will begin Abx and recommend follow up with Gen Surg for definitive care when infection  is under control. Otherwise his fever has gone down, HR returned to normal and he is feeling better. Plan discharge home. RTED for any other concerns.  [CS]    Clinical Course User Index [CS] Truddie Hidden, MD     MDM Rules/Calculators/A&P Medical Decision Making Given presenting complaint, I considered that admission might be necessary. After review of results from ED lab and/or imaging studies, admission to the hospital is not indicated at this time.    Problems Addressed: Dehydration: acute illness or injury Elevated liver enzymes: acute illness or injury Fever, unspecified: acute illness or injury Pilonidal abscess: acute illness or injury  Amount and/or Complexity of Data Reviewed Labs: ordered. Decision-making details documented in ED Course. Radiology: ordered and independent interpretation performed. Decision-making details documented in ED Course.  Risk OTC drugs. Prescription drug management. Decision regarding hospitalization.    Final Clinical Impression(s) / ED Diagnoses Final diagnoses:  Pilonidal abscess  Fever, unspecified  Dehydration  Elevated liver enzymes    Rx / DC Orders ED Discharge Orders          Ordered    doxycycline (VIBRAMYCIN) 100 MG capsule  2 times daily        09/18/21 0330             Truddie Hidden, MD 09/18/21 [phone]

## 2021-09-17 NOTE — ED Triage Notes (Signed)
Patient arrived via POV c/o lower back pain, w/ pilonidal cyst. Patient states continued back pain with radiation to inner legs. Patient continues to have headache. Patient is AO x 4, VS w/ fever, slow gait.

## 2021-09-17 NOTE — ED Notes (Signed)
Pt endorses back pain that radiates to inner legs, has pilonidal cyst , HA and fever

## 2021-09-18 ENCOUNTER — Other Ambulatory Visit (HOSPITAL_COMMUNITY): Payer: Self-pay

## 2021-09-18 ENCOUNTER — Emergency Department (HOSPITAL_BASED_OUTPATIENT_CLINIC_OR_DEPARTMENT_OTHER): Payer: Self-pay

## 2021-09-18 LAB — CBC WITH DIFFERENTIAL/PLATELET
Basophils Absolute: 0 10*3/uL (ref 0.0–0.1)
Basophils Relative: 0 %
Eosinophils Absolute: 0 10*3/uL (ref 0.0–0.5)
Eosinophils Relative: 0 %
HCT: 39.1 % (ref 39.0–52.0)
Hemoglobin: 13.2 g/dL (ref 13.0–17.0)
Lymphocytes Relative: 60 %
Lymphs Abs: 4.8 10*3/uL — ABNORMAL HIGH (ref 0.7–4.0)
MCH: 27.7 pg (ref 26.0–34.0)
MCHC: 33.8 g/dL (ref 30.0–36.0)
MCV: 82.1 fL (ref 80.0–100.0)
Monocytes Absolute: 0.6 10*3/uL (ref 0.1–1.0)
Monocytes Relative: 7 %
Neutro Abs: 2.6 10*3/uL (ref 1.7–7.7)
Neutrophils Relative %: 32 %
Platelets: 151 10*3/uL (ref 150–400)
RBC: 4.76 MIL/uL (ref 4.22–5.81)
RDW: 12.8 % (ref 11.5–15.5)
Smear Review: NORMAL
WBC Morphology: >10
WBC: 8.1 10*3/uL (ref 4.0–10.5)
nRBC: 0 /100 WBC

## 2021-09-18 LAB — URINALYSIS, MICROSCOPIC (REFLEX)

## 2021-09-18 LAB — URINALYSIS, ROUTINE W REFLEX MICROSCOPIC
Glucose, UA: NEGATIVE mg/dL
Hgb urine dipstick: NEGATIVE
Ketones, ur: NEGATIVE mg/dL
Leukocytes,Ua: NEGATIVE
Nitrite: NEGATIVE
Protein, ur: 100 mg/dL — AB
Specific Gravity, Urine: >=1.03 (ref 1.005–1.030)
pH: 5.5 (ref 5.0–8.0)

## 2021-09-18 LAB — RESP PANEL BY RT-PCR (FLU A&B, COVID) ARPGX2
Influenza A by PCR: NEGATIVE
Influenza B by PCR: NEGATIVE
SARS Coronavirus 2 by RT PCR: NEGATIVE

## 2021-09-18 MED ORDER — LIDOCAINE-EPINEPHRINE (PF) 2 %-1:200000 IJ SOLN
10.0000 mL | Freq: Once | INTRAMUSCULAR | Status: AC
  Administered 2021-09-18: 10 mL
  Filled 2021-09-18: qty 20

## 2021-09-18 MED ORDER — KETOROLAC TROMETHAMINE 30 MG/ML IJ SOLN
30.0000 mg | Freq: Once | INTRAMUSCULAR | Status: AC
  Administered 2021-09-18: 30 mg via INTRAVENOUS
  Filled 2021-09-18: qty 1

## 2021-09-18 MED ORDER — DOXYCYCLINE HYCLATE 100 MG PO TABS
100.0000 mg | ORAL_TABLET | Freq: Once | ORAL | Status: AC
  Administered 2021-09-18: 100 mg via ORAL
  Filled 2021-09-18: qty 1

## 2021-09-18 MED ORDER — DOXYCYCLINE HYCLATE 100 MG PO CAPS
100.0000 mg | ORAL_CAPSULE | Freq: Two times a day (BID) | ORAL | 0 refills | Status: DC
  Filled 2021-09-18: qty 20, 10d supply, fill #0

## 2021-09-18 MED ORDER — IOHEXOL 300 MG/ML  SOLN
125.0000 mL | Freq: Once | INTRAMUSCULAR | Status: AC | PRN
Start: 2021-09-18 — End: 2021-09-18
  Administered 2021-09-18: 125 mL via INTRAVENOUS

## 2021-09-20 ENCOUNTER — Other Ambulatory Visit (HOSPITAL_COMMUNITY): Payer: Self-pay

## 2021-09-20 ENCOUNTER — Emergency Department (HOSPITAL_COMMUNITY): Payer: Commercial Managed Care - PPO

## 2021-09-20 ENCOUNTER — Emergency Department (HOSPITAL_COMMUNITY)
Admission: EM | Admit: 2021-09-20 | Discharge: 2021-09-20 | Disposition: A | Discharge status: Home / Self Care | Payer: Commercial Managed Care - PPO | Attending: Emergency Medicine | Admitting: Emergency Medicine

## 2021-09-20 ENCOUNTER — Encounter (HOSPITAL_COMMUNITY): Payer: Self-pay

## 2021-09-20 ENCOUNTER — Other Ambulatory Visit: Payer: Self-pay

## 2021-09-20 DIAGNOSIS — R202 Paresthesia of skin: Secondary | ICD-10-CM | POA: Diagnosis not present

## 2021-09-20 DIAGNOSIS — M545 Low back pain, unspecified: Secondary | ICD-10-CM | POA: Diagnosis not present

## 2021-09-20 DIAGNOSIS — R110 Nausea: Secondary | ICD-10-CM | POA: Diagnosis not present

## 2021-09-20 DIAGNOSIS — R509 Fever, unspecified: Secondary | ICD-10-CM

## 2021-09-20 DIAGNOSIS — L0501 Pilonidal cyst with abscess: Secondary | ICD-10-CM | POA: Insufficient documentation

## 2021-09-20 DIAGNOSIS — J45909 Unspecified asthma, uncomplicated: Secondary | ICD-10-CM | POA: Diagnosis not present

## 2021-09-20 DIAGNOSIS — R197 Diarrhea, unspecified: Secondary | ICD-10-CM | POA: Diagnosis not present

## 2021-09-20 DIAGNOSIS — M5441 Lumbago with sciatica, right side: Secondary | ICD-10-CM

## 2021-09-20 DIAGNOSIS — R791 Abnormal coagulation profile: Secondary | ICD-10-CM | POA: Diagnosis not present

## 2021-09-20 LAB — COMPREHENSIVE METABOLIC PANEL
ALT: 84 U/L — ABNORMAL HIGH (ref 0–44)
AST: 50 U/L — ABNORMAL HIGH (ref 15–41)
Albumin: 3.5 g/dL (ref 3.5–5.0)
Alkaline Phosphatase: 127 U/L — ABNORMAL HIGH (ref 38–126)
Anion gap: 10 (ref 5–15)
BUN: 10 mg/dL (ref 6–20)
CO2: 22 mmol/L (ref 22–32)
Calcium: 8.6 mg/dL — ABNORMAL LOW (ref 8.9–10.3)
Chloride: 104 mmol/L (ref 98–111)
Creatinine, Ser: 1.03 mg/dL (ref 0.61–1.24)
GFR, Estimated: >60 mL/min (ref >60)
Glucose, Bld: 104 mg/dL — ABNORMAL HIGH (ref 70–99)
Potassium: 3.7 mmol/L (ref 3.5–5.1)
Sodium: 136 mmol/L (ref 135–145)
Total Bilirubin: 0.7 mg/dL (ref 0.3–1.2)
Total Protein: 6.8 g/dL (ref 6.5–8.1)

## 2021-09-20 LAB — LACTIC ACID, PLASMA
Lactic Acid, Venous: 1.6 mmol/L (ref 0.5–1.9)
Lactic Acid, Venous: 1 mmol/L (ref 0.5–1.9)

## 2021-09-20 LAB — CBC WITH DIFFERENTIAL/PLATELET
Abs Immature Granulocytes: 0.02 10*3/uL (ref 0.00–0.07)
Basophils Absolute: 0.1 10*3/uL (ref 0.0–0.1)
Basophils Relative: 1 %
Eosinophils Absolute: 0 10*3/uL (ref 0.0–0.5)
Eosinophils Relative: 0 %
HCT: 40.6 % (ref 39.0–52.0)
Hemoglobin: 13.6 g/dL (ref 13.0–17.0)
Immature Granulocytes: 0 %
Lymphocytes Relative: 49 %
Lymphs Abs: 4.2 10*3/uL — ABNORMAL HIGH (ref 0.7–4.0)
MCH: 27.1 pg (ref 26.0–34.0)
MCHC: 33.5 g/dL (ref 30.0–36.0)
MCV: 81 fL (ref 80.0–100.0)
Monocytes Absolute: 0.6 10*3/uL (ref 0.1–1.0)
Monocytes Relative: 7 %
Neutro Abs: 3.7 10*3/uL (ref 1.7–7.7)
Neutrophils Relative %: 43 %
Platelets: 152 10*3/uL (ref 150–400)
RBC: 5.01 MIL/uL (ref 4.22–5.81)
RDW: 12.8 % (ref 11.5–15.5)
WBC: 8.6 10*3/uL (ref 4.0–10.5)
nRBC: 0 % (ref 0.0–0.2)

## 2021-09-20 LAB — URINALYSIS, ROUTINE W REFLEX MICROSCOPIC
Bilirubin Urine: NEGATIVE
Glucose, UA: NEGATIVE mg/dL
Hgb urine dipstick: NEGATIVE
Ketones, ur: 5 mg/dL — AB
Leukocytes,Ua: NEGATIVE
Nitrite: NEGATIVE
Protein, ur: NEGATIVE mg/dL
Specific Gravity, Urine: >1.046 — ABNORMAL HIGH (ref 1.005–1.030)
pH: 5 (ref 5.0–8.0)

## 2021-09-20 LAB — HIV ANTIBODY (ROUTINE TESTING W REFLEX): HIV Screen 4th Generation wRfx: NONREACTIVE

## 2021-09-20 LAB — LIPASE, BLOOD: Lipase: 43 U/L (ref 11–51)

## 2021-09-20 LAB — PROTIME-INR
INR: 1.1 (ref 0.8–1.2)
Prothrombin Time: 13.7 seconds (ref 11.4–15.2)

## 2021-09-20 MED ORDER — GADOBUTROL 1 MMOL/ML IV SOLN
10.0000 mL | Freq: Once | INTRAVENOUS | Status: AC | PRN
  Administered 2021-09-20: 10 mL via INTRAVENOUS

## 2021-09-20 MED ORDER — MORPHINE SULFATE (PF) 4 MG/ML IV SOLN
4.0000 mg | INTRAVENOUS | Status: DC | PRN
  Administered 2021-09-20: 4 mg via INTRAVENOUS
  Filled 2021-09-20: qty 1

## 2021-09-20 MED ORDER — SENNOSIDES-DOCUSATE SODIUM 8.6-50 MG PO TABS
1.0000 | ORAL_TABLET | Freq: Every evening | ORAL | 0 refills | Status: AC | PRN
  Filled 2021-09-20: qty 20, 20d supply, fill #0

## 2021-09-20 MED ORDER — LIDOCAINE-EPINEPHRINE (PF) 2 %-1:200000 IJ SOLN
10.0000 mL | Freq: Once | INTRAMUSCULAR | Status: AC
  Administered 2021-09-20: 10 mL
  Filled 2021-09-20: qty 20

## 2021-09-20 MED ORDER — ONDANSETRON HCL 4 MG/2ML IJ SOLN
4.0000 mg | Freq: Once | INTRAMUSCULAR | Status: AC
  Administered 2021-09-20: 4 mg via INTRAVENOUS
  Filled 2021-09-20: qty 2

## 2021-09-20 MED ORDER — SULFAMETHOXAZOLE-TRIMETHOPRIM 800-160 MG PO TABS
1.0000 | ORAL_TABLET | Freq: Two times a day (BID) | ORAL | 0 refills | Status: DC
  Filled 2021-09-20: qty 14, 7d supply, fill #0

## 2021-09-20 MED ORDER — OXYCODONE-ACETAMINOPHEN 5-325 MG PO TABS
1.0000 | ORAL_TABLET | Freq: Four times a day (QID) | ORAL | 0 refills | Status: DC | PRN
  Filled 2021-09-20: qty 15, 4d supply, fill #0

## 2021-09-20 MED ORDER — IBUPROFEN 400 MG PO TABS
600.0000 mg | ORAL_TABLET | Freq: Once | ORAL | Status: AC
  Administered 2021-09-20: 600 mg via ORAL
  Filled 2021-09-20: qty 1

## 2021-09-20 MED ORDER — OXYCODONE-ACETAMINOPHEN 5-325 MG PO TABS
1.0000 | ORAL_TABLET | Freq: Once | ORAL | Status: AC
  Administered 2021-09-20: 1 via ORAL
  Filled 2021-09-20: qty 1

## 2021-09-20 MED ORDER — MORPHINE SULFATE (PF) 4 MG/ML IV SOLN
4.0000 mg | Freq: Once | INTRAVENOUS | Status: AC
  Administered 2021-09-20: 4 mg via INTRAVENOUS
  Filled 2021-09-20: qty 1

## 2021-09-20 MED ORDER — LACTATED RINGERS IV BOLUS
1000.0000 mL | Freq: Once | INTRAVENOUS | Status: AC
  Administered 2021-09-20: 1000 mL via INTRAVENOUS

## 2021-09-20 MED ORDER — IOHEXOL 300 MG/ML  SOLN
100.0000 mL | Freq: Once | INTRAMUSCULAR | Status: AC | PRN
  Administered 2021-09-20: 100 mL via INTRAVENOUS

## 2021-09-20 MED ORDER — ONDANSETRON 4 MG PO TBDP
4.0000 mg | ORAL_TABLET | Freq: Three times a day (TID) | ORAL | 0 refills | Status: DC | PRN
  Filled 2021-09-20: qty 20, 7d supply, fill #0

## 2021-09-20 NOTE — Discharge Instructions (Signed)
You were seen in the emergency room today with abscess.  We are able to drain a significant amount of fluid and I am starting on different antibiotics.  The pain medication, Percocet, can cause dependence but may also cause constipation.  I called in some constipation and nausea medicine as well.  May also take ibuprofen.  If you develop worsening pain he should return to the emergency department for reevaluation.  I would like for you to follow with general surgery and return in the next 3 days for wound reevaluation and packing removal.

## 2021-09-20 NOTE — ED Notes (Signed)
Received call from lab about issue with order of labs. Labs are now in process.

## 2021-09-20 NOTE — ED Provider Notes (Signed)
Emergency Department Provider Note   I have reviewed the triage vital signs and the nursing notes.   HISTORY  Chief Complaint Fever and Back Pain   HPI Michael Lam is a 26 y.o. male with PMH of asthma presents to the ED with worsening lower back pain and fever. 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 He noted that he also had a HA. He was seen on the 17th in the ED with imaging of the head/neck but ultimately discharged. Fever began on 8/29 with a small bump in the lower spine consistent with a pilonidal cyst, which he has had before. He went to the ED for evaluation at that time with an I&D performed but minimal drainage. No packing placed. Patient was started on Doxycycline and discharged home. Labs at that time showed mildly elevated LFTs but no leukocytosis. COVID was negative.   In the last two days he has had worsening pain in the lower back and continued fever. He is feeling tingling and heaviness in the right leg. Continues to have HA similar to the time of his injury. No abdominal pain. He is having watery diarrhea and nausea. No significant URI symptoms.    Past Medical History:  Diagnosis Date   Allergy    Anxiety    Asthma    Recurrent upper respiratory infection (URI)     Review of Systems  Constitutional: Positive fever/chills ENT: No sore throat. Cardiovascular: Denies chest pain. Respiratory: Denies shortness of breath. Gastrointestinal: No abdominal pain. Positive nausea, no vomiting. Positive diarrhea.  No constipation. Genitourinary: Negative for dysuria. Musculoskeletal: Positive for back pain. Skin: Negative for rash. Neurological: Negative focal weakness or numbness. Subjective heaviness in the right leg and HA.    ____________________________________________   PHYSICAL EXAM:  VITAL SIGNS: Vitals:    09/20/21 1235 09/20/21 1240  BP:  (!) 107/59  Pulse: 92   Resp: 18   Temp:  97.9 F (36.6 C)  SpO2: 99%     Constitutional: Alert and oriented. Tearful and appears uncomfortable.  Eyes: Conjunctivae are normal.  Head: Atraumatic. Nose: No congestion/rhinnorhea. Mouth/Throat: Mucous membranes are moist.   Neck: No stridor.   Cardiovascular: Tachycardia. Good peripheral circulation. Grossly normal heart sounds.   Respiratory: Normal respiratory effort.  No retractions. Lungs CTAB. Gastrointestinal: Soft and nontender. No distention.  Musculoskeletal: No lower extremity tenderness nor edema. No gross deformities of extremities. Neurologic:  Normal speech and language. No appreciable weakness in the lower extremities.  Skin:  Skin is warm and dry. Small, healing incision overlying an area of induration to the superior sacral area. Mild erythema. No drainage. No crepitus.   ____________________________________________   LABS (all labs ordered are listed, but only abnormal results are displayed)  Labs Reviewed  URINALYSIS, ROUTINE W REFLEX MICROSCOPIC - Abnormal; Notable for the following components:      Result Value   Specific Gravity, Urine >1.046 (*)    Ketones, ur 5 (*)    All other components within normal limits  CBC WITH DIFFERENTIAL/PLATELET - Abnormal; Notable for the following components:   Lymphs Abs 4.2 (*)    All other components within normal limits  COMPREHENSIVE METABOLIC PANEL - Abnormal; Notable for the following components:   Glucose, Bld 104 (*)    Calcium 8.6 (*)    AST 50 (*)    ALT  84 (*)    Alkaline Phosphatase 127 (*)    All other components within normal limits  CULTURE, BLOOD (ROUTINE X 2)  CULTURE, BLOOD (ROUTINE X 2)  URINE CULTURE  AEROBIC/ANAEROBIC CULTURE W GRAM STAIN (SURGICAL/DEEP WOUND)  LACTIC ACID, PLASMA  LACTIC ACID, PLASMA  HIV ANTIBODY (ROUTINE TESTING W REFLEX)  LIPASE, BLOOD  PROTIME-INR  CBC WITH DIFFERENTIAL/PLATELET  ROCKY  MTN SPOTTED FVR ABS PNL(IGG+IGM)   ____________________________________________  RADIOLOGY  MR Lumbar Spine W Wo Contrast  Result Date: 09/20/2021 CLINICAL DATA:  Severe back pain radiating into both legs. Recent pilonidal cyst drainage. Infection suspected. EXAM: MRI LUMBAR SPINE WITHOUT AND WITH CONTRAST TECHNIQUE: Multiplanar and multiecho pulse sequences of the lumbar spine were obtained without and with intravenous contrast. CONTRAST:  40mL GADAVIST GADOBUTROL 1 MMOL/ML IV SOLN COMPARISON:  Abdominopelvic CT 09/18/2021.  Pelvic CT 09/20/2021. FINDINGS: Segmentation: Conventional anatomy assumed, with the last open disc space designated L5-S1.Concordant with previous imaging. Alignment:  Physiologic. Vertebrae: No worrisome osseous lesion, acute fracture or pars defect. No evidence of discitis, osteomyelitis or abnormal marrow enhancement. The visualized upper sacrum appears normal. The distal sacrum and coccyx are not imaged. The visualized sacroiliac joints appear unremarkable. Conus medullaris: Extends to the L1 level and appears normal. No abnormal intradural enhancement. Paraspinal and other soft tissues: No significant paraspinal findings. Nonspecific subcutaneous edema within the back without demonstrated focal fluid collection in the field of view. Disc levels: No significant disc space findings from T11-12 through L2-3. L3-4: Disc height and hydration are maintained. Minimal disc bulging. No spinal stenosis or nerve root encroachment. L4-5: Disc height and hydration are maintained. Minimal disc bulging. No spinal stenosis or nerve root encroachment. L5-S1: Disc height and hydration are maintained. Mild disc bulging eccentric to the left. Mild facet hypertrophy. No spinal stenosis or nerve root encroachment. IMPRESSION: 1. No evidence of discitis or osteomyelitis. The visualized sacrum appears normal. The distal sacrum and coccyx are not imaged. 2. No evidence of disc herniation, spinal stenosis  or nerve root encroachment. Mild disc bulging as described. 3. Nonspecific subcutaneous edema within the back without demonstrated focal fluid collection in the lumbar region. Electronically Signed   By: Carey Bullocks M.D.   On: 09/20/2021 09:12   CT PELVIS W CONTRAST  Result Date: 09/20/2021 CLINICAL DATA:  26 year old male with gluteal cleft fluid collection/abscess on CT Abdomen and Pelvis 2 days ago. EXAM: CT PELVIS WITH CONTRAST TECHNIQUE: Multidetector CT imaging of the pelvis was performed using the standard protocol following the bolus administration of intravenous contrast. RADIATION DOSE REDUCTION: This exam was performed according to the departmental dose-optimization program which includes automated exposure control, adjustment of the mA and/or kV according to patient size and/or use of iterative reconstruction technique. CONTRAST:  OMNIPAQUE IOHEXOL 300 MG/ML  SOLN COMPARISON:  CT Abdomen and Pelvis 09/18/2021. FINDINGS: Urinary Tract: Kidneys not included. No evidence of hydroureter. Diminutive bladder. Bowel: Anal verge appears to remain within normal limits. Rectum, sigmoid colon, visible right and left large bowel segments appears stable and within normal limits. No dilated distal small bowel. Vascular/Lymphatic: Major arterial structures in the pelvis are patent. No atherosclerosis identified. No lymphadenopathy. Reproductive:  Negative inguinal regions, scrotum. Other: Progressed inflammation in the midline superior gluteal cleft overlying the sacrococcygeal junction where an enlarging phlegmon is demonstrated, now up to 32 mm Tanner Yeley axis (coronal image 141) and 19 mm AP (versus 16-18 mm diameter 2 days ago. Confluent regional subcutaneous stranding. No soft tissue gas. Underlying sacrococcygeal junction remains  intact. No superimposed pelvic free fluid. Numerous pelvic phleboliths incidentally noted. Musculoskeletal: No acute osseous abnormality identified. IMPRESSION: 1. Progressed  inflammation and enlarging phlegmon in the midline superior gluteal cleft overlying the sacrococcygeal junction. Indistinct developing fluid collection now up to 3.2 cm Samarrah Tranchina axis compatible with developing abscess (series 5, image 83), although might not yet be drainable. No evidence of underlying osteomyelitis. No soft tissue gas. 2. No other acute or inflammatory process identified in the pelvis. Electronically Signed   By: Odessa Fleming M.D.   On: 09/20/2021 08:25    ____________________________________________   PROCEDURES  Procedure(s) performed:   Marland KitchenMarland KitchenIncision and Drainage  Date/Time: 09/20/2021 10:28 AM  Performed by: Maia Plan, MD Authorized by: Maia Plan, MD   Consent:    Consent obtained:  Verbal   Consent given by:  Patient   Risks, benefits, and alternatives were discussed: yes     Risks discussed:  Bleeding, damage to other organs, infection, incomplete drainage and pain   Alternatives discussed:  No treatment Universal protocol:    Patient identity confirmed:  Verbally with patient Location:    Type:  Pilonidal cyst   Size:  3 cm   Location:  Anogenital   Anogenital location:  Pilonidal Pre-procedure details:    Skin preparation:  Povidone-iodine Sedation:    Sedation type:  None Anesthesia:    Anesthesia method:  Local infiltration   Local anesthetic:  Lidocaine 2% WITH epi Procedure type:    Complexity:  Complex Procedure details:    Ultrasound guidance: yes     Needle aspiration: yes     Needle size:  18 G   Incision types:  Single straight   Incision depth:  Subcutaneous   Wound management:  Probed and deloculated   Drainage:  Purulent   Drainage amount:  Copious   Wound treatment:  Wound left open   Packing materials:  1/4 in iodoform gauze   Amount 1/4" iodoform:  5 cm Post-procedure details:    Procedure completion:  Tolerated well, no immediate complications    ____________________________________________   INITIAL IMPRESSION / ASSESSMENT  AND PLAN / ED COURSE  Pertinent labs & imaging results that were available during my care of the patient were reviewed by me and considered in my medical decision making (see chart for details).   This patient is Presenting for Evaluation of fever/back pain, which does require a range of treatment options, and is a complaint that involves a high risk of morbidity and mortality.  The Differential Diagnoses include developing sepsis, pilonidal abscess, cellulitis, discitis, meningitis, etc.  Critical Interventions-    Medications  morphine (PF) 4 MG/ML injection 4 mg (4 mg Intravenous Given 09/20/21 0939)  ondansetron (ZOFRAN) injection 4 mg (4 mg Intravenous Given 09/20/21 0755)  morphine (PF) 4 MG/ML injection 4 mg (4 mg Intravenous Given 09/20/21 0755)  lactated ringers bolus 1,000 mL (0 mLs Intravenous Stopped 09/20/21 1000)  ibuprofen (ADVIL) tablet 600 mg (600 mg Oral Given 09/20/21 0755)  iohexol (OMNIPAQUE) 300 MG/ML solution 100 mL (100 mLs Intravenous Contrast Given 09/20/21 0818)  gadobutrol (GADAVIST) 1 MMOL/ML injection 10 mL (10 mLs Intravenous Contrast Given 09/20/21 0858)  lidocaine-EPINEPHrine (XYLOCAINE W/EPI) 2 %-1:200000 (PF) injection 10 mL (10 mLs Infiltration Given by Other 09/20/21 1011)  oxyCODONE-acetaminophen (PERCOCET/ROXICET) 5-325 MG per tablet 1 tablet (1 tablet Oral Given 09/20/21 1049)    Reassessment after intervention: Pain significantly improved after I&D.  I decided to review pertinent External Data, and in summary patient seen  in the ED on two prior occasions (8/17 and 8/29).   Clinical Laboratory Tests Ordered, included CBC without leukocytosis. Normal lactic acid. HIV negative. UA without infection. Mildly elevated LFTs.   Radiologic Tests Ordered, included CT pelvis and MRI spine. I independently interpreted the images and agree with radiology interpretation.   Cardiac Monitor Tracing which shows sinus tachycardia.    Social Determinants of Health Risk no smoking  of IVDA.  Medical Decision Making: Summary:  Patient presents to the ED for fever, lower back pain, HA, diarrhea. Has a pilonidal cyst/abscess which is a possible source of infection and pain. Will repeat CT pelvis with contrast and obtain sepsis labs. Will add HIV and RMSF labs along with cultures. Patient with some subjective weakness in the right led with pain following an injury. Will move forward with MRI and will make this w/ and w/o contrast given fever.   Reevaluation with update and discussion with patient. He is much more comfortable on reassessment. Wound packed. Pain well controlled. No evidence of sepsis on labs. Plan to switch to Bactrim. Kidney function normal. Percocet prescribed and patient to return for wound re-evaluation in 2-3 days. Will send fluid for culture but clinically suspect MRSA.  Considered admission but labs reassuring. No sepsis. Pain well controlled.  Disposition: discharge  ____________________________________________  FINAL CLINICAL IMPRESSION(S) / ED DIAGNOSES  Final diagnoses:  Pilonidal abscess  Acute midline low back pain with right-sided sciatica  Fever, unspecified     NEW OUTPATIENT MEDICATIONS STARTED DURING THIS VISIT:  Discharge Medication List as of 09/20/2021 12:29 PM     START taking these medications   Details  ondansetron (ZOFRAN-ODT) 4 MG disintegrating tablet Take 1 tablet (4 mg total) by mouth every 8 (eight) hours as needed for nausea or vomiting., Starting Fri 09/20/2021, Normal    oxyCODONE-acetaminophen (PERCOCET/ROXICET) 5-325 MG tablet Take 1 tablet by mouth every 6 (six) hours as needed for severe pain., Starting Fri 09/20/2021, Normal    senna-docusate (SENOKOT-S) 8.6-50 MG tablet Take 1 tablet by mouth at bedtime as needed for mild constipation or moderate constipation., Starting Fri 09/20/2021, Normal    sulfamethoxazole-trimethoprim (BACTRIM DS) 800-160 MG tablet Take 1 tablet by mouth 2 (two) times daily for 7 days.,  Starting Fri 09/20/2021, Until Fri 09/27/2021, Normal        Note:  This document was prepared using Dragon voice recognition software and may include unintentional dictation errors.  Alona Bene, MD, [facility] Emergency Medicine    Janara Klett, Arlyss Repress, MD 09/20/21 [phone]

## 2021-09-20 NOTE — ED Triage Notes (Signed)
Pt arrives POV for eval of severe lower back pain w/ radiation to blt lets. Reports legs feel heavy and painful R>L. Endorses hx of pilonidal cyst that was recently drained at [facility] on 8/29, sent home on PO Doxy however pt reports continues to feel poorly. Febrile to 103 on arrival to ED, chills/shaking and tearful.

## 2021-09-20 NOTE — ED Notes (Signed)
Discharge instructions reviewed and education provided. All questions answered. Pt verbalizes understanding. Work note obtained for pt. Pt ambulated out of ER in stable condition with all belongings.

## 2021-09-21 LAB — URINE CULTURE: Culture: NO GROWTH

## 2021-09-23 LAB — ROCKY MTN SPOTTED FVR ABS PNL(IGG+IGM)
RMSF IgG: NEGATIVE
RMSF IgM: 0.32 index (ref 0.00–0.89)

## 2021-09-24 ENCOUNTER — Other Ambulatory Visit (HOSPITAL_COMMUNITY): Payer: Self-pay

## 2021-09-24 LAB — AEROBIC/ANAEROBIC CULTURE W GRAM STAIN (SURGICAL/DEEP WOUND): Special Requests: NORMAL

## 2021-09-24 MED ORDER — OXYCODONE-ACETAMINOPHEN 5-325 MG PO TABS
1.0000 | ORAL_TABLET | Freq: Four times a day (QID) | ORAL | 0 refills | Status: DC | PRN
  Filled 2021-09-24: qty 5, 2d supply, fill #0

## 2021-09-25 ENCOUNTER — Other Ambulatory Visit (HOSPITAL_COMMUNITY): Payer: Self-pay

## 2021-09-25 ENCOUNTER — Other Ambulatory Visit (HOSPITAL_COMMUNITY): Payer: Self-pay | Admitting: Pharmacist

## 2021-09-25 LAB — CULTURE, BLOOD (ROUTINE X 2)
Culture: NO GROWTH
Culture: NO GROWTH
Special Requests: ADEQUATE
Special Requests: ADEQUATE

## 2021-09-25 MED ORDER — AMOXICILLIN-POT CLAVULANATE 875-125 MG PO TABS
1.0000 | ORAL_TABLET | Freq: Two times a day (BID) | ORAL | 0 refills | Status: DC
  Filled 2021-09-25: qty 60, 30d supply, fill #0

## 2021-09-25 NOTE — Progress Notes (Signed)
Spoke with patient and discussed culture results.  Explained that he should stop taking bactrim and start taking augmentin.  He understands he needs to follow up with infectious diseases next week.  Patient voiced understanding.  Celedonio Miyamoto, PharmD, BCIDP Clinical Pharmacist Phone [phone]

## 2021-10-02 ENCOUNTER — Ambulatory Visit: Payer: Self-pay | Admitting: Surgery

## 2021-10-02 NOTE — H&P (Signed)
Michael Lam [id number]   Referring Provider:  Self   Subjective   Chief Complaint: Follow-up (pilonidal cyst)     History of Present Illness:    26 year old male following up regarding pilonidal abscess.  He has been having issues with this since 2017.  He states it has flared and required I&D 3 or 4 times in the interval.  He has been evaluated in the emergency room twice over the last month requiring I&D which was insufficient, and then saw Puja in urgent office on 9/5 where a repeat I&D was performed so that this could be adequately drained.  He is also followed up with infectious disease for antibiotic therapy.  His last visit here was 9/8.    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  Review of Systems: A complete review of systems was obtained from the patient.  I have reviewed this information and discussed as appropriate with the patient.  See HPI as well for other ROS.   Medical History: History reviewed. No pertinent past medical history.  There is no problem list on file for this patient.   History reviewed. No pertinent surgical history.   Allergies  Allergen Reactions   Methotrexate Hives    Itchy rash on upper body and face after 1 dose   Peanut Rash   Peanut Oil Rash   Robaxin [Methocarbamol] Nausea   Egg Abdominal Pain    Other reaction(s): Abdominal Pain    Hydrocodone-Acetaminophen Rash    Rash on thigh and upper body with itching    Current Outpatient Medications on File Prior to Visit  Medication Sig Dispense Refill   acetaminophen (TYLENOL) 650 MG ER tablet Take 650 mg by mouth every 8 (eight) hours as needed     amoxicillin-clavulanate (AUGMENTIN) 875-125 mg tablet Take 1 tablet by mouth 2 (two) times daily     betamethasone dipropionate (DIPROSONE) 0.05 % ointment  (Patient not taking: Reported on 09/24/2021)     cetirizine (ZYRTEC) 10 MG tablet Take by mouth (Patient not taking: Reported on 09/24/2021)      fluticasone propionate (FLONASE) 50 mcg/actuation nasal spray by Nasal route (Patient not taking: Reported on 09/24/2021)     folic acid (FOLVITE) 1 MG tablet  (Patient not taking: Reported on 09/24/2021)     halobetasol (ULTRAVATE) 0.05 % cream Apply topically (Patient not taking: Reported on 09/24/2021)     methotrexate (RHEUMATREX) 2.5 MG tablet  (Patient not taking: Reported on 09/24/2021)     montelukast (SINGULAIR) 10 mg tablet  (Patient not taking: Reported on 09/24/2021)     oxyCODONE-acetaminophen (PERCOCET) 5-325 mg tablet Take by mouth     phentermine (ADIPEX-P) 37.5 mg tablet TK 1 T PO  QD (Patient not taking: Reported on 09/24/2021)     zolpidem (AMBIEN) 5 MG tablet  (Patient not taking: Reported on 09/24/2021)     No current facility-administered medications on file prior to visit.    Family History  Problem Relation Age of Onset   High blood pressure (Hypertension) Father    Diabetes Father      Social History   Tobacco Use  Smoking Status Never  Smokeless Tobacco Not on file     Social History   Socioeconomic History   Marital status: Unknown  Tobacco Use   Smoking status: Never    Objective:    Vitals:   10/02/21 1456  BP: 100/70  Pulse: (!) 113  Temp: 36.8 C (98.2  F)  Weight: (!) 102.1 kg (225 lb)  Height: 180.3 cm (5\' 11" )    Body mass index is 31.38 kg/m.  Alert and well-appearing Unlabored respiration Subcentimeter wound to the left of the natal cleft superiorly.  About 1.5 cm inferior to this in the midline is a solitary pit  Assessment and Plan:  Diagnoses and all orders for this visit:  Pilonidal abscess    He has had several episodes of infection related to pilonidal cyst.  I do think is reasonable at this time to proceed with more aggressive treatment and I discussed the various options including cyst excision, marsupialization and trephination.  I recommend the latter.  I discussed the procedure in detail with him including anticipated  postop care and recovery, risk of bleeding, infection, pain, scarring, and we discussed that there is a small rate of recurrence of pilonidal disease after this procedure and after any procedure for pilonidal disease.  Questions were welcomed and answered to satisfaction.  We will plan to proceed with scheduling.   Krystyna Cleckley , MD

## 2021-10-03 ENCOUNTER — Telehealth: Payer: Self-pay

## 2021-10-03 ENCOUNTER — Ambulatory Visit: Payer: Commercial Managed Care - PPO | Admitting: Internal Medicine

## 2021-10-03 NOTE — Progress Notes (Deleted)
[facility]  Reason for Consult: Actinomyces infection  Referring Provider: Celedonio Miyamoto   HPI:    Michael Lam is a 26 y.o. male with PMHx as below who presents to the clinic for actinomyces infection.   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  This has recently required I&D x 3-4 times either in the ED or at the surgery office.  He has been treated with antibiotics for this issue but recent cultures have grown Actinomyces plus anaerobes and this was switched to Augmentin on ~ 09/30/21.  Surgery is planning for a pilonidal cystectomy in the future.  He is tolerating Augmentin without issues.    Patient's Medications  New Prescriptions   No medications on file  Previous Medications   ACETAMINOPHEN (TYLENOL) 650 MG CR TABLET    Take 650 mg by mouth every 8 (eight) hours as needed for pain.   AMOXICILLIN-CLAVULANATE (AUGMENTIN) 875-125 MG TABLET    Take 1 tablet by mouth 2 (two) times daily.   ASPIRIN-ACETAMINOPHEN-CAFFEINE (EXCEDRIN MIGRAINE) 250-250-65 MG TABLET    Take 2 tablets by mouth every 6 (six) hours as needed for headache or migraine.   FEXOFENADINE (ALLEGRA) 180 MG TABLET    Take 180 mg by mouth daily.   IBUPROFEN (ADVIL) 200 MG TABLET    Take 400 mg by mouth every 6 (six) hours as needed for mild pain or moderate pain.   ONDANSETRON (ZOFRAN-ODT) 4 MG DISINTEGRATING TABLET    Take 1 tablet (4 mg total) by mouth every 8 (eight) hours as needed for nausea or vomiting.   OXYCODONE-ACETAMINOPHEN (PERCOCET/ROXICET) 5-325 MG TABLET    Take 1 tablet by mouth every 6 (six) hours as needed for pain for up to 5 days   PHENTERMINE (ADIPEX-P) 37.5 MG TABLET    Take 1 tablet (37.5 mg total) by mouth daily.   SENNA-DOCUSATE (SENOKOT-S) 8.6-50 MG TABLET    Take 1 tablet by mouth at bedtime as needed for mild constipation or moderate constipation.  Modified Medications   No medications on file   Discontinued Medications   No medications on file      Past Medical History:  Diagnosis Date   Allergy    Anxiety    Asthma    Recurrent upper respiratory infection (URI)     Social History   Tobacco Use   Smoking status: Never   Smokeless tobacco: Never  Vaping Use   Vaping Use: Never used  Substance Use Topics   Alcohol use: No    Alcohol/week: 0.0 standard drinks of alcohol    Comment: rarely   Drug use: No    Family History  Problem Relation Age of Onset   Thyroid disease Mother    Chiari malformation Mother    Hyperlipidemia Father    Hypertension Father    Diabetes Father    Rheum arthritis Father    Diabetes Maternal Grandmother    Migraines Maternal Grandmother    Rheum arthritis Maternal Grandmother    Stroke Maternal Grandfather    Hypertension Maternal Grandfather    Stroke Paternal Grandfather    Healthy Sister    Healthy Brother     Allergies  Allergen Reactions   Methotrexate Derivatives Hives, Itching and Other (See Comments)    Itchy rash on upper body and face after 1 dose   Peanut Oil Rash   Peanut-Containing Drug Products Rash   Trexall [Methotrexate] Hives, Itching and Other (See Comments)  Itchy rash on upper body and face after 1 dose   Robaxin [Methocarbamol] Other (See Comments)    Causes headache, fever, dizziness    Egg White (Egg Protein) Other (See Comments)    Abdominal Pain   Eggs Or Egg-Derived Products Other (See Comments)    Abdominal Pain   Norco [Hydrocodone-Acetaminophen] Itching, Rash and Other (See Comments)    Rash on thigh and upper body with itching   Plaquenil [Hydroxychloroquine] Itching, Rash and Other (See Comments)    Entire body itches    ROS    OBJECTIVE:    There were no vitals filed for this visit.   There is no height or weight on file to calculate BMI.  Physical Exam   Labs and Microbiology:     Latest Ref Rng & Units 09/20/2021    7:27 AM 09/17/2021   11:13 PM 09/05/2021   12:10 AM   CBC  WBC 4.0 - 10.5 K/uL 8.6  8.1  5.0   Hemoglobin 13.0 - 17.0 g/dL 62.8  31.5  17.6   Hematocrit 39.0 - 52.0 % 40.6  39.1  43.5   Platelets 150 - 400 K/uL 152  151  159       Latest Ref Rng & Units 09/20/2021    7:27 AM 09/17/2021   11:13 PM 09/05/2021   12:10 AM  CMP  Glucose 70 - 99 mg/dL 160  99  97   BUN 6 - 20 mg/dL 10  10  10    Creatinine 0.61 - 1.24 mg/dL  7.37  1.06   Sodium 135 - 145 mmol/L 136  132  137   Potassium 3.5 - 5.1 mmol/L 3.7  3.3  3.9   Chloride 98 - 111 mmol/L 104  103  107   CO2 22 - 32 mmol/L 22  22  21    Calcium 8.9 - 10.3 mg/dL 8.6  8.0  9.2   Total Protein 6.5 - 8.1 g/dL 6.8  6.9    Total Bilirubin 0.3 - 1.2 mg/dL 0.7  0.9    Alkaline Phos 38 - 126 U/L 127  131    AST 15 - 41 U/L 50  73    ALT 0 - 44 U/L 84  126       No results found for this or any previous visit (from the past 240 hour(s)).  Imaging: ***   ASSESSMENT & PLAN:    No problem-specific Assessment & Plan notes found for this encounter.   No orders of the defined types were placed in this encounter.     Patient here today with pilonidal subcutaneous abscess status post I&D x 3 with plans for pilonidal cystectomy with general surgery ***.  Currently on Augmentin after cultures grew Actinomyces and anaerobes.  Tolerating well thus far.  Discussed treatment of Actinomyces typically requires longer than normal antibiotic course but his does not appear consistent with classic actinomyces syndrome so hopefully can do about 6 weeks total.  He is currently on day # ***.  Final duration likely to be determined based on surgical course as well.  Will follow up in 4 weeks and send in a new refill on his medication today.  LFTs elevated since 2021 and overall stable.  Will screen for viral hepatitis today.  Recent HIV testing negative.   for Infectious Disease [facility] 10/03/2021, 4:36 AM

## 2021-10-03 NOTE — Telephone Encounter (Signed)
Attempted to call patient regarding appointment for today. If not able to make it today can reschedule with MD different day. Needs to be seen before 10/6. Not able to reach him at this time. Juanita Laster, RMA

## 2021-10-09 ENCOUNTER — Other Ambulatory Visit (HOSPITAL_COMMUNITY): Payer: Self-pay

## 2021-10-15 ENCOUNTER — Other Ambulatory Visit (HOSPITAL_COMMUNITY): Payer: Self-pay

## 2021-11-05 ENCOUNTER — Other Ambulatory Visit (HOSPITAL_COMMUNITY): Payer: Self-pay

## 2021-11-08 ENCOUNTER — Other Ambulatory Visit (HOSPITAL_COMMUNITY): Payer: Self-pay

## 2021-11-26 ENCOUNTER — Telehealth: Payer: Self-pay | Admitting: Nurse Practitioner

## 2021-11-26 ENCOUNTER — Encounter: Payer: Self-pay | Admitting: Nurse Practitioner

## 2021-11-26 ENCOUNTER — Ambulatory Visit (INDEPENDENT_AMBULATORY_CARE_PROVIDER_SITE_OTHER): Payer: Commercial Managed Care - PPO | Admitting: Nurse Practitioner

## 2021-11-26 ENCOUNTER — Other Ambulatory Visit (HOSPITAL_COMMUNITY)
Admission: RE | Admit: 2021-11-26 | Discharge: 2021-11-26 | Disposition: A | Discharge status: Home / Self Care | Payer: Commercial Managed Care - PPO | Source: Ambulatory Visit | Attending: Nurse Practitioner | Admitting: Nurse Practitioner

## 2021-11-26 ENCOUNTER — Other Ambulatory Visit (HOSPITAL_COMMUNITY): Payer: Self-pay

## 2021-11-26 VITALS — BP 98/66 | HR 66 | Temp 97.3°F | Resp 10 | Ht 71.0 in | Wt 231.2 lb

## 2021-11-26 DIAGNOSIS — G478 Other sleep disorders: Secondary | ICD-10-CM | POA: Diagnosis not present

## 2021-11-26 DIAGNOSIS — F419 Anxiety disorder, unspecified: Secondary | ICD-10-CM

## 2021-11-26 DIAGNOSIS — E6609 Other obesity due to excess calories: Secondary | ICD-10-CM | POA: Diagnosis not present

## 2021-11-26 DIAGNOSIS — M0609 Rheumatoid arthritis without rheumatoid factor, multiple sites: Secondary | ICD-10-CM

## 2021-11-26 DIAGNOSIS — Z113 Encounter for screening for infections with a predominantly sexual mode of transmission: Secondary | ICD-10-CM | POA: Diagnosis present

## 2021-11-26 DIAGNOSIS — Z760 Encounter for issue of repeat prescription: Secondary | ICD-10-CM

## 2021-11-26 DIAGNOSIS — Z23 Encounter for immunization: Secondary | ICD-10-CM

## 2021-11-26 DIAGNOSIS — Z6832 Body mass index (BMI) 32.0-32.9, adult: Secondary | ICD-10-CM

## 2021-11-26 DIAGNOSIS — Z0000 Encounter for general adult medical examination without abnormal findings: Secondary | ICD-10-CM | POA: Diagnosis not present

## 2021-11-26 LAB — COMPREHENSIVE METABOLIC PANEL
ALT: 21 U/L (ref 0–53)
AST: 23 U/L (ref 0–37)
Albumin: 4.9 g/dL (ref 3.5–5.2)
Alkaline Phosphatase: 45 U/L (ref 39–117)
BUN: 11 mg/dL (ref 6–23)
CO2: 27 mEq/L (ref 19–32)
Calcium: 9.7 mg/dL (ref 8.4–10.5)
Chloride: 104 mEq/L (ref 96–112)
Creatinine, Ser: 0.95 mg/dL (ref 0.40–1.50)
GFR: 109.89 mL/min (ref >60.00)
Glucose, Bld: 98 mg/dL (ref 70–99)
Potassium: 4 mEq/L (ref 3.5–5.1)
Sodium: 140 mEq/L (ref 135–145)
Total Bilirubin: 0.6 mg/dL (ref 0.2–1.2)
Total Protein: 7.3 g/dL (ref 6.0–8.3)

## 2021-11-26 LAB — CBC
HCT: 42.6 % (ref 39.0–52.0)
Hemoglobin: 13.8 g/dL (ref 13.0–17.0)
MCHC: 32.4 g/dL (ref 30.0–36.0)
MCV: 84.7 fl (ref 78.0–100.0)
Platelets: 198 10*3/uL (ref 150.0–400.0)
RBC: 5.03 Mil/uL (ref 4.22–5.81)
RDW: 14.1 % (ref 11.5–15.5)
WBC: 7.8 10*3/uL (ref 4.0–10.5)

## 2021-11-26 LAB — LIPID PANEL
Cholesterol: 156 mg/dL (ref 0–200)
HDL: 48.9 mg/dL (ref >39.00)
LDL Cholesterol: 94 mg/dL (ref 0–99)
NonHDL: 106.97
Total CHOL/HDL Ratio: 3
Triglycerides: 63 mg/dL (ref 0.0–149.0)
VLDL: 12.6 mg/dL (ref 0.0–40.0)

## 2021-11-26 LAB — TSH: TSH: 2.21 u[IU]/mL (ref 0.35–5.50)

## 2021-11-26 LAB — HEMOGLOBIN A1C: Hgb A1c MFr Bld: 5.2 % (ref 4.6–6.5)

## 2021-11-26 MED ORDER — ONDANSETRON 4 MG PO TBDP
4.0000 mg | ORAL_TABLET | Freq: Three times a day (TID) | ORAL | 0 refills | Status: DC | PRN
  Filled 2021-11-26: qty 20, 7d supply, fill #0

## 2021-11-26 MED ORDER — WEGOVY 0.25 MG/0.5ML ~~LOC~~ SOAJ
0.2500 mg | SUBCUTANEOUS | 0 refills | Status: DC
  Filled 2021-11-26: qty 2, 28d supply, fill #0

## 2021-11-26 MED ORDER — WEGOVY 0.25 MG/0.5ML ~~LOC~~ SOAJ: 0.2500 mg | SUBCUTANEOUS | 0 refills | Status: DC

## 2021-11-26 MED ORDER — BUPROPION HCL ER (XL) 150 MG PO TB24: 150.0000 mg | ORAL_TABLET | Freq: Every day | ORAL | 1 refills | Status: DC

## 2021-11-26 MED ORDER — BUPROPION HCL ER (XL) 150 MG PO TB24
150.0000 mg | ORAL_TABLET | Freq: Every day | ORAL | 1 refills | Status: DC
  Filled 2021-11-26: qty 90, 90d supply, fill #0

## 2021-11-26 NOTE — Assessment & Plan Note (Signed)
Pleasant Wellbutrin in the past.  Patient states he does have periods where he feels like it would be beneficial.  Patient was on the "high-dose".  We will start patient on Wellbutrin 150 mg XR did discuss patient that if it is truly only anxiety it will cause an increase.  We will follow-up in 3 months with [facility] and Wellbutrin.  PHQ-9 and GAD-7 administered office.  Patient denies HI/SI/AVH.

## 2021-11-26 NOTE — Progress Notes (Signed)
New Patient Office Visit  Subjective    Patient ID: Michael Lam, male    DOB: [birth year]  Age: 26 y.o. MRN: [id number]  CC:  Chief Complaint  Patient presents with   Establish Care    Previous PCP Dr Mitchel Honour    HPI [name] presents to establish care  for complete physical and follow up of chronic conditions.  Immunizations: -Tetanus:2018 -Influenza: up today  -Shingles: Too young -Pneumonia: Too young  -HPV: Discussed at office  Diet: Fair diet. States that he is getting 2 meal big meals a day. Some snacks in between. Lots of water. No sodas. Will have cranberry juice. Coffee with creamer and sugar Exercise:  States that he has been doing daily to the gym. He will walk and weights approx 45 mins at a time   Eye exam:  PRN. Needs an appointment   Dental exam: Completes semi-annually    Colonoscopy: Too young, currently average risk Lung Cancer Screening: Does not qualify Dexa: Too young  PSA: Too young, currently average risk  Sleep:States that on his days off he will go to bed aroun 11pm and will get up around 10-11am. Does not feel rested. Does snore. Has had a sleep study in the past that was at home. States that it was inconclusive. Had an in person appt but got cacerlled due to him not feeling well. States that he will wake up gasping for air. Last episode approx 1 month ago.  Weight loss: he has done pheteramine in the past. States that when he took it it did help in the beginning but then the effects started wearing off He was at 245 and has lost some weight on his one.      Outpatient Encounter Medications as of 11/26/2021  Medication Sig   acetaminophen (TYLENOL) 650 MG CR tablet Take 650 mg by mouth every 8 (eight) hours as needed for pain.   aspirin-acetaminophen-caffeine (EXCEDRIN MIGRAINE) 250-250-65 MG tablet Take 2 tablets by mouth every 6 (six) hours as needed for headache or migraine.   fexofenadine (ALLEGRA) 180 MG  tablet Take 180 mg by mouth daily.   ibuprofen (ADVIL) 200 MG tablet Take 400 mg by mouth every 6 (six) hours as needed for mild pain or moderate pain.   ondansetron (ZOFRAN-ODT) 4 MG disintegrating tablet Take 1 tablet (4 mg total) by mouth every 8 (eight) hours as needed for nausea or vomiting.   oxyCODONE-acetaminophen (PERCOCET/ROXICET) 5-325 MG tablet Take 1 tablet by mouth every 6 (six) hours as needed for pain for up to 5 days   senna-docusate (SENOKOT-S) 8.6-50 MG tablet Take 1 tablet by mouth at bedtime as needed for mild constipation or moderate constipation.   [DISCONTINUED] buPROPion (WELLBUTRIN XL) 150 MG 24 hr tablet Take 1 tablet (150 mg total) by mouth daily.   [DISCONTINUED] ondansetron (ZOFRAN-ODT) 4 MG disintegrating tablet Take 1 tablet (4 mg total) by mouth every 8 (eight) hours as needed for nausea or vomiting.   [DISCONTINUED] Semaglutide-Weight Management (WEGOVY) 0.25 MG/0.5ML SOAJ Inject 0.25 mg into the skin once a week.   buPROPion (WELLBUTRIN XL) 150 MG 24 hr tablet Take 1 tablet (150 mg total) by mouth daily.   phentermine (ADIPEX-P) 37.5 MG tablet Take 1 tablet (37.5 mg total) by mouth daily. (Patient not taking: Reported on 09/05/2021)   Semaglutide-Weight Management (WEGOVY) 0.25 MG/0.5ML SOAJ Inject 0.25 mg into the skin once a week.   [DISCONTINUED] amoxicillin-clavulanate (AUGMENTIN) 875-125 MG tablet Take 1 tablet by mouth 2 (two)  times daily.   No facility-administered encounter medications on file as of 11/26/2021.    Past Medical History:  Diagnosis Date   Allergy    Anxiety    Asthma    Pilonidal cyst    Recurrent upper respiratory infection (URI)     Past Surgical History:  Procedure Laterality Date   ADENOIDECTOMY     SKIN BIOPSY  10/07/2018   right 5th digit.    TONSILLECTOMY      Family History  Problem Relation Age of Onset   Thyroid disease Mother    Chiari malformation Mother    Hyperlipidemia Father    Hypertension Father     Diabetes Father    Rheum arthritis Father    Other Father        alpha thylosemia   Fibromyalgia Father    Lupus Sister    Thyroid disease Brother    Diabetes Maternal Grandmother    Migraines Maternal Grandmother    Rheum arthritis Maternal Grandmother    Stroke Maternal Grandfather    Hypertension Maternal Grandfather    Hypertension Paternal Grandmother    Asthma Paternal Grandmother    Migraines Paternal Grandmother    Stroke Paternal Grandfather     Social History   Socioeconomic History   Marital status: Single    Spouse name: Not on file   Number of children: 0   Years of education: Not on file   Highest education level: Associate degree: academic program  Occupational History    Employer: [employer]  Tobacco Use   Smoking status: Never    Passive exposure: Never   Smokeless tobacco: Never  Vaping Use   Vaping Use: Every day   Substances: Nicotine, Flavoring  Substance and Sexual Activity   Alcohol use: No    Alcohol/week: 0.0 standard drinks of alcohol    Comment: rarely   Drug use: No   Sexual activity: Not Currently  Other Topics Concern   Not on file  Social History Narrative   He lives at home alone   He works at [employer] as a Charity fundraiser   He is right handed      Fulltime: Charity fundraiser and Media planner: Work    International aid/development worker of Corporate investment banker Strain: Not on Ship broker Insecurity: Not on file  Transportation Needs: Not on file  Physical Activity: Not on file  Stress: Not on file  Social Connections: Not on file  Intimate Partner Violence: Not on file    Review of Systems  Constitutional:  Negative for chills and fever.  Respiratory:  Negative for shortness of breath.   Cardiovascular:  Negative for chest pain.  Gastrointestinal:  Negative for abdominal pain, blood in stool, constipation, diarrhea, nausea and vomiting.       Bm depends.  States he will go from regular to constipation to almost diarrhea style   Genitourinary:  Negative for dysuria and hematuria.  Neurological:  Positive for headaches (intermittent).  Psychiatric/Behavioral:  Negative for hallucinations and suicidal ideas.         Objective    BP 98/66   Pulse 66   Temp (!) 97.3 F (36.3 C) (Temporal)   Resp 10   Ht 5\' 11"  (1.803 m)   Wt 231 lb 4 oz (104.9 kg)   SpO2 97%   BMI 32.25 kg/m   Physical Exam Vitals and nursing note reviewed.  Constitutional:      Appearance: Normal appearance.  HENT:     Right Ear: Tympanic membrane, ear canal and external ear normal.     Left Ear: Tympanic membrane, ear canal and external ear normal.     Mouth/Throat:     Mouth: Mucous membranes are moist.     Pharynx: Oropharynx is clear.  Eyes:     Extraocular Movements: Extraocular movements intact.     Pupils: Pupils are equal, round, and reactive to light.  Neck:     Thyroid: No thyroid mass, thyromegaly or thyroid tenderness.  Cardiovascular:     Rate and Rhythm: Normal rate and regular rhythm.     Pulses: Normal pulses.     Heart sounds: Normal heart sounds.  Pulmonary:     Effort: Pulmonary effort is normal.     Breath sounds: Normal breath sounds.  Abdominal:     General: Bowel sounds are normal. There is no distension.     Palpations: There is no mass.     Tenderness: There is no abdominal tenderness.     Hernia: No hernia is present.  Genitourinary:    Comments: Deferred Musculoskeletal:     Right lower leg: No edema.     Left lower leg: No edema.  Lymphadenopathy:     Cervical: No cervical adenopathy.  Skin:    General: Skin is warm.  Neurological:     General: No focal deficit present.     Mental Status: He is alert.     Deep Tendon Reflexes:     Reflex Scores:      Bicep reflexes are 2+ on the right side and 2+ on the left side.      Patellar reflexes are 2+ on the right side and 2+ on the left side.    Comments: Bilateral upper and lower extremity strength 5/5  Psychiatric:        Mood and  Affect: Mood normal.        Behavior: Behavior normal.        Thought Content: Thought content normal.        Judgment: Judgment normal.         Assessment & Plan:   Problem List Items Addressed This Visit       Nervous and Auditory   Non-restorative sleep    Patient having nonrestorative sleep.  Has done at home sleep study and patient states he woke up with a nasal cannula around his neck so was inconclusive.  Was scheduled for an in lab study the patient was not feeling well so they canceled.  Ambulatory referral placed for pulmonology for likely in person sleep study to assess for apnea      Relevant Orders   Ambulatory referral to Pulmonology     Musculoskeletal and Integument   Rheumatoid arthritis of multiple sites with negative rheumatoid factor (HCC)    Was followed by rheumatology in the past.  Patient also was on methotrexate at 1 point but had allergic reaction.  Not currently on any medications.  Not currently followed by rheumatology.        Other   Anxiety    Pleasant Wellbutrin in the past.  Patient states he does have periods where he feels like it would be beneficial.  Patient was on the "high-dose".  We will start patient on Wellbutrin 150 mg XR did discuss patient that if it is truly only anxiety it will cause an increase.  We will follow-up in 3 months with [facility] and Wellbutrin.  PHQ-9 and GAD-7 administered office.  Patient denies HI/SI/AVH.      Relevant Medications   buPROPion (WELLBUTRIN XL) 150 MG 24 hr tablet   Other Relevant Orders   TSH   Preventative health care - Primary    Discussed age-appropriate immunizations and screening exams.  Patient was given a packet of information at discharge with age-appropriate preventative healthcare maintenance with anticipatory guidance.  Patient was interested in STI screening.  Pending lab results today      Relevant Orders   CBC   Comprehensive metabolic panel   Lipid panel   TSH   Hemoglobin A1c    Class 1 obesity due to excess calories without serious comorbidity with body mass index (BMI) of 32.0 to 32.9 in adult    Patient to continue working on lifestyle modifications.  He is exercising appropriately.  Has lost weight and was long from original weight of 245.  We will try Wegovy 0.25 mg for a month then titrate monthly if patient is tolerating it.  Pending lab results      Relevant Medications   Semaglutide-Weight Management (WEGOVY) 0.25 MG/0.5ML SOAJ   Other Relevant Orders   Lipid panel   TSH   Hemoglobin A1c   Other Visit Diagnoses     Need for immunization against influenza       Relevant Orders   Flu Vaccine QUAD 6+ mos PF IM (Fluarix Quad PF) (Completed)   Medication refill       Relevant Medications   ondansetron (ZOFRAN-ODT) 4 MG disintegrating tablet   Screening for STD (sexually transmitted disease)       Relevant Orders   Urine cytology ancillary only   RPR   HIV Antibody (routine testing w rflx)   HSV(herpes simplex vrs) 1+2 ab-IgG       Return in about 3 months (around 02/26/2022) for Weight follow up.   Audria Nine, NP

## 2021-11-26 NOTE — Assessment & Plan Note (Signed)
Patient having nonrestorative sleep.  Has done at home sleep study and patient states he woke up with a nasal cannula around his neck so was inconclusive.  Was scheduled for an in lab study the patient was not feeling well so they canceled.  Ambulatory referral placed for pulmonology for likely in person sleep study to assess for apnea

## 2021-11-26 NOTE — Assessment & Plan Note (Signed)
Patient to continue working on lifestyle modifications.  He is exercising appropriately.  Has lost weight and was long from original weight of 245.  We will try Wegovy 0.25 mg for a month then titrate monthly if patient is tolerating it.  Pending lab results

## 2021-11-26 NOTE — Telephone Encounter (Signed)
Pt called stating anastasiya requested pics of pt's insurance card. Pt submitted pics via mychart. Call back # [phone]

## 2021-11-26 NOTE — Patient Instructions (Signed)
Nice to see you today I will be in touch with the labs once I have the results Follow up with me in 3 months, sooner if you need me 

## 2021-11-26 NOTE — Assessment & Plan Note (Signed)
Was followed by rheumatology in the past.  Patient also was on methotrexate at 1 point but had allergic reaction.  Not currently on any medications.  Not currently followed by rheumatology.

## 2021-11-26 NOTE — Assessment & Plan Note (Signed)
Discussed age-appropriate immunizations and screening exams.  Patient was given a packet of information at discharge with age-appropriate preventative healthcare maintenance with anticipatory guidance.  Patient was interested in STI screening.  Pending lab results today

## 2021-11-27 ENCOUNTER — Telehealth: Payer: Self-pay

## 2021-11-27 LAB — URINE CYTOLOGY ANCILLARY ONLY
Chlamydia: NEGATIVE
Comment: NEGATIVE
Comment: NEGATIVE
Comment: NORMAL
Neisseria Gonorrhea: NEGATIVE
Trichomonas: NEGATIVE

## 2021-11-27 LAB — HIV ANTIBODY (ROUTINE TESTING W REFLEX): HIV 1&2 Ab, 4th Generation: NONREACTIVE

## 2021-11-27 LAB — HSV(HERPES SIMPLEX VRS) I + II AB-IGG
HAV 1 IGG,TYPE SPECIFIC AB: <0.9 index
HSV 2 IGG,TYPE SPECIFIC AB: <0.9 index

## 2021-11-27 LAB — RPR: RPR Ser Ql: NONREACTIVE

## 2021-11-27 NOTE — Telephone Encounter (Signed)
noted 

## 2021-11-27 NOTE — Telephone Encounter (Signed)
PA submitted via covermymeds for [facility]. Pending.  (Key: [id number])

## 2021-11-29 ENCOUNTER — Other Ambulatory Visit (HOSPITAL_COMMUNITY): Payer: Self-pay

## 2021-11-29 NOTE — Telephone Encounter (Signed)
PA denied, explanation just says Wegovy not covered by the plan of this insurance. Do you want to appeal?

## 2021-11-29 NOTE — Telephone Encounter (Signed)
We can let the patient know and he can call his insurance company to see what weight loss medications they cover

## 2021-11-29 NOTE — Telephone Encounter (Signed)
Mychart message sent to the patient .

## 2021-12-03 NOTE — Telephone Encounter (Signed)
Please review, not sure anything else can be used for diagnoses

## 2021-12-05 ENCOUNTER — Other Ambulatory Visit (HOSPITAL_COMMUNITY): Payer: Self-pay

## 2021-12-06 NOTE — Telephone Encounter (Signed)
Appeal submitted via covermymeds. Contact plan to follow up on [id number] if needed.

## 2021-12-09 NOTE — Telephone Encounter (Signed)
Sent mychart message to the patient. 

## 2021-12-18 ENCOUNTER — Encounter: Payer: Self-pay | Admitting: Pulmonary Disease

## 2021-12-18 ENCOUNTER — Ambulatory Visit (INDEPENDENT_AMBULATORY_CARE_PROVIDER_SITE_OTHER): Payer: Commercial Managed Care - PPO | Admitting: Pulmonary Disease

## 2021-12-18 ENCOUNTER — Other Ambulatory Visit (HOSPITAL_COMMUNITY): Payer: Self-pay

## 2021-12-18 VITALS — BP 120/62 | HR 64 | Temp 98.2°F | Ht 71.0 in | Wt 230.6 lb

## 2021-12-18 DIAGNOSIS — G4733 Obstructive sleep apnea (adult) (pediatric): Secondary | ICD-10-CM

## 2021-12-18 MED ORDER — ZOLPIDEM TARTRATE 10 MG PO TABS
10.0000 mg | ORAL_TABLET | Freq: Every evening | ORAL | 1 refills | Status: DC | PRN
  Filled 2021-12-18: qty 30, 30d supply, fill #0

## 2021-12-18 NOTE — Addendum Note (Signed)
Addended byClyda Greener M on: 12/18/2021 11:56 AM   Modules accepted: Orders

## 2021-12-18 NOTE — Progress Notes (Signed)
Michael Lam    [id number]    [birth year]  Primary Care Physician:Cable, Genene Churn, NP  Referring Physician: Eden Emms, NP [address] [place],  [place] [address]  Chief complaint:   Patient being seen for gasping respirations at night  HPI:  Gasping respirations at night, happens about 2-3 times a month Literally jumps out of bed to catch his breath  History of snoring  There was enough concern a few years back and he did have a home sleep study that was not diagnostic An in lab study fell through  Usually goes to bed between 12 and 4 AM Takes him about 1 to 2 hours to fall asleep 1-2 awakenings Final wake up time varies  Home sleep study was in 2019  Never smoker Dad is on CPAP  Does have a history of insomnia for which he did use Ambien previously  Usually does not take naps History of asthma -Well-controlled at present, rarely needs rescue inhaler  Outpatient Encounter Medications as of 12/18/2021  Medication Sig   acetaminophen (TYLENOL) 650 MG CR tablet Take 650 mg by mouth every 8 (eight) hours as needed for pain.   aspirin-acetaminophen-caffeine (EXCEDRIN MIGRAINE) 250-250-65 MG tablet Take 2 tablets by mouth every 6 (six) hours as needed for headache or migraine.   buPROPion (WELLBUTRIN XL) 150 MG 24 hr tablet Take 1 tablet (150 mg total) by mouth daily.   fexofenadine (ALLEGRA) 180 MG tablet Take 180 mg by mouth daily.   ibuprofen (ADVIL) 200 MG tablet Take 400 mg by mouth every 6 (six) hours as needed for mild pain or moderate pain.   ondansetron (ZOFRAN-ODT) 4 MG disintegrating tablet Take 1 tablet (4 mg total) by mouth every 8 (eight) hours as needed for nausea or vomiting.   oxyCODONE-acetaminophen (PERCOCET/ROXICET) 5-325 MG tablet Take 1 tablet by mouth every 6 (six) hours as needed for pain for up to 5 days   senna-docusate (SENOKOT-S) 8.6-50 MG tablet Take 1 tablet by mouth at bedtime as needed for mild constipation or  moderate constipation.   phentermine (ADIPEX-P) 37.5 MG tablet Take 1 tablet (37.5 mg total) by mouth daily. (Patient not taking: Reported on 09/05/2021)   Semaglutide-Weight Management (WEGOVY) 0.25 MG/0.5ML SOAJ Inject 0.25 mg into the skin once a week. (Patient not taking: Reported on 12/18/2021)   No facility-administered encounter medications on file as of 12/18/2021.    Allergies as of 12/18/2021 - Review Complete 12/18/2021  Allergen Reaction Noted   Methotrexate derivatives Hives, Itching, and Other (See Comments) 11/23/2018   Peanut oil Rash 03/14/2016   Peanut-containing drug products Rash 03/14/2016   Trexall [methotrexate] Hives, Itching, and Other (See Comments) 11/23/2018   Robaxin [methocarbamol] Other (See Comments) 09/20/2021   Egg white (egg protein) Other (See Comments) 03/14/2016   Eggs or egg-derived products Other (See Comments) 03/14/2016   Norco [hydrocodone-acetaminophen] Itching, Rash, and Other (See Comments) 04/10/2017   Plaquenil [hydroxychloroquine] Itching, Rash, and Other (See Comments) 02/20/2020    Past Medical History:  Diagnosis Date   Allergy    Anxiety    Asthma    Pilonidal cyst    Recurrent upper respiratory infection (URI)     Past Surgical History:  Procedure Laterality Date   ADENOIDECTOMY     SKIN BIOPSY  10/07/2018   right 5th digit.    TONSILLECTOMY      Family History  Problem Relation Age of Onset   Thyroid disease Mother  Chiari malformation Mother    Hyperlipidemia Father    Hypertension Father    Diabetes Father    Rheum arthritis Father    Other Father        alpha thylosemia   Fibromyalgia Father    Lupus Sister    Thyroid disease Brother    Diabetes Maternal Grandmother    Migraines Maternal Grandmother    Rheum arthritis Maternal Grandmother    Stroke Maternal Grandfather    Hypertension Maternal Grandfather    Hypertension Paternal Grandmother    Asthma Paternal Grandmother    Migraines Paternal  Grandmother    Stroke Paternal Grandfather     Social History   Socioeconomic History   Marital status: Single    Spouse name: Not on file   Number of children: 0   Years of education: Not on file   Highest education level: Associate degree: academic program  Occupational History    Employer: [employer]  Tobacco Use   Smoking status: Never    Passive exposure: Never   Smokeless tobacco: Never   Tobacco comments:    Pt currently vapes.  Has nicotine.  Vaping Use   Vaping Use: Every day   Substances: Nicotine, Flavoring  Substance and Sexual Activity   Alcohol use: No    Alcohol/week: 0.0 standard drinks of alcohol    Comment: rarely   Drug use: No   Sexual activity: Not Currently  Other Topics Concern   Not on file  Social History Narrative   He lives at home alone   He works at [employer] as a Charity fundraiser   He is right handed      Fulltime: Water engineer: Work    International aid/development worker of Corporate investment banker Strain: Not on Ship broker Insecurity: Not on file  Transportation Needs: Not on file  Physical Activity: Not on file  Stress: Not on file  Social Connections: Not on file  Intimate Partner Violence: Not on file    Review of Systems  Respiratory:  Negative for shortness of breath.   Psychiatric/Behavioral:  Positive for sleep disturbance.     Vitals:   12/18/21 1108  BP: 120/62  Pulse: 64  Temp: 98.2 F (36.8 C)  SpO2: 100%     Physical Exam Constitutional:      Appearance: He is obese.  HENT:     Head: Normocephalic.     Mouth/Throat:     Mouth: Mucous membranes are moist.  Eyes:     Conjunctiva/sclera: Conjunctivae normal.  Cardiovascular:     Rate and Rhythm: Normal rate and regular rhythm.     Heart sounds: No murmur heard.    No friction rub.  Pulmonary:     Effort: No respiratory distress.     Breath sounds: No stridor. No wheezing or rhonchi.  Musculoskeletal:     Cervical back: No rigidity or tenderness.   Neurological:     Mental Status: He is alert.  Psychiatric:        Mood and Affect: Mood normal.       12/18/2021   11:00 AM 07/06/2017   11:00 AM  Results of the Epworth flowsheet  Sitting and reading 1 2  Watching TV 1 2  Sitting, inactive in a public place (e.g. a theatre or a meeting) 1 1  As a passenger in a car for an hour without a break 0 2  Lying down to rest in  the afternoon when circumstances permit 1 1  Sitting and talking to someone 0 0  Sitting quietly after a lunch without alcohol 2 3  In a car, while stopped for a few minutes in traffic 0 0  Total score 6 11    Data Reviewed: Past sleep study reviewed showing limited study however AHI was only 4.7  Assessment:  Nonrestorative sleep  Snoring, nocturnal choking episodes  Pathophysiology of sleep disordered breathing discussed with the patient Treatment options discussed with the patient  Insomnia  Plan/Recommendations: Will schedule the patient for an in lab study  Encouraged weight loss efforts  Behavioral changes including sleeping with the head of the bed elevated, side sleeping encouraged  Encouraged to call with significant concerns  Tentative follow-up in 3 to 4 months   Sherrilyn Rist MD  Pulmonary and Critical Care 12/18/2021, 11:12 AM  CC: Michela Pitcher, NP

## 2021-12-18 NOTE — Patient Instructions (Signed)
We will schedule you for an in lab split-night study  Sleeping with elevation of the head of the bed may help events Ensuring side sleeping position may also help  Call with significant concerns  Tentative follow-up in 3 to 4 months  Sleep Apnea Sleep apnea affects breathing during sleep. It causes breathing to stop for 10 seconds or more, or to become shallow. People with sleep apnea usually snore loudly. It can also increase the risk of: Heart attack. Stroke. Being very overweight (obese). Diabetes. Heart failure. Irregular heartbeat. High blood pressure. The goal of treatment is to help you breathe normally again. What are the causes?  The most common cause of this condition is a collapsed or blocked airway. There are three kinds of sleep apnea: Obstructive sleep apnea. This is caused by a blocked or collapsed airway. Central sleep apnea. This happens when the brain does not send the right signals to the muscles that control breathing. Mixed sleep apnea. This is a combination of obstructive and central sleep apnea. What increases the risk? Being overweight. Smoking. Having a small airway. Being older. Being male. Drinking alcohol. Taking medicines to calm yourself (sedatives or tranquilizers). Having family members with the condition. Having a tongue or tonsils that are larger than normal. What are the signs or symptoms? Trouble staying asleep. Loud snoring. Headaches in the morning. Waking up gasping. Dry mouth or sore throat in the morning. Being sleepy or tired during the day. If you are sleepy or tired during the day, you may also: Not be able to focus your mind (concentrate). Forget things. Get angry a lot and have mood swings. Feel sad (depressed). Have changes in your personality. Have less interest in sex, if you are male. Be unable to have an erection, if you are male. How is this treated?  Sleeping on your side. Using a medicine to get rid of  mucus in your nose (decongestant). Avoiding the use of alcohol, medicines to help you relax, or certain pain medicines (narcotics). Losing weight, if needed. Changing your diet. Quitting smoking. Using a machine to open your airway while you sleep, such as: An oral appliance. This is a mouthpiece that shifts your lower jaw forward. A CPAP device. This device blows air through a mask when you breathe out (exhale). An EPAP device. This has valves that you put in each nostril. A BIPAP device. This device blows air through a mask when you breathe in (inhale) and breathe out. Having surgery if other treatments do not work. Follow these instructions at home: Lifestyle Make changes that your doctor recommends. Eat a healthy diet. Lose weight if needed. Avoid alcohol, medicines to help you relax, and some pain medicines. Do not smoke or use any products that contain nicotine or tobacco. If you need help quitting, ask your doctor. General instructions Take over-the-counter and prescription medicines only as told by your doctor. If you were given a machine to use while you sleep, use it only as told by your doctor. If you are having surgery, make sure to tell your doctor you have sleep apnea. You may need to bring your device with you. Keep all follow-up visits. Contact a doctor if: The machine that you were given to use during sleep bothers you or does not seem to be working. You do not get better. You get worse. Get help right away if: Your chest hurts. You have trouble breathing in enough air. You have an uncomfortable feeling in your back, arms, or stomach. You  have trouble talking. One side of your body feels weak. A part of your face is hanging down. These symptoms may be an emergency. Get help right away. Call your local emergency services (911 in the [place] Do not wait to see if the symptoms will go away. Do not drive yourself to the hospital. Summary This condition affects  breathing during sleep. The most common cause is a collapsed or blocked airway. The goal of treatment is to help you breathe normally while you sleep. This information is not intended to replace advice given to you by your health care provider. Make sure you discuss any questions you have with your health care provider. Document Revised: 08/15/2020 Document Reviewed: 12/16/2019 Elsevier Patient Education  2023 [employer].

## 2021-12-27 ENCOUNTER — Other Ambulatory Visit (HOSPITAL_COMMUNITY): Payer: Self-pay

## 2022-01-08 ENCOUNTER — Encounter: Payer: Self-pay | Admitting: *Deleted

## 2022-01-08 ENCOUNTER — Other Ambulatory Visit (HOSPITAL_COMMUNITY): Payer: Self-pay

## 2022-01-08 ENCOUNTER — Encounter: Payer: Self-pay | Admitting: Nurse Practitioner

## 2022-01-08 ENCOUNTER — Ambulatory Visit (INDEPENDENT_AMBULATORY_CARE_PROVIDER_SITE_OTHER): Payer: Commercial Managed Care - PPO | Admitting: Nurse Practitioner

## 2022-01-08 VITALS — BP 116/68 | HR 86 | Temp 98.3°F | Resp 16 | Ht 71.0 in | Wt 235.0 lb

## 2022-01-08 DIAGNOSIS — R002 Palpitations: Secondary | ICD-10-CM | POA: Diagnosis not present

## 2022-01-08 DIAGNOSIS — G478 Other sleep disorders: Secondary | ICD-10-CM

## 2022-01-08 DIAGNOSIS — R21 Rash and other nonspecific skin eruption: Secondary | ICD-10-CM | POA: Diagnosis not present

## 2022-01-08 MED ORDER — KETOCONAZOLE 2 % EX CREA
1.0000 | TOPICAL_CREAM | Freq: Every day | CUTANEOUS | 0 refills | Status: AC
  Filled 2022-01-08: qty 15, 15d supply, fill #0

## 2022-01-08 NOTE — Assessment & Plan Note (Signed)
Ambiguous palpitations.  EKG reviewed in office showed normal sinus rhythm.  Did auscultate abnormal beats likely PAC versus PVC.  Patient educated on avoiding caffeine and staying hydrated.  Also if he takes preworkout to be careful with supplements.  Will refer to cardiology for heart monitor.  This could be secondary to patient's nonrestorative sleep/possible sleep apnea.  Patient is awaiting to get a in clinic sleep study done.  He is being followed by pulmonology

## 2022-01-08 NOTE — Assessment & Plan Note (Signed)
See clinical photo most closely resembles a fungal rash will prescribe ketoconazole 2% daily for 2 weeks.  If no improvement patient can use over-the-counter hydrocortisone cream 1 application twice daily for no longer than a week.  Ambulatory referral to dermatology at patient's request as sister has a history of skin cancer that required removal early in life.

## 2022-01-08 NOTE — Assessment & Plan Note (Signed)
Has seen pulmonology pending in clinic sleep study could be contributing to patient's palpitations.

## 2022-01-08 NOTE — Patient Instructions (Signed)
Nice to see you today Use the prescribed cream daily for 2 weeks.  If you are not showing improvement you can try hydrocortisone over the counter twice a day for a week I have referred you to Dermatology and cardiology. They will reach out to you

## 2022-01-08 NOTE — Progress Notes (Signed)
Established Patient Office Visit  Subjective   Patient ID: Michael Lam, male    DOB: Nov 06, 1995  Age: 26 y.o. MRN: [id number]  Chief Complaint  Patient presents with   Abrasion    Under armpits X September been growing. History of skin cancer. Burns and hurts.    HPI  Skin lesion: Since September under his left armpit. States that it looks like a stretch mark. States that he noticed that it started getting bigger States that he does have a lot of hair. States that he normal trims his hair. States that it has been sine the summer since he has shaved. States that it did not bother him. States that it is hurting approx 2 weeks. States that he has not changed deodorant. States sister had skin after birth. States on her foot and buttock.  States that it burns and is sensitive. Has not tried anything over the counter. States that he has cut back on deodorant on that side   Palpitations: Patient states that he was on his way to work and felt like he got punched in the chest and felt his heart running away. 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 States that it showed afib. States that his smart watch said his pulse was 130s+. States that it lasted for an extended period of time. He denies iilicit drug use. States that he had not had a large amount of caffeine that day    Review of Systems  Constitutional:  Negative for chills and fever.  Respiratory:  Negative for shortness of breath.   Cardiovascular:  Positive for palpitations. Negative for chest pain.  Skin:  Positive for rash.      Objective:     BP 116/68   Pulse 86   Temp 98.3 F (36.8 C)   Resp 16   Ht 5\' 11"  (1.803 m)   Wt 235 lb (106.6 kg)   SpO2 97%   BMI 32.78 kg/m    Physical Exam Vitals and nursing note reviewed.  Constitutional:      Appearance: Normal appearance.  Cardiovascular:     Rate and Rhythm: Normal rate. Rhythm irregular.     Heart sounds: Normal heart sounds.   Pulmonary:     Effort: Pulmonary effort is normal.     Breath sounds: Normal breath sounds.  Skin:    Comments: See clinical photo  No axillary or clavicular lymphadenopathy   Neurological:     Mental Status: He is alert.      No results found for any visits on 01/08/22.    The ASCVD Risk score (Arnett DK, et al., 2019) failed to calculate for the following reasons:   The 2019 ASCVD risk score is only valid for ages 36 to 75    Assessment & Plan:   Problem List Items Addressed This Visit       Nervous and Auditory   Non-restorative sleep    Has seen pulmonology pending in clinic sleep study could be contributing to patient's palpitations.        Musculoskeletal and Integument   Rash    See clinical photo most closely resembles a fungal rash will prescribe ketoconazole 2% daily for 2 weeks.  If no improvement patient can use over-the-counter hydrocortisone cream 1 application twice daily for no longer than a week.  Ambulatory referral to dermatology at patient's request as sister has a history of skin cancer that required removal early in life.  Relevant Medications   ketoconazole (NIZORAL) 2 % cream   Other Relevant Orders   Ambulatory referral to Dermatology     Other   Palpitations - Primary    Ambiguous palpitations.  EKG reviewed in office showed normal sinus rhythm.  Did auscultate abnormal beats likely PAC versus PVC.  Patient educated on avoiding caffeine and staying hydrated.  Also if he takes preworkout to be careful with supplements.  Will refer to cardiology for heart monitor.  This could be secondary to patient's nonrestorative sleep/possible sleep apnea.  Patient is awaiting to get a in clinic sleep study done.  He is being followed by pulmonology      Relevant Orders   EKG 12-Lead (Completed)   Ambulatory referral to Cardiology    Return if symptoms worsen or fail to improve.    Audria Nine, NP

## 2022-01-24 ENCOUNTER — Other Ambulatory Visit (HOSPITAL_COMMUNITY): Payer: Self-pay

## 2022-01-29 ENCOUNTER — Ambulatory Visit: Payer: Commercial Managed Care - PPO | Attending: Cardiovascular Disease | Admitting: Cardiovascular Disease

## 2022-02-03 ENCOUNTER — Encounter: Payer: Self-pay | Admitting: Nurse Practitioner

## 2022-02-03 DIAGNOSIS — F419 Anxiety disorder, unspecified: Secondary | ICD-10-CM

## 2022-02-03 DIAGNOSIS — Z760 Encounter for issue of repeat prescription: Secondary | ICD-10-CM

## 2022-02-04 ENCOUNTER — Other Ambulatory Visit (HOSPITAL_COMMUNITY): Payer: Self-pay

## 2022-02-04 MED ORDER — BUPROPION HCL ER (XL) 150 MG PO TB24
150.0000 mg | ORAL_TABLET | Freq: Every day | ORAL | 1 refills | Status: AC
  Filled 2022-02-04: qty 90, 90d supply, fill #0
  Filled 2022-08-01 (×2): qty 90, 90d supply, fill #1

## 2022-02-04 MED ORDER — ONDANSETRON 4 MG PO TBDP: 4.0000 mg | ORAL_TABLET | Freq: Three times a day (TID) | ORAL | 0 refills | Status: DC | PRN

## 2022-02-04 MED ORDER — BUPROPION HCL ER (XL) 150 MG PO TB24: 150.0000 mg | ORAL_TABLET | Freq: Every day | ORAL | 1 refills | Status: DC

## 2022-02-04 MED ORDER — ONDANSETRON 4 MG PO TBDP
4.0000 mg | ORAL_TABLET | Freq: Three times a day (TID) | ORAL | 0 refills | Status: DC | PRN
  Filled 2022-02-04: qty 20, 7d supply, fill #0

## 2022-02-04 NOTE — Addendum Note (Signed)
Addended by: Michela Pitcher on: 02/04/2022 12:19 PM   Modules accepted: Orders

## 2022-02-10 ENCOUNTER — Ambulatory Visit
Admission: EM | Admit: 2022-02-10 | Discharge: 2022-02-10 | Disposition: A | Discharge status: Home / Self Care | Payer: Commercial Managed Care - PPO | Attending: Nurse Practitioner | Admitting: Nurse Practitioner

## 2022-02-10 DIAGNOSIS — K611 Rectal abscess: Secondary | ICD-10-CM | POA: Diagnosis not present

## 2022-02-10 MED ORDER — SULFAMETHOXAZOLE-TRIMETHOPRIM 800-160 MG PO TABS: 1.0000 | ORAL_TABLET | Freq: Two times a day (BID) | ORAL | 0 refills | Status: DC

## 2022-02-10 MED ORDER — LIDOCAINE 5 % EX OINT: 1.0000 | TOPICAL_OINTMENT | Freq: Four times a day (QID) | CUTANEOUS | 0 refills | Status: AC | PRN

## 2022-02-10 NOTE — ED Triage Notes (Signed)
Pt c/o abscess next to anus.  Home interventions: tylenol   Started: 5 days ago

## 2022-02-10 NOTE — Discharge Instructions (Signed)
Bactrim twice daily for 7 days Lidocaine ointment to area as needed Warm compresses Follow-up with your PCP 2 to 3 days for recheck Please go to the emergency room for any worsening symptoms

## 2022-02-10 NOTE — ED Provider Notes (Signed)
[facility]    CSN: [id number] Arrival date & time: 02/10/22  1851      History   Chief Complaint Chief Complaint  Patient presents with   Abscess    HPI Michael Lam is a 27 y.o. male presents for evaluation of a possible abscess.  Patient reports 5 days of some pain and discomfort around his anus.  Earlier today he felt the area and felt a bump and is concerned for abscess.  Denies any fevers or chills or drainage from the area.  Does have a history of pilonidal cysts in the past.  No history of MRSA.  He has not used any OTC medications.  No other concerns at this time.   Abscess   Past Medical History:  Diagnosis Date   Allergy    Anxiety    Asthma    Pilonidal cyst    Recurrent upper respiratory infection (URI)     Patient Active Problem List   Diagnosis Date Noted   Palpitations 01/08/2022   Preventative health care 11/26/2021   Class 1 obesity due to excess calories without serious comorbidity with body mass index (BMI) of 32.0 to 32.9 in adult 11/26/2021   Non-restorative sleep 11/26/2021   Rheumatoid arthritis of multiple sites with negative rheumatoid factor ([place]) 11/23/2018   Family history of rheumatoid arthritis 11/23/2018   Chronic nonintractable headache 02/02/2018   Chronic allergic rhinitis 11/24/2017   Shift work sleep disorder 07/06/2017   Obstructive sleep apnea 07/06/2017   History of hypoglycemia 07/03/2017   Ganglion cyst of foot 06/17/2017   History of asthma 05/19/2017   Chronic migraine without aura without status migrainosus, not intractable 78/29/5621   Other complicated headache syndrome 12/09/2016   Rash 05/25/2015   Anxiety 04/13/2015    Past Surgical History:  Procedure Laterality Date   ADENOIDECTOMY     SKIN BIOPSY  10/07/2018   right 5th digit.    TONSILLECTOMY         Home Medications    Prior to Admission medications   Medication Sig Start Date End Date Taking? Authorizing Provider   acetaminophen (TYLENOL) 650 MG CR tablet Take 650 mg by mouth every 8 (eight) hours as needed for pain.    [provider]  aspirin-acetaminophen-caffeine (EXCEDRIN MIGRAINE) [phone] MG tablet Take 2 tablets by mouth every 6 (six) hours as needed for headache or migraine.    [provider]  buPROPion (WELLBUTRIN XL) 150 MG 24 hr tablet Take 1 tablet (150 mg total) by mouth daily. 02/04/22   Michela Pitcher, NP  fexofenadine (ALLEGRA) 180 MG tablet Take 180 mg by mouth daily.    [provider]  ibuprofen (ADVIL) 200 MG tablet Take 400 mg by mouth every 6 (six) hours as needed for mild pain or moderate pain.    [provider]  ketoconazole (NIZORAL) 2 % cream Apply 1 Application topically daily. For two weeks 01/08/22   Michela Pitcher, NP  ondansetron (ZOFRAN-ODT) 4 MG disintegrating tablet Take 1 tablet (4 mg total) by mouth every 8 (eight) hours as needed for nausea or vomiting. 02/04/22   Michela Pitcher, NP  oxyCODONE-acetaminophen (PERCOCET/ROXICET) 5-325 MG tablet Take 1 tablet by mouth every 6 (six) hours as needed for pain for up to 5 days 09/24/21     phentermine (ADIPEX-P) 37.5 MG tablet Take 1 tablet (37.5 mg total) by mouth daily. 10/15/20     Semaglutide-Weight Management (WEGOVY) 0.25 MG/0.5ML SOAJ Inject 0.25 mg into the skin  once a week. Patient not taking: Reported on 01/08/2022 11/26/21   Michela Pitcher, NP  senna-docusate (SENOKOT-S) 8.6-50 MG tablet Take 1 tablet by mouth at bedtime as needed for mild constipation or moderate constipation. 09/20/21   Long, Wonda Olds, MD  zolpidem (AMBIEN) 10 MG tablet Take 1 tablet (10 mg total) by mouth at bedtime as needed for sleep. 12/18/21 02/16/22  Laurin Coder, MD    Family History Family History  Problem Relation Age of Onset   Thyroid disease Mother    Chiari malformation Mother    Hyperlipidemia Father    Hypertension Father    Diabetes Father    Rheum arthritis Father    Other Father         alpha thylosemia   Fibromyalgia Father    Lupus Sister    Thyroid disease Brother    Diabetes Maternal Grandmother    Migraines Maternal Grandmother    Rheum arthritis Maternal Grandmother    Stroke Maternal Grandfather    Hypertension Maternal Grandfather    Hypertension Paternal Grandmother    Asthma Paternal Grandmother    Migraines Paternal Grandmother    Stroke Paternal Grandfather     Social History Social History   Tobacco Use   Smoking status: Never    Passive exposure: Never   Smokeless tobacco: Never   Tobacco comments:    Pt currently vapes.  Has nicotine.  Vaping Use   Vaping Use: Every day   Substances: Nicotine, Flavoring  Substance Use Topics   Alcohol use: No    Alcohol/week: 0.0 standard drinks of alcohol    Comment: rarely   Drug use: No     Allergies   Methotrexate derivatives, Peanut oil, Peanut-containing drug products, Trexall [methotrexate], Robaxin [methocarbamol], Egg white (egg protein), Eggs or egg-derived products, Norco [hydrocodone-acetaminophen], and Plaquenil [hydroxychloroquine]   Review of Systems Review of Systems  Skin:        Possible abscess     Physical Exam Triage Vital Signs ED Triage Vitals [02/10/22 1912]  Enc Vitals Group     BP 123/74     Pulse Rate 94     Resp 18     Temp 98.2 F (36.8 C)     Temp Source Oral     SpO2 96 %     Weight      Height      Head Circumference      Peak Flow      Pain Score      Pain Loc      Pain Edu?      Excl. in [place]?    No data found.  Updated Vital Signs BP 123/74 (BP Location: Right Arm)   Pulse 94   Temp 98.2 F (36.8 C) (Oral)   Resp 18   SpO2 96%   Visual Acuity Right Eye Distance:   Left Eye Distance:   Bilateral Distance:    Right Eye Near:   Left Eye Near:    Bilateral Near:     Physical Exam Vitals and nursing note reviewed. Exam conducted with a chaperone present.  Constitutional:      Appearance: Normal appearance.  HENT:     Head:  Normocephalic and atraumatic.  Cardiovascular:     Rate and Rhythm: Normal rate.  Pulmonary:     Effort: Pulmonary effort is normal.  Genitourinary:   Skin:    General: Skin is warm and dry.  Neurological:     General: No focal deficit  present.     Mental Status: He is alert and oriented to person, place, and time.  Psychiatric:        Mood and Affect: Mood normal.        Behavior: Behavior normal.      UC Treatments / Results  Labs (all labs ordered are listed, but only abnormal results are displayed) Labs Reviewed - No data to display  EKG   Radiology No results found.  Procedures Procedures (including critical care time)  Medications Ordered in UC Medications - No data to display  Initial Impression / Assessment and Plan / UC Course  I have reviewed the triage vital signs and the nursing notes.  Pertinent labs & imaging results that were available during my care of the patient were reviewed by me and considered in my medical decision making (see chart for details).     Reviewed exam and symptoms with patient.  No red flags on exam. Nonfluctuant abscess perirectal, no indication for drainage at this time.  Start Bactrim and warm compresses Lidocaine topically as needed for pain PCP follow-up 2 to 3 days for recheck ER precautions reviewed and patient verbalized understanding Final Clinical Impressions(s) / UC Diagnoses   Final diagnoses:  None   Discharge Instructions   None    ED Prescriptions   None    PDMP not reviewed this encounter.   Radford Pax, NP 02/10/22 1924

## 2022-02-13 ENCOUNTER — Ambulatory Visit
Admission: EM | Admit: 2022-02-13 | Discharge: 2022-02-13 | Disposition: A | Discharge status: Home / Self Care | Payer: Commercial Managed Care - PPO | Attending: Urgent Care | Admitting: Urgent Care

## 2022-02-13 DIAGNOSIS — K611 Rectal abscess: Secondary | ICD-10-CM | POA: Diagnosis not present

## 2022-02-13 MED ORDER — NAPROXEN 500 MG PO TABS: 500.0000 mg | ORAL_TABLET | Freq: Two times a day (BID) | ORAL | 0 refills | Status: AC

## 2022-02-13 MED ORDER — AMOXICILLIN-POT CLAVULANATE 875-125 MG PO TABS: 1.0000 | ORAL_TABLET | Freq: Two times a day (BID) | ORAL | 0 refills | Status: DC

## 2022-02-13 NOTE — ED Provider Notes (Signed)
[facility]  Note:  This document was prepared using Systems analyst and may include unintentional dictation errors.  MRN: [id number] DOB: 05-09-1995  Subjective:   Michael Lam is a 27 y.o. male presenting for recheck on perirectal abscess.  Was seen and treated for perirectal abscess 02/10/2022.  Patient was to start Bactrim.  Feels that he is worse.  Had an incision and drainage.  Has a history of pilonidal cyst.  No current facility-administered medications for this encounter.  Current Outpatient Medications:    acetaminophen (TYLENOL) 650 MG CR tablet, Take 650 mg by mouth every 8 (eight) hours as needed for pain., Disp: , Rfl:    aspirin-acetaminophen-caffeine (EXCEDRIN MIGRAINE) 250-250-65 MG tablet, Take 2 tablets by mouth every 6 (six) hours as needed for headache or migraine., Disp: , Rfl:    buPROPion (WELLBUTRIN XL) 150 MG 24 hr tablet, Take 1 tablet (150 mg total) by mouth daily., Disp: 90 tablet, Rfl: 1   fexofenadine (ALLEGRA) 180 MG tablet, Take 180 mg by mouth daily., Disp: , Rfl:    ibuprofen (ADVIL) 200 MG tablet, Take 400 mg by mouth every 6 (six) hours as needed for mild pain or moderate pain., Disp: , Rfl:    ketoconazole (NIZORAL) 2 % cream, Apply 1 Application topically daily. For two weeks, Disp: 15 g, Rfl: 0   lidocaine (XYLOCAINE) 5 % ointment, Apply 1 Application topically 4 (four) times daily as needed., Disp: 35.44 g, Rfl: 0   ondansetron (ZOFRAN-ODT) 4 MG disintegrating tablet, Take 1 tablet (4 mg total) by mouth every 8 (eight) hours as needed for nausea or vomiting., Disp: 20 tablet, Rfl: 0   oxyCODONE-acetaminophen (PERCOCET/ROXICET) 5-325 MG tablet, Take 1 tablet by mouth every 6 (six) hours as needed for pain for up to 5 days, Disp: 5 tablet, Rfl: 0   phentermine (ADIPEX-P) 37.5 MG tablet, Take 1 tablet (37.5 mg total) by mouth daily., Disp: 60 tablet, Rfl: 0   Semaglutide-Weight Management (WEGOVY)  0.25 MG/0.5ML SOAJ, Inject 0.25 mg into the skin once a week. (Patient not taking: Reported on 01/08/2022), Disp: 2 mL, Rfl: 0   senna-docusate (SENOKOT-S) 8.6-50 MG tablet, Take 1 tablet by mouth at bedtime as needed for mild constipation or moderate constipation., Disp: 20 tablet, Rfl: 0   sulfamethoxazole-trimethoprim (BACTRIM DS) 800-160 MG tablet, Take 1 tablet by mouth 2 (two) times daily for 7 days., Disp: 14 tablet, Rfl: 0   zolpidem (AMBIEN) 10 MG tablet, Take 1 tablet (10 mg total) by mouth at bedtime as needed for sleep., Disp: 30 tablet, Rfl: 1   Allergies  Allergen Reactions   Methotrexate Derivatives Hives, Itching and Other (See Comments)    Itchy rash on upper body and face after 1 dose   Peanut Oil Rash   Peanut-Containing Drug Products Rash   Trexall [Methotrexate] Hives, Itching and Other (See Comments)    Itchy rash on upper body and face after 1 dose   Robaxin [Methocarbamol] Other (See Comments)    Causes headache, fever, dizziness    Egg White (Egg Protein) Other (See Comments)    Abdominal Pain   Eggs Or Egg-Derived Products Other (See Comments)    Abdominal Pain   Norco [Hydrocodone-Acetaminophen] Itching, Rash and Other (See Comments)    Rash on thigh and upper body with itching   Plaquenil [Hydroxychloroquine] Itching, Rash and Other (See Comments)    Entire body itches    Past Medical History:  Diagnosis Date   Allergy  Anxiety    Asthma    Pilonidal cyst    Recurrent upper respiratory infection (URI)      Past Surgical History:  Procedure Laterality Date   ADENOIDECTOMY     SKIN BIOPSY  10/07/2018   right 5th digit.    TONSILLECTOMY      Family History  Problem Relation Age of Onset   Thyroid disease Mother    Chiari malformation Mother    Hyperlipidemia Father    Hypertension Father    Diabetes Father    Rheum arthritis Father    Other Father        alpha thylosemia   Fibromyalgia Father    Lupus Sister    Thyroid disease Brother     Diabetes Maternal Grandmother    Migraines Maternal Grandmother    Rheum arthritis Maternal Grandmother    Stroke Maternal Grandfather    Hypertension Maternal Grandfather    Hypertension Paternal Grandmother    Asthma Paternal Grandmother    Migraines Paternal Grandmother    Stroke Paternal Grandfather     Social History   Tobacco Use   Smoking status: Never    Passive exposure: Never   Smokeless tobacco: Never   Tobacco comments:    Pt currently vapes.  Has nicotine.  Vaping Use   Vaping Use: Every day   Substances: Nicotine, Flavoring  Substance Use Topics   Alcohol use: No    Alcohol/week: 0.0 standard drinks of alcohol    Comment: rarely   Drug use: No    ROS   Objective:   Vitals: There were no vitals taken for this visit.  Physical Exam Constitutional:      General: He is not in acute distress.    Appearance: Normal appearance. He is well-developed and normal weight. He is not ill-appearing, toxic-appearing or diaphoretic.  HENT:     Head: Normocephalic and atraumatic.     Right Ear: External ear normal.     Left Ear: External ear normal.     Nose: Nose normal.     Mouth/Throat:     Pharynx: Oropharynx is clear.  Eyes:     General: No scleral icterus.       Right eye: No discharge.        Left eye: No discharge.     Extraocular Movements: Extraocular movements intact.  Cardiovascular:     Rate and Rhythm: Normal rate.  Pulmonary:     Effort: Pulmonary effort is normal.  Genitourinary:   Musculoskeletal:     Cervical back: Normal range of motion.  Neurological:     Mental Status: He is alert and oriented to person, place, and time.  Psychiatric:        Mood and Affect: Mood normal.        Behavior: Behavior normal.        Thought Content: Thought content normal.        Judgment: Judgment normal.    PROCEDURE NOTE: I&D of Abscess Verbal consent obtained. Local anesthesia with 3cc of 2% lidocaine with epinephrine. Site cleansed with  Betadine and alcohol swabs. Incision of <1/2cm was made using an 11 blade, 5cc expressed consisting of a mixture of pus and serosanguinous fluid. Wound cavity was explored with curved hemostats and loculations loosened. Cleansed and dressed.   Assessment and Plan :   PDMP not reviewed this encounter.  1. Perirectal abscess     Stop Bactrim. Successful I&D performed.  Wound care reviewed.  Start Augmentin for better anaerobic  coverage for the abscess, naproxen for pain and inflammation. Counseled patient on potential for adverse effects with medications prescribed/recommended today, ER and return-to-clinic precautions discussed, patient verbalized understanding.    Wallis Bamberg, [place] 02/13/22 1531

## 2022-02-13 NOTE — ED Triage Notes (Signed)
Pt states he was seen for peri rectal abscess-states he is taking abx-area is worse-NAD-slow gait

## 2022-02-13 NOTE — Discharge Instructions (Signed)
Please change your dressing 3-5 times daily. Do not apply any ointments or creams. Each time you change your dressing, make sure that you are pressing on the wound to get pus to come out.  Try your best to clean the wound with antibacterial soap and warm water. Pat your wound dry and let it air out if possible to make sure it is dry before reapplying another dressing using rolled 4x4 gauze. Secure them the gauze roll with boxer briefs.   Stop Bactrim, start Augmentin for the infection. Use naproxen for the pain.

## 2022-03-27 ENCOUNTER — Ambulatory Visit (INDEPENDENT_AMBULATORY_CARE_PROVIDER_SITE_OTHER): Payer: Commercial Managed Care - PPO | Admitting: Nurse Practitioner

## 2022-03-27 ENCOUNTER — Encounter: Payer: Self-pay | Admitting: Nurse Practitioner

## 2022-03-27 VITALS — BP 116/80 | HR 101 | Temp 98.7°F | Resp 16 | Ht 71.0 in | Wt 218.1 lb

## 2022-03-27 DIAGNOSIS — R41840 Attention and concentration deficit: Secondary | ICD-10-CM | POA: Diagnosis not present

## 2022-03-27 DIAGNOSIS — L409 Psoriasis, unspecified: Secondary | ICD-10-CM

## 2022-03-27 DIAGNOSIS — R110 Nausea: Secondary | ICD-10-CM | POA: Diagnosis not present

## 2022-03-27 DIAGNOSIS — Z760 Encounter for issue of repeat prescription: Secondary | ICD-10-CM | POA: Diagnosis not present

## 2022-03-27 MED ORDER — TRIAMCINOLONE ACETONIDE 0.1 % EX CREA: 1.0000 | TOPICAL_CREAM | Freq: Two times a day (BID) | CUTANEOUS | 0 refills | Status: DC

## 2022-03-27 MED ORDER — ONDANSETRON 4 MG PO TBDP: 4.0000 mg | ORAL_TABLET | Freq: Three times a day (TID) | ORAL | 1 refills | Status: DC | PRN

## 2022-03-27 NOTE — Assessment & Plan Note (Signed)
Patient states he feels like he has a concentration deficit unsure if he is tried nonstimulants before.  States clonidine sounds removed.  States he has tried Adderall in the past that seem to be beneficial.  No official testing on file ambulatory referral to psychology to be tested.  Follow-up thereafter for medication management

## 2022-03-27 NOTE — Patient Instructions (Signed)
Nice to see you today I have sent in the nausea pills and a steroid cream for your hands. Use it for a week only at a time Follow up if no improvement

## 2022-03-27 NOTE — Assessment & Plan Note (Signed)
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  Uses Zofran on a as needed basis.  QTc recently checked with EKG within normal limits.  Refill provided

## 2022-03-27 NOTE — Progress Notes (Signed)
Established Patient Office Visit  Subjective   Patient ID: Michael Lam, male    DOB: 06-07-1995  Age: 27 y.o. MRN: [id number]  Chief Complaint  Patient presents with   Anxiety      ADD states that he feels like he is having trouble focusing. States that growing up he had trouble in school and had a lot of redirection. States that he is on welbutrion for anxiety and has not felt a difference. Never been tested. States that he may have tried clonidine but not sure. No straetta or other stimulant use  States that he has tried adderall in the past that was not prescribed but felt it beneficial. Thinks it was '30mg'$  IR  Skin issue: Patient has had psoriasis in the past.  He was followed by allergy asthma at one point was can be placed on the injectable medication.  Patient is having outbreak on his hand and what he believes is beneath his toes.  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  States he does use Dove body wash and uses a moisturizer.   Review of Systems  Constitutional:  Negative for chills and fever.  Respiratory:  Negative for shortness of breath.   Cardiovascular:  Negative for chest pain, palpitations and leg swelling.  Neurological:  Negative for headaches.  Psychiatric/Behavioral:  Negative for hallucinations and suicidal ideas. The patient does not have insomnia.       Objective:     BP 116/80   Pulse (!) 101   Temp 98.7 F (37.1 C)   Resp 16   Ht '5\' 11"'$  (1.803 m)   Wt 218 lb 2 oz (98.9 kg)   SpO2 97%   BMI 30.42 kg/m  BP Readings from Last 3 Encounters:  03/27/22 116/80  02/13/22 122/78  02/10/22 123/74   Wt Readings from Last 3 Encounters:  03/27/22 218 lb 2 oz (98.9 kg)  01/08/22 235 lb (106.6 kg)  12/18/21 230 lb 9.6 oz (104.6 kg)      Physical Exam Vitals and nursing note reviewed.  Constitutional:      Appearance: Normal appearance. He is obese.   Cardiovascular:     Rate and Rhythm: Normal rate and regular rhythm.     Heart sounds: Normal heart sounds.  Pulmonary:     Effort: Pulmonary effort is normal.     Breath sounds: Normal breath sounds.  Skin:    General: Skin is warm.     Findings: Rash present.       Neurological:     Mental Status: He is alert.      No results found for any visits on 03/27/22.    The ASCVD Risk score (Arnett DK, et al., 2019) failed to calculate for the following reasons:   The 2019 ASCVD risk score is only valid for ages 65 to 66    Assessment & Plan:   Problem List Items Addressed This Visit       Musculoskeletal and Integument   Psoriasis    Patient has a history of the same.  Was being followed by allergy asthma at one point was going to some type of injection patient is unsure the drug name.  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  Uses Zofran on a as needed basis.  QTc recently checked with EKG within normal limits.  Refill provided      Relevant Medications   ondansetron (ZOFRAN-ODT) 4 MG disintegrating tablet   Concentration deficit    Patient states he feels like he has a concentration deficit unsure if he is tried nonstimulants before.  States clonidine sounds removed.  States he has tried Adderall in the past that seem to be beneficial.  No official testing on file ambulatory referral to psychology to be tested.  Follow-up thereafter for medication management      Relevant Orders   Ambulatory referral to Psychology   Other Visit Diagnoses     Medication refill           Return if  symptoms worsen or fail to improve.    Romilda Garret, NP

## 2022-03-27 NOTE — Assessment & Plan Note (Signed)
Patient has a history of the same.  Was being followed by allergy asthma at one point was going to some type of injection patient is unsure the drug name.  He has used triamcinolone in the past will send in triamcinolone 0.1% cream twice daily for 7 days steroid precautions reviewed inclusive of hypopigmentation and skin thinning encourage patient to use moisture rising lotion in between

## 2022-03-31 ENCOUNTER — Encounter: Payer: Self-pay | Admitting: Nurse Practitioner

## 2022-03-31 DIAGNOSIS — R41840 Attention and concentration deficit: Secondary | ICD-10-CM

## 2022-04-01 NOTE — Telephone Encounter (Signed)
Is it true that the ADHD testing is booked out until september

## 2022-04-08 ENCOUNTER — Ambulatory Visit
Admission: EM | Admit: 2022-04-08 | Discharge: 2022-04-08 | Disposition: A | Discharge status: Home / Self Care | Payer: Commercial Managed Care - PPO | Attending: Nurse Practitioner | Admitting: Nurse Practitioner

## 2022-04-08 DIAGNOSIS — Z113 Encounter for screening for infections with a predominantly sexual mode of transmission: Secondary | ICD-10-CM

## 2022-04-08 DIAGNOSIS — M545 Low back pain, unspecified: Secondary | ICD-10-CM | POA: Diagnosis not present

## 2022-04-08 DIAGNOSIS — R369 Urethral discharge, unspecified: Secondary | ICD-10-CM | POA: Diagnosis not present

## 2022-04-08 LAB — POCT URINALYSIS DIP (MANUAL ENTRY)
Bilirubin, UA: NEGATIVE
Blood, UA: NEGATIVE
Glucose, UA: NEGATIVE mg/dL
Ketones, POC UA: NEGATIVE mg/dL
Nitrite, UA: NEGATIVE
Protein Ur, POC: NEGATIVE mg/dL
Spec Grav, UA: 1.02 (ref 1.010–1.025)
Urobilinogen, UA: 0.2 E.U./dL
pH, UA: 6 (ref 5.0–8.0)

## 2022-04-08 MED ORDER — DOXYCYCLINE HYCLATE 100 MG PO CAPS: 100.0000 mg | ORAL_CAPSULE | Freq: Two times a day (BID) | ORAL | 0 refills | Status: AC

## 2022-04-08 NOTE — ED Provider Notes (Signed)
[facility]    CSN: [id number] Arrival date & time: 04/08/22  [id number]      History   Chief Complaint Chief Complaint  Patient presents with   Penile Discharge   Back Pain    HPI Michael Lam is a 27 y.o. male presents for evaluation of penile discharge and dysuria.  Patient reports this morning he developed some white penile discharge with burning with urination.  He has endorses some right lower back pain.  Denies urinary urgency, frequency, hematuria, fevers, nausea/vomiting, flank pain.  No testicular pain or swelling.  No known STD exposure.  No history of STDs in the past.  No history of UTIs.  He has not taken any OTC medications.  No other concerns at this time.   Penile Discharge  Back Pain Associated symptoms: dysuria     Past Medical History:  Diagnosis Date   Allergy    Anxiety    Asthma    Pilonidal cyst    Recurrent upper respiratory infection (URI)     Patient Active Problem List   Diagnosis Date Noted   Concentration deficit 03/27/2022   Psoriasis 03/27/2022   Palpitations 01/08/2022   Preventative health care 11/26/2021   Class 1 obesity due to excess calories without serious comorbidity with body mass index (BMI) of 32.0 to 32.9 in adult 11/26/2021   Non-restorative sleep 11/26/2021   Rheumatoid arthritis of multiple sites with negative rheumatoid factor ([place]) 11/23/2018   Family history of rheumatoid arthritis 11/23/2018   Chronic nonintractable headache 02/02/2018   Nausea 02/02/2018   Chronic allergic rhinitis 11/24/2017   Shift work sleep disorder 07/06/2017   Obstructive sleep apnea 07/06/2017   History of hypoglycemia 07/03/2017   Ganglion cyst of foot 06/17/2017   History of asthma 05/19/2017   Chronic migraine without aura without status migrainosus, not intractable [id number]   Other complicated headache syndrome 12/09/2016   Rash 05/25/2015   Anxiety 04/13/2015    Past Surgical History:  Procedure Laterality  Date   ADENOIDECTOMY     SKIN BIOPSY  10/07/2018   right 5th digit.    TONSILLECTOMY         Home Medications    Prior to Admission medications   Medication Sig Start Date End Date Taking? Authorizing Provider  doxycycline (VIBRAMYCIN) 100 MG capsule Take 1 capsule (100 mg total) by mouth 2 (two) times daily for 7 days. 04/08/22 04/15/22 Yes Melynda Ripple, NP  acetaminophen (TYLENOL) 650 MG CR tablet Take 650 mg by mouth every 8 (eight) hours as needed for pain.    [provider]  aspirin-acetaminophen-caffeine (EXCEDRIN MIGRAINE) [phone] MG tablet Take 2 tablets by mouth every 6 (six) hours as needed for headache or migraine.    [provider]  buPROPion (WELLBUTRIN XL) 150 MG 24 hr tablet Take 1 tablet (150 mg total) by mouth daily. 02/04/22   Michela Pitcher, NP  fexofenadine (ALLEGRA) 180 MG tablet Take 180 mg by mouth daily.    [provider]  ibuprofen (ADVIL) 200 MG tablet Take 400 mg by mouth every 6 (six) hours as needed for mild pain or moderate pain.    [provider]  ketoconazole (NIZORAL) 2 % cream Apply 1 Application topically daily. For two weeks 01/08/22   Michela Pitcher, NP  lidocaine (XYLOCAINE) 5 % ointment Apply 1 Application topically 4 (four) times daily as needed. 02/10/22   Melynda Ripple, NP  naproxen (NAPROSYN) 500 MG tablet Take 1 tablet (500 mg  total) by mouth 2 (two) times daily with a meal. 02/13/22   Jaynee Eagles, PA-C  ondansetron (ZOFRAN-ODT) 4 MG disintegrating tablet Take 1 tablet (4 mg total) by mouth every 8 (eight) hours as needed for nausea or vomiting. 03/27/22   Michela Pitcher, NP  senna-docusate (SENOKOT-S) 8.6-50 MG tablet Take 1 tablet by mouth at bedtime as needed for mild constipation or moderate constipation. 09/20/21   Long, Wonda Olds, MD  triamcinolone cream (KENALOG) 0.1 % Apply 1 Application topically 2 (two) times daily. 03/27/22   Michela Pitcher, NP  zolpidem (AMBIEN) 10 MG tablet Take 1 tablet (10 mg total)  by mouth at bedtime as needed for sleep. 12/18/21 02/16/22  Laurin Coder, MD    Family History Family History  Problem Relation Age of Onset   Thyroid disease Mother    Chiari malformation Mother    Hyperlipidemia Father    Hypertension Father    Diabetes Father    Rheum arthritis Father    Other Father        alpha thylosemia   Fibromyalgia Father    Lupus Sister    Thyroid disease Brother    Diabetes Maternal Grandmother    Migraines Maternal Grandmother    Rheum arthritis Maternal Grandmother    Stroke Maternal Grandfather    Hypertension Maternal Grandfather    Hypertension Paternal Grandmother    Asthma Paternal Grandmother    Migraines Paternal Grandmother    Stroke Paternal Grandfather     Social History Social History   Tobacco Use   Smoking status: Never    Passive exposure: Never   Smokeless tobacco: Never   Tobacco comments:    Pt currently vapes.  Has nicotine.  Vaping Use   Vaping Use: Every day   Substances: Nicotine, Flavoring  Substance Use Topics   Alcohol use: Yes    Comment: occ   Drug use: No     Allergies   Methotrexate derivatives, Peanut oil, Peanut-containing drug products, Trexall [methotrexate], Robaxin [methocarbamol], Egg white (egg protein), Egg-derived products, Norco [hydrocodone-acetaminophen], and Plaquenil [hydroxychloroquine]   Review of Systems Review of Systems  Genitourinary:  Positive for dysuria and penile discharge.  Musculoskeletal:  Positive for back pain.     Physical Exam Triage Vital Signs ED Triage Vitals  Enc Vitals Group     BP 04/08/22 0904 131/76     Pulse Rate 04/08/22 0904 86     Resp 04/08/22 0904 18     Temp 04/08/22 0904 98 F (36.7 C)     Temp Source 04/08/22 0904 Oral     SpO2 04/08/22 0904 99 %     Weight --      Height --      Head Circumference --      Peak Flow --      Pain Score 04/08/22 0903 4     Pain Loc --      Pain Edu? --      Excl. in [place]? --    No data  found.  Updated Vital Signs BP 131/76 (BP Location: Left Arm)   Pulse 86   Temp 98 F (36.7 C) (Oral)   Resp 18   SpO2 99%   Visual Acuity Right Eye Distance:   Left Eye Distance:   Bilateral Distance:    Right Eye Near:   Left Eye Near:    Bilateral Near:     Physical Exam Vitals and nursing note reviewed.  Constitutional:  Appearance: Normal appearance.  HENT:     Head: Normocephalic and atraumatic.  Eyes:     Pupils: Pupils are equal, round, and reactive to light.  Cardiovascular:     Rate and Rhythm: Normal rate.  Pulmonary:     Effort: Pulmonary effort is normal.  Abdominal:     Tenderness: There is no right CVA tenderness or left CVA tenderness.  Musculoskeletal:     Thoracic back: Normal.     Lumbar back: Spasms and tenderness present. No swelling, edema, deformity, signs of trauma, lacerations or bony tenderness. Normal range of motion. No scoliosis.       Back:  Skin:    General: Skin is warm and dry.  Neurological:     General: No focal deficit present.     Mental Status: He is alert and oriented to person, place, and time.  Psychiatric:        Mood and Affect: Mood normal.        Behavior: Behavior normal.      UC Treatments / Results  Labs (all labs ordered are listed, but only abnormal results are displayed) Labs Reviewed  POCT URINALYSIS DIP (MANUAL ENTRY) - Abnormal; Notable for the following components:      Result Value   Clarity, UA cloudy (*)    Leukocytes, UA Trace (*)    All other components within normal limits  URINE CULTURE  HIV ANTIBODY (ROUTINE TESTING W REFLEX)  RPR  CYTOLOGY, (ORAL, ANAL, URETHRAL) ANCILLARY ONLY    EKG   Radiology No results found.  Procedures Procedures (including critical care time)  Medications Ordered in UC Medications - No data to display  Initial Impression / Assessment and Plan / UC Course  I have reviewed the triage vital signs and the nursing notes.  Pertinent labs & imaging  results that were available during my care of the patient were reviewed by me and considered in my medical decision making (see chart for details).     Reviewed exam and symptoms with patient.  No red flags STD testing as ordered and will contact for any positive results.  Will start doxycycline given symptoms Urine culture.  Doxycycline will cover UTIs as well Discussed low back pain/muscle spasm.  Instructed use over-the-counter Tylenol or ibuprofen as well as heat Follow-up with PCP if symptoms do not improve ER precautions reviewed and patient verbalized understanding Final Clinical Impressions(s) / UC Diagnoses   Final diagnoses:  Screening examination for STD (sexually transmitted disease)  Penile discharge  Acute left-sided low back pain without sciatica     Discharge Instructions      Start doxycycline twice daily for 7 days The clinic will contact you with results of the testing done today if anything is positive Rest and fluids Use over-the-counter ibuprofen or Tylenol as needed for your low back pain Please follow-up with your PCP if your symptoms or not improving Please go to the ER for any worsening symptoms    ED Prescriptions     Medication Sig Dispense Auth. Provider   doxycycline (VIBRAMYCIN) 100 MG capsule Take 1 capsule (100 mg total) by mouth 2 (two) times daily for 7 days. 14 capsule Melynda Ripple, NP      PDMP not reviewed this encounter.   Melynda Ripple, NP 04/08/22 [phone]

## 2022-04-08 NOTE — Discharge Instructions (Addendum)
Start doxycycline twice daily for 7 days The clinic will contact you with results of the testing done today if anything is positive Rest and fluids Use over-the-counter ibuprofen or Tylenol as needed for your low back pain Please follow-up with your PCP if your symptoms or not improving Please go to the ER for any worsening symptoms

## 2022-04-08 NOTE — ED Triage Notes (Signed)
Patient noticed burning with urination and penile discharge (white) that began this morning. Patient c/o lower back pain. Patient is requesting STD testing (including blood work).

## 2022-04-09 LAB — CYTOLOGY, (ORAL, ANAL, URETHRAL) ANCILLARY ONLY
Chlamydia: NEGATIVE
Comment: NEGATIVE
Comment: NEGATIVE
Comment: NORMAL
Neisseria Gonorrhea: NEGATIVE
Trichomonas: NEGATIVE

## 2022-04-09 LAB — URINE CULTURE: Culture: NO GROWTH

## 2022-04-09 LAB — RPR: RPR Ser Ql: NONREACTIVE

## 2022-04-09 LAB — HIV ANTIBODY (ROUTINE TESTING W REFLEX): HIV Screen 4th Generation wRfx: NONREACTIVE

## 2022-05-02 ENCOUNTER — Emergency Department (HOSPITAL_COMMUNITY): Payer: Commercial Managed Care - PPO

## 2022-05-02 ENCOUNTER — Emergency Department (HOSPITAL_COMMUNITY)
Admission: EM | Admit: 2022-05-02 | Discharge: 2022-05-02 | Disposition: A | Discharge status: Home / Self Care | Payer: Commercial Managed Care - PPO | Attending: Emergency Medicine | Admitting: Emergency Medicine

## 2022-05-02 DIAGNOSIS — R55 Syncope and collapse: Secondary | ICD-10-CM | POA: Diagnosis present

## 2022-05-02 DIAGNOSIS — Z79899 Other long term (current) drug therapy: Secondary | ICD-10-CM | POA: Insufficient documentation

## 2022-05-02 DIAGNOSIS — R0789 Other chest pain: Secondary | ICD-10-CM | POA: Insufficient documentation

## 2022-05-02 DIAGNOSIS — R42 Dizziness and giddiness: Secondary | ICD-10-CM | POA: Insufficient documentation

## 2022-05-02 DIAGNOSIS — Z91010 Allergy to peanuts: Secondary | ICD-10-CM | POA: Diagnosis not present

## 2022-05-02 DIAGNOSIS — R110 Nausea: Secondary | ICD-10-CM | POA: Diagnosis not present

## 2022-05-02 DIAGNOSIS — R0602 Shortness of breath: Secondary | ICD-10-CM | POA: Diagnosis not present

## 2022-05-02 DIAGNOSIS — R000 Tachycardia, unspecified: Secondary | ICD-10-CM

## 2022-05-02 LAB — URINALYSIS, ROUTINE W REFLEX MICROSCOPIC
Bilirubin Urine: NEGATIVE
Glucose, UA: NEGATIVE mg/dL
Hgb urine dipstick: NEGATIVE
Ketones, ur: 20 mg/dL — AB
Leukocytes,Ua: NEGATIVE
Nitrite: NEGATIVE
Protein, ur: NEGATIVE mg/dL
Specific Gravity, Urine: 1.005 (ref 1.005–1.030)
pH: 6 (ref 5.0–8.0)

## 2022-05-02 LAB — COMPREHENSIVE METABOLIC PANEL
ALT: 22 U/L (ref 0–44)
AST: 22 U/L (ref 15–41)
Albumin: 4.4 g/dL (ref 3.5–5.0)
Alkaline Phosphatase: 40 U/L (ref 38–126)
Anion gap: 10 (ref 5–15)
BUN: 10 mg/dL (ref 6–20)
CO2: 21 mmol/L — ABNORMAL LOW (ref 22–32)
Calcium: 9.1 mg/dL (ref 8.9–10.3)
Chloride: 106 mmol/L (ref 98–111)
Creatinine, Ser: 1.13 mg/dL (ref 0.61–1.24)
GFR, Estimated: >60 mL/min (ref >60)
Glucose, Bld: 78 mg/dL (ref 70–99)
Potassium: 3.6 mmol/L (ref 3.5–5.1)
Sodium: 137 mmol/L (ref 135–145)
Total Bilirubin: 0.8 mg/dL (ref 0.3–1.2)
Total Protein: 6.9 g/dL (ref 6.5–8.1)

## 2022-05-02 LAB — CBC WITH DIFFERENTIAL/PLATELET
Abs Immature Granulocytes: 0.03 10*3/uL (ref 0.00–0.07)
Basophils Absolute: 0 10*3/uL (ref 0.0–0.1)
Basophils Relative: 0 %
Eosinophils Absolute: 0.4 10*3/uL (ref 0.0–0.5)
Eosinophils Relative: 4 %
HCT: 41.2 % (ref 39.0–52.0)
Hemoglobin: 14.2 g/dL (ref 13.0–17.0)
Immature Granulocytes: 0 %
Lymphocytes Relative: 23 %
Lymphs Abs: 2 10*3/uL (ref 0.7–4.0)
MCH: 28.9 pg (ref 26.0–34.0)
MCHC: 34.5 g/dL (ref 30.0–36.0)
MCV: 83.7 fL (ref 80.0–100.0)
Monocytes Absolute: 0.8 10*3/uL (ref 0.1–1.0)
Monocytes Relative: 9 %
Neutro Abs: 5.7 10*3/uL (ref 1.7–7.7)
Neutrophils Relative %: 64 %
Platelets: 190 10*3/uL (ref 150–400)
RBC: 4.92 MIL/uL (ref 4.22–5.81)
RDW: 12.8 % (ref 11.5–15.5)
WBC: 9 10*3/uL (ref 4.0–10.5)
nRBC: 0 % (ref 0.0–0.2)

## 2022-05-02 LAB — D-DIMER, QUANTITATIVE: D-Dimer, Quant: <0.27 ug/mL-FEU (ref 0.00–0.50)

## 2022-05-02 LAB — RAPID URINE DRUG SCREEN, HOSP PERFORMED
Amphetamines: NOT DETECTED
Barbiturates: NOT DETECTED
Benzodiazepines: NOT DETECTED
Cocaine: NOT DETECTED
Opiates: NOT DETECTED
Tetrahydrocannabinol: NOT DETECTED

## 2022-05-02 LAB — TROPONIN I (HIGH SENSITIVITY)
Troponin I (High Sensitivity): <2 ng/L (ref <18)
Troponin I (High Sensitivity): 2 ng/L (ref <18)

## 2022-05-02 LAB — TSH: TSH: 1.01 u[IU]/mL (ref 0.350–4.500)

## 2022-05-02 MED ORDER — SODIUM CHLORIDE 0.9 % IV BOLUS
1000.0000 mL | Freq: Once | INTRAVENOUS | Status: AC
  Administered 2022-05-02: 1000 mL via INTRAVENOUS

## 2022-05-02 NOTE — Discharge Instructions (Signed)
Please read and follow all provided instructions.  Your diagnoses today include:  1. Syncope, unspecified syncope type   2. Sinus tachycardia     Tests performed today include: An EKG of your heart: Showed a fast rate without other abnormal rhythms A chest x-ray: Lungs were clear without enlarged heart or other problems Cardiac enzyme: Were normal no sign of stress on the heart Blood counts and electrolytes Thyroid testing was normal D-dimer screening test for blood clot was normal Vital signs. See below for your results today.   Medications prescribed:  None  Take any prescribed medications only as directed.  Follow-up instructions: Please follow-up with your primary care provider next week for reevaluation.   Return instructions:  SEEK IMMEDIATE MEDICAL ATTENTION IF: You have severe chest pain, especially if the pain is crushing or pressure-like and spreads to the arms, back, neck, or jaw, or if you have sweating, nausea or vomiting, or trouble with breathing. THIS IS AN EMERGENCY. Do not wait to see if the pain will go away. Get medical help at once. Call 911. DO NOT drive yourself to the hospital.  Your chest pain gets worse and does not go away after a few minutes of rest.  You have an attack of chest pain lasting longer than what you usually experience.  You have significant dizziness, if you pass out, or have trouble walking.  You have chest pain not typical of your usual pain for which you originally saw your caregiver.  You have any other emergent concerns regarding your health.  Additional Information: Chest pain comes from many different causes. Your caregiver has diagnosed you as having chest pain that is not specific for one problem, but does not require admission.  You are at low risk for an acute heart condition or other serious illness.   Your vital signs today were: BP 122/78 (BP Location: Left Arm)   Pulse 75   Temp 98.2 F (36.8 C) (Oral)   Resp 16   Ht 5'  11" (1.803 m)   Wt 98 kg   SpO2 100%   BMI 30.13 kg/m  If your blood pressure (BP) was elevated above 135/85 this visit, please have this repeated by your doctor within one month. --------------

## 2022-05-02 NOTE — ED Provider Notes (Signed)
[place] EMERGENCY DEPARTMENT AT [facility] Provider Note   CSN: [id number] Arrival date & time: 05/02/22  1758     History  Chief Complaint  Patient presents with   Loss of Consciousness    Michael Lam is a 27 y.o. male.  Patient presents to the emergency department today after having a syncopal episode.  Patient states that he awoke early this morning not feeling well.  He describes a sensation of vertigo with nausea.  He was able to fall back asleep.  When he woke up later in the morning he felt generally off.  He had a vague sense of some chest discomfort.  He went to the gym and try to work out, but states that he was unable to complete his activities.  He felt short of breath and had sensation of rapid heartbeat.  This afternoon he went to meet someone and drove.  He got out of the car and had a syncopal episode.  He awoke on the ground with people around him.  He denies severe headache.  Was found to have sinus tachycardia in the 130s.  He has never felt like this before.  He reports family history of several different cardiac issues including hypertension, murmurs.  He has not had any of these problems in the past.  No history of blood clots or DVT symptoms.  No strokelike symptoms (patient is a Engineer, civil (consulting)).  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       Home Medications Prior to Admission medications   Medication Sig Start Date End Date Taking? Authorizing Provider  acetaminophen (TYLENOL) 650 MG CR tablet Take 650 mg by mouth every 8 (eight) hours as needed for pain.    [provider]  aspirin-acetaminophen-caffeine (EXCEDRIN MIGRAINE) [phone] MG tablet Take 2 tablets by mouth every 6 (six) hours as needed for headache or migraine.    [provider]  buPROPion (WELLBUTRIN XL) 150 MG 24 hr tablet Take 1 tablet (150 mg total) by  mouth daily. 02/04/22   Eden Emms, NP  fexofenadine (ALLEGRA) 180 MG tablet Take 180 mg by mouth daily.    [provider]  ibuprofen (ADVIL) 200 MG tablet Take 400 mg by mouth every 6 (six) hours as needed for mild pain or moderate pain.    [provider]  ketoconazole (NIZORAL) 2 % cream Apply 1 Application topically daily. For two weeks 01/08/22   Eden Emms, NP  lidocaine (XYLOCAINE) 5 % ointment Apply 1 Application topically 4 (four) times daily as needed. 02/10/22   Radford Pax, NP  naproxen (NAPROSYN) 500 MG tablet Take 1 tablet (500 mg total) by mouth 2 (two) times daily with a meal. 02/13/22   Wallis Bamberg, PA-C  ondansetron (ZOFRAN-ODT) 4 MG disintegrating tablet Take 1 tablet (4 mg total) by mouth every 8 (eight) hours as needed for nausea or vomiting. 03/27/22   Eden Emms, NP  senna-docusate (SENOKOT-S) 8.6-50 MG tablet Take 1 tablet by mouth at bedtime as needed for mild constipation or moderate constipation. 09/20/21   Long, Arlyss Repress, MD  triamcinolone cream (KENALOG) 0.1 % Apply 1 Application topically 2 (two) times daily. 03/27/22   Eden Emms, NP  zolpidem (AMBIEN) 10 MG tablet Take 1 tablet (10 mg total) by mouth at bedtime as needed for sleep. 12/18/21 02/16/22  Olalere,  Adewale A, MD      Allergies    Methotrexate derivatives, Peanut oil, Peanut-containing drug products, Trexall [methotrexate], Robaxin [methocarbamol], Egg white (egg protein), Egg-derived products, Norco [hydrocodone-acetaminophen], and Plaquenil [hydroxychloroquine]    Review of Systems   Review of Systems  Physical Exam Updated Vital Signs BP 128/74 (BP Location: Left Arm)   Pulse (!) 118   Temp 98.2 F (36.8 C) (Oral)   Resp (!) 22   Ht 5\' 11"  (1.803 m)   Wt 98 kg   SpO2 100%   BMI 30.13 kg/m   Physical Exam Vitals and nursing note reviewed.  Constitutional:      Appearance: He is well-developed. He is not diaphoretic.  HENT:     Head: Normocephalic and  atraumatic.     Right Ear: External ear normal.     Left Ear: External ear normal.     Nose: Nose normal.     Mouth/Throat:     Mouth: Mucous membranes are moist. Mucous membranes are not dry.  Eyes:     Conjunctiva/sclera: Conjunctivae normal.  Neck:     Vascular: Normal carotid pulses. No carotid bruit or JVD.     Trachea: Trachea normal. No tracheal deviation.  Cardiovascular:     Rate and Rhythm: Regular rhythm. Tachycardia present.     Pulses: No decreased pulses.          Radial pulses are 2+ on the right side and 2+ on the left side.     Heart sounds: Normal heart sounds, S1 normal and S2 normal. Heart sounds not distant. No murmur heard.    Comments: Slightly quiet heart sounds, regular Pulmonary:     Effort: Pulmonary effort is normal. No respiratory distress.     Breath sounds: Normal breath sounds. No wheezing.     Comments: Lungs clear to auscultation bilaterally Chest:     Chest wall: No tenderness.  Abdominal:     Palpations: Abdomen is soft.     Tenderness: There is no abdominal tenderness. There is no guarding or rebound.  Musculoskeletal:     Cervical back: Normal range of motion and neck supple. No muscular tenderness.     Right lower leg: No edema.     Left lower leg: No edema.  Skin:    General: Skin is warm and dry.     Coloration: Skin is not pale.  Neurological:     Mental Status: He is alert. Mental status is at baseline.  Psychiatric:        Mood and Affect: Mood normal.     ED Results / Procedures / Treatments   Labs (all labs ordered are listed, but only abnormal results are displayed) Labs Reviewed  COMPREHENSIVE METABOLIC PANEL - Abnormal; Notable for the following components:      Result Value   CO2 21 (*)    All other components within normal limits  URINALYSIS, ROUTINE W REFLEX MICROSCOPIC - Abnormal; Notable for the following components:   Color, Urine STRAW (*)    Ketones, ur 20 (*)    All other components within normal limits  CBC  WITH DIFFERENTIAL/PLATELET  D-DIMER, QUANTITATIVE  TSH  RAPID URINE DRUG SCREEN, HOSP PERFORMED  TROPONIN I (HIGH SENSITIVITY)  TROPONIN I (HIGH SENSITIVITY)    EKG EKG Interpretation  Date/Time:  Friday May 02 2022 18:49:08 EDT Ventricular Rate:  108 PR Interval:  134 QRS Duration: 92 QT Interval:  334 QTC Calculation: 447 R Axis:   81 Text Interpretation: Sinus tachycardia  Otherwise normal ECG No previous ECGs available Confirmed by Kristine Royal [phone]) on 05/02/2022 8:46:58 PM  Radiology DG Chest 2 View  Result Date: 05/02/2022 CLINICAL DATA:  Chest pain. EXAM: CHEST - 2 VIEW COMPARISON:  December 28, 2017. FINDINGS: The heart size and mediastinal contours are within normal limits. Both lungs are clear. The visualized skeletal structures are unremarkable. IMPRESSION: No active cardiopulmonary disease. Electronically Signed   By: Lupita Raider M.D.   On: 05/02/2022 18:44    Procedures Procedures    Medications Ordered in ED Medications  sodium chloride 0.9 % bolus 1,000 mL (0 mLs Intravenous Stopped 05/02/22 2040)    ED Course/ Medical Decision Making/ A&P    Patient seen and examined. History obtained directly from patient.   Labs/EKG: Ordered CBC, CMP, troponin, D-dimer, TSH.  Imaging: Ordered chest x-ray.  Medications/Fluids: Ordered: IV fluid bolus.   Most recent vital signs reviewed and are as follows: BP 128/74 (BP Location: Left Arm)   Pulse (!) 118   Temp 98.2 F (36.8 C) (Oral)   Resp (!) 22   Ht 5\' 11"  (1.803 m)   Wt 98 kg   SpO2 100%   BMI 30.13 kg/m   Initial impression: syncope, tachycardia, chest discomfort  Patient work-up discussed with Dr. Rodena Medin who agrees with current plan.      Patient was reassessed several times during ED stay.  He has remained stable.  Patient appears well.  Symptoms have resolved.  Labs personally reviewed and interpreted including: CBC normal with normal hemoglobin; CMP unremarkable; D-dimer normal;  troponin normal and flat trend; TSH normal; UA with 20 ketones otherwise no compelling signs of infection, UDS unremarkable.  EKG reviewed and interpreted as above.  Orthostatic VS for the past 24 hrs:  BP- Lying Pulse- Lying BP- Sitting Pulse- Sitting BP- Standing at 0 minutes Pulse- Standing at 0 minutes  05/02/22 1920 114/74 110 116/76 114 95/62 109      Imaging personally visualized and interpreted including: Chest x-ray, agree negative.  Reviewed pertinent lab work and imaging with patient at bedside.  He feels comfortable with going home at this time.  Questions answered.   Most current vital signs reviewed and are as follows: BP 122/78 (BP Location: Left Arm)   Pulse 75   Temp 98.2 F (36.8 C) (Oral)   Resp 16   Ht 5\' 11"  (1.803 m)   Wt 98 kg   SpO2 100%   BMI 30.13 kg/m   Plan: Discharge to home.   Prescriptions written for: None  Other home care instructions discussed: Encouraged rest and hydration.    ED return instructions discussed: Return with development of worsening chest pain, shortness of breath, additional episodes of syncope, new symptoms or other concerns.  Follow-up instructions discussed: Patient encouraged to follow-up with their PCP in 3 days.                               Medical Decision Making Amount and/or Complexity of Data Reviewed Labs: ordered. Radiology: ordered.   Patient with low risk syncope.  Unclear etiology.  Did have some vague chest discomfort earlier today.  Sinus tachycardia noted on arrival, however improved during ED stay with IV fluids.  Complete workup is reassuring at this time.  He does have some family history.  No sign of prolonged QT, WPW, Brugada syndrome, heart block or other arrhythmia on EKG, no signs of hypertrophy on EKG or  chest x-ray.  The patient's vital signs, pertinent lab work and imaging were reviewed and interpreted as discussed in the ED course. Hospitalization was considered for further testing,  treatments, or serial exams/observation. However as patient is well-appearing, has a stable exam, and reassuring studies today, I do not feel that they warrant admission at this time. This plan was discussed with the patient who verbalizes agreement and comfort with this plan and seems reliable and able to return to the Emergency Department with worsening or changing symptoms.          Final Clinical Impression(s) / ED Diagnoses Final diagnoses:  Syncope, unspecified syncope type  Sinus tachycardia    Rx / DC Orders ED Discharge Orders     None         Renne Crigler, Cordelia Poche 05/02/22 2312    Wynetta Fines, MD 05/02/22 [phone]

## 2022-05-02 NOTE — ED Notes (Signed)
Patient updated on labs.

## 2022-05-02 NOTE — ED Notes (Signed)
Patient transported to X-ray 

## 2022-05-02 NOTE — ED Triage Notes (Signed)
Patient BIB EMS due to witness syncopal event. Patient states this has never happened before. Patient states he was getting out of his car and that's all he remembers. Patient states he's felt dizzy and had chest pressure today, but nothing that would've made him think he needed to come into the hospital. Patient A&Ox4.

## 2022-05-06 ENCOUNTER — Telehealth: Payer: Self-pay

## 2022-05-06 NOTE — Transitions of Care (Post Inpatient/ED Visit) (Signed)
   05/06/2022  Name: [employer] MRN: [id number] DOB: May 04, 1995  Today's TOC FU Call Status: Today's TOC FU Call Status:: Unsuccessul Call (1st Attempt) Unsuccessful Call (1st Attempt) Date: 05/06/22  Attempted to reach the patient regarding the most recent Inpatient/ED visit.  Follow Up Plan: Additional outreach attempts will be made to reach the patient to complete the Transitions of Care (Post Inpatient/ED visit) call.   Signature Lewanda Rife, LPN

## 2022-05-07 NOTE — Transitions of Care (Post Inpatient/ED Visit) (Signed)
   05/07/2022  Name: Michael Lam MRN: [id number] DOB: November 06, 1995  Today's TOC FU Call Status: Today's TOC FU Call Status:: Unsuccessful Call (2nd Attempt) Unsuccessful Call (1st Attempt) Date: 05/06/22 Unsuccessful Call (2nd Attempt) Date: 05/07/22  Attempted to reach the patient regarding the most recent Inpatient/ED visit.  Follow Up Plan: Additional outreach attempts will be made to reach the patient to complete the Transitions of Care (Post Inpatient/ED visit) call.   Signature  Donnamarie Poag, CMA

## 2022-05-08 NOTE — Transitions of Care (Post Inpatient/ED Visit) (Signed)
Pt was automatically removed from [facility] ED list due to pt seen ED on 05/02/22. Sending note to PCP; Audria Nine NP.     05/08/2022  Name: Ravindra Baranek MRN: [id number] DOB: 1995/09/24  Today's TOC FU Call Status: Today's TOC FU Call Status:: Unsuccessful Call (2nd Attempt) Unsuccessful Call (1st Attempt) Date: 05/06/22 Unsuccessful Call (2nd Attempt) Date: 05/07/22  Attempted to reach the patient regarding the most recent Inpatient/ED visit.  Follow Up Plan: No further outreach attempts will be made at this time. We have been unable to contact the patient.  Signature Lewanda Rife, LPN

## 2022-05-12 ENCOUNTER — Other Ambulatory Visit (HOSPITAL_COMMUNITY): Payer: Self-pay

## 2022-05-12 MED ORDER — AMPHETAMINE-DEXTROAMPHET ER 20 MG PO CP24
20.0000 mg | ORAL_CAPSULE | Freq: Every day | ORAL | 0 refills | Status: AC
  Filled 2022-05-12: qty 30, 30d supply, fill #0

## 2022-05-14 ENCOUNTER — Encounter: Payer: Self-pay | Admitting: Nurse Practitioner

## 2022-05-14 DIAGNOSIS — R110 Nausea: Secondary | ICD-10-CM

## 2022-05-14 DIAGNOSIS — L409 Psoriasis, unspecified: Secondary | ICD-10-CM

## 2022-05-15 ENCOUNTER — Other Ambulatory Visit (HOSPITAL_COMMUNITY): Payer: Self-pay

## 2022-05-15 MED ORDER — TRIAMCINOLONE ACETONIDE 0.1 % EX CREA
1.0000 | TOPICAL_CREAM | Freq: Two times a day (BID) | CUTANEOUS | 0 refills | Status: AC
  Filled 2022-05-15: qty 30, 15d supply, fill #0

## 2022-05-15 MED ORDER — ONDANSETRON 4 MG PO TBDP
4.0000 mg | ORAL_TABLET | Freq: Three times a day (TID) | ORAL | 1 refills | Status: DC | PRN
  Filled 2022-05-15: qty 20, 7d supply, fill #0
  Filled 2022-08-01 (×2): qty 20, 7d supply, fill #1

## 2022-05-29 ENCOUNTER — Emergency Department (HOSPITAL_BASED_OUTPATIENT_CLINIC_OR_DEPARTMENT_OTHER): Payer: Commercial Managed Care - PPO

## 2022-05-29 ENCOUNTER — Other Ambulatory Visit (HOSPITAL_BASED_OUTPATIENT_CLINIC_OR_DEPARTMENT_OTHER): Payer: Self-pay

## 2022-05-29 ENCOUNTER — Encounter (HOSPITAL_BASED_OUTPATIENT_CLINIC_OR_DEPARTMENT_OTHER): Payer: Self-pay

## 2022-05-29 ENCOUNTER — Observation Stay (HOSPITAL_BASED_OUTPATIENT_CLINIC_OR_DEPARTMENT_OTHER)
Admission: EM | Admit: 2022-05-29 | Discharge: 2022-05-30 | Disposition: A | Discharge status: Home / Self Care | Payer: Commercial Managed Care - PPO | Attending: Emergency Medicine | Admitting: Emergency Medicine

## 2022-05-29 ENCOUNTER — Ambulatory Visit
Admission: RE | Admit: 2022-05-29 | Discharge: 2022-05-29 | Disposition: A | Discharge status: Home / Self Care | Payer: Commercial Managed Care - PPO | Source: Ambulatory Visit | Attending: Nurse Practitioner | Admitting: Nurse Practitioner

## 2022-05-29 ENCOUNTER — Other Ambulatory Visit: Payer: Self-pay

## 2022-05-29 VITALS — BP 109/75 | HR 117 | Temp 99.7°F | Resp 17

## 2022-05-29 DIAGNOSIS — R1310 Dysphagia, unspecified: Secondary | ICD-10-CM | POA: Diagnosis not present

## 2022-05-29 DIAGNOSIS — K2289 Other specified disease of esophagus: Secondary | ICD-10-CM | POA: Diagnosis not present

## 2022-05-29 DIAGNOSIS — R0602 Shortness of breath: Secondary | ICD-10-CM | POA: Insufficient documentation

## 2022-05-29 DIAGNOSIS — E872 Acidosis, unspecified: Secondary | ICD-10-CM | POA: Diagnosis present

## 2022-05-29 DIAGNOSIS — Z823 Family history of stroke: Secondary | ICD-10-CM

## 2022-05-29 DIAGNOSIS — Z8349 Family history of other endocrine, nutritional and metabolic diseases: Secondary | ICD-10-CM

## 2022-05-29 DIAGNOSIS — R590 Localized enlarged lymph nodes: Secondary | ICD-10-CM | POA: Diagnosis not present

## 2022-05-29 DIAGNOSIS — J45909 Unspecified asthma, uncomplicated: Secondary | ICD-10-CM | POA: Insufficient documentation

## 2022-05-29 DIAGNOSIS — Z91010 Allergy to peanuts: Secondary | ICD-10-CM | POA: Insufficient documentation

## 2022-05-29 DIAGNOSIS — J982 Interstitial emphysema: Principal | ICD-10-CM | POA: Insufficient documentation

## 2022-05-29 DIAGNOSIS — Z8249 Family history of ischemic heart disease and other diseases of the circulatory system: Secondary | ICD-10-CM

## 2022-05-29 DIAGNOSIS — F17210 Nicotine dependence, cigarettes, uncomplicated: Secondary | ICD-10-CM | POA: Insufficient documentation

## 2022-05-29 DIAGNOSIS — Z79899 Other long term (current) drug therapy: Secondary | ICD-10-CM | POA: Diagnosis not present

## 2022-05-29 DIAGNOSIS — M542 Cervicalgia: Secondary | ICD-10-CM | POA: Diagnosis present

## 2022-05-29 DIAGNOSIS — Z91012 Allergy to eggs: Secondary | ICD-10-CM

## 2022-05-29 DIAGNOSIS — Z833 Family history of diabetes mellitus: Secondary | ICD-10-CM

## 2022-05-29 DIAGNOSIS — Z888 Allergy status to other drugs, medicaments and biological substances status: Secondary | ICD-10-CM

## 2022-05-29 DIAGNOSIS — Z832 Family history of diseases of the blood and blood-forming organs and certain disorders involving the immune mechanism: Secondary | ICD-10-CM

## 2022-05-29 DIAGNOSIS — F419 Anxiety disorder, unspecified: Secondary | ICD-10-CM | POA: Diagnosis present

## 2022-05-29 DIAGNOSIS — Z825 Family history of asthma and other chronic lower respiratory diseases: Secondary | ICD-10-CM

## 2022-05-29 DIAGNOSIS — Z8261 Family history of arthritis: Secondary | ICD-10-CM

## 2022-05-29 DIAGNOSIS — Z1152 Encounter for screening for COVID-19: Secondary | ICD-10-CM | POA: Insufficient documentation

## 2022-05-29 DIAGNOSIS — F909 Attention-deficit hyperactivity disorder, unspecified type: Secondary | ICD-10-CM | POA: Diagnosis present

## 2022-05-29 DIAGNOSIS — Z885 Allergy status to narcotic agent status: Secondary | ICD-10-CM

## 2022-05-29 DIAGNOSIS — Z83438 Family history of other disorder of lipoprotein metabolism and other lipidemia: Secondary | ICD-10-CM

## 2022-05-29 LAB — COMPREHENSIVE METABOLIC PANEL
ALT: 23 U/L (ref 0–44)
AST: 24 U/L (ref 15–41)
Albumin: 4.7 g/dL (ref 3.5–5.0)
Alkaline Phosphatase: 40 U/L (ref 38–126)
Anion gap: 13 (ref 5–15)
BUN: 14 mg/dL (ref 6–20)
CO2: 19 mmol/L — ABNORMAL LOW (ref 22–32)
Calcium: 9.8 mg/dL (ref 8.9–10.3)
Chloride: 105 mmol/L (ref 98–111)
Creatinine, Ser: 1.01 mg/dL (ref 0.61–1.24)
GFR, Estimated: >60 mL/min (ref >60)
Glucose, Bld: 87 mg/dL (ref 70–99)
Potassium: 4.2 mmol/L (ref 3.5–5.1)
Sodium: 137 mmol/L (ref 135–145)
Total Bilirubin: 0.7 mg/dL (ref 0.3–1.2)
Total Protein: 7.7 g/dL (ref 6.5–8.1)

## 2022-05-29 LAB — CBC WITH DIFFERENTIAL/PLATELET
Abs Immature Granulocytes: 0.01 10*3/uL (ref 0.00–0.07)
Basophils Absolute: 0 10*3/uL (ref 0.0–0.1)
Basophils Relative: 0 %
Eosinophils Absolute: 0.2 10*3/uL (ref 0.0–0.5)
Eosinophils Relative: 2 %
HCT: 44.2 % (ref 39.0–52.0)
Hemoglobin: 14.8 g/dL (ref 13.0–17.0)
Immature Granulocytes: 0 %
Lymphocytes Relative: 27 %
Lymphs Abs: 2.1 10*3/uL (ref 0.7–4.0)
MCH: 28.4 pg (ref 26.0–34.0)
MCHC: 33.5 g/dL (ref 30.0–36.0)
MCV: 84.7 fL (ref 80.0–100.0)
Monocytes Absolute: 0.8 10*3/uL (ref 0.1–1.0)
Monocytes Relative: 10 %
Neutro Abs: 4.9 10*3/uL (ref 1.7–7.7)
Neutrophils Relative %: 61 %
Platelets: 186 10*3/uL (ref 150–400)
RBC: 5.22 MIL/uL (ref 4.22–5.81)
RDW: 12.8 % (ref 11.5–15.5)
WBC: 8 10*3/uL (ref 4.0–10.5)
nRBC: 0 % (ref 0.0–0.2)

## 2022-05-29 LAB — LACTIC ACID, PLASMA
Lactic Acid, Venous: 0.9 mmol/L (ref 0.5–1.9)
Lactic Acid, Venous: 0.6 mmol/L (ref 0.5–1.9)

## 2022-05-29 LAB — TROPONIN I (HIGH SENSITIVITY)
Troponin I (High Sensitivity): <2 ng/L (ref <18)
Troponin I (High Sensitivity): <2 ng/L (ref <18)

## 2022-05-29 LAB — BRAIN NATRIURETIC PEPTIDE: B Natriuretic Peptide: 7.5 pg/mL (ref 0.0–100.0)

## 2022-05-29 LAB — POCT RAPID STREP A (OFFICE): Rapid Strep A Screen: NEGATIVE

## 2022-05-29 LAB — SARS CORONAVIRUS 2 BY RT PCR: SARS Coronavirus 2 by RT PCR: NEGATIVE

## 2022-05-29 MED ORDER — SODIUM CHLORIDE 0.9 % IV BOLUS
1000.0000 mL | Freq: Once | INTRAVENOUS | Status: AC
  Administered 2022-05-29: 1000 mL via INTRAVENOUS

## 2022-05-29 MED ORDER — MORPHINE SULFATE (PF) 4 MG/ML IV SOLN
4.0000 mg | Freq: Once | INTRAVENOUS | Status: AC
  Administered 2022-05-29: 4 mg via INTRAVENOUS
  Filled 2022-05-29: qty 1

## 2022-05-29 MED ORDER — IOHEXOL 350 MG/ML SOLN
100.0000 mL | Freq: Once | INTRAVENOUS | Status: AC | PRN
  Administered 2022-05-29: 75 mL via INTRAVENOUS

## 2022-05-29 MED ORDER — HYDROMORPHONE HCL 1 MG/ML IJ SOLN
1.0000 mg | Freq: Once | INTRAMUSCULAR | Status: AC
  Administered 2022-05-29: 1 mg via INTRAVENOUS
  Filled 2022-05-29: qty 1

## 2022-05-29 NOTE — ED Provider Notes (Signed)
[place] EMERGENCY DEPARTMENT AT [facility] Provider Note   CSN: [id number] Arrival date & time: 05/29/22  1436     History  Chief Complaint  Patient presents with   Neck Pain   Chest Pain    Michael Lam is a 27 y.o. male.  The history is provided by the patient and medical records. No language interpreter was used.  Neck Pain Pain location:  Generalized neck Quality:  Aching Radiates to: upper chest. Pain severity:  Severe Pain is:  Same all the time Onset quality:  Gradual Duration:  3 days Timing:  Constant Progression:  Worsening Chronicity:  New Relieved by:  Nothing Worsened by:  Swallowing and position Ineffective treatments:  None tried Associated symptoms: chest pain   Associated symptoms: no headaches, no numbness, no photophobia and no visual change  Fever: chills present. Risk factors: no recent head injury   Chest Pain Associated symptoms: diaphoresis, dysphagia and shortness of breath   Associated symptoms: no abdominal pain, no back pain, no cough, no dizziness, no headache, no nausea, no numbness, no palpitations and no vomiting  Fever: chills present.      Home Medications Prior to Admission medications   Medication Sig Start Date End Date Taking? Authorizing Provider  acetaminophen (TYLENOL) 650 MG CR tablet Take 650 mg by mouth every 8 (eight) hours as needed for pain.    [provider]  amphetamine-dextroamphetamine (ADDERALL XR) 20 MG 24 hr capsule Take 1 capsule (20 mg total) by mouth daily. 05/12/22     aspirin-acetaminophen-caffeine (EXCEDRIN MIGRAINE) 250-250-65 MG tablet Take 2 tablets by mouth every 6 (six) hours as needed for headache or migraine.    [provider]  buPROPion (WELLBUTRIN XL) 150 MG 24 hr tablet Take 1 tablet (150 mg total) by mouth daily. 02/04/22   Eden Emms, NP  fexofenadine (ALLEGRA) 180 MG tablet Take 180 mg by mouth daily.    [provider]  ibuprofen (ADVIL)  200 MG tablet Take 400 mg by mouth every 6 (six) hours as needed for mild pain or moderate pain.    [provider]  ketoconazole (NIZORAL) 2 % cream Apply 1 Application topically daily. For two weeks 01/08/22   Eden Emms, NP  lidocaine (XYLOCAINE) 5 % ointment Apply 1 Application topically 4 (four) times daily as needed. 02/10/22   Radford Pax, NP  naproxen (NAPROSYN) 500 MG tablet Take 1 tablet (500 mg total) by mouth 2 (two) times daily with a meal. 02/13/22   Wallis Bamberg, PA-C  ondansetron (ZOFRAN-ODT) 4 MG disintegrating tablet Take 1 tablet (4 mg total) by mouth every 8 (eight) hours as needed for nausea or vomiting. 05/15/22   Eden Emms, NP  senna-docusate (SENOKOT-S) 8.6-50 MG tablet Take 1 tablet by mouth at bedtime as needed for mild constipation or moderate constipation. 09/20/21   Long, Arlyss Repress, MD  triamcinolone cream (KENALOG) 0.1 % Apply 1 Application topically 2 (two) times daily. 05/15/22   Eden Emms, NP  zolpidem (AMBIEN) 10 MG tablet Take 1 tablet (10 mg total) by mouth at bedtime as needed for sleep. 12/18/21 02/16/22  Tomma Lightning, MD      Allergies    Methotrexate derivatives, Peanut oil, Peanut-containing drug products, Trexall [methotrexate], Robaxin [methocarbamol], Egg white (egg protein), Egg-derived products, Norco [hydrocodone-acetaminophen], and Plaquenil [hydroxychloroquine]    Review of Systems   Review of Systems  Constitutional:  Positive for chills and diaphoresis. Fever: chills present. HENT:  Positive for  trouble swallowing and voice change (improved now). Negative for congestion.   Eyes:  Negative for photophobia.  Respiratory:  Positive for shortness of breath. Negative for cough, chest tightness and wheezing.   Cardiovascular:  Positive for chest pain. Negative for palpitations and leg swelling.  Gastrointestinal:  Negative for abdominal pain, constipation, diarrhea, nausea and vomiting.  Genitourinary:  Negative for dysuria,  flank pain and frequency.  Musculoskeletal:  Positive for neck pain. Negative for back pain and neck stiffness.  Skin:  Negative for rash and wound.  Neurological:  Negative for dizziness, numbness and headaches.  Psychiatric/Behavioral:  Negative for agitation and confusion.   All other systems reviewed and are negative.   Physical Exam Updated Vital Signs BP (!) 144/79   Pulse 93   Temp 99 F (37.2 C)   Resp 14   SpO2 95%  Physical Exam Vitals and nursing note reviewed.  Constitutional:      General: He is not in acute distress.    Appearance: He is well-developed. He is not ill-appearing, toxic-appearing or diaphoretic.  HENT:     Head: Normocephalic and atraumatic.  Eyes:     Conjunctiva/sclera: Conjunctivae normal.     Pupils: Pupils are equal, round, and reactive to light.  Cardiovascular:     Rate and Rhythm: Regular rhythm. Tachycardia present.     Heart sounds: Normal heart sounds. No murmur heard. Pulmonary:     Effort: Pulmonary effort is normal. No tachypnea or respiratory distress.     Breath sounds: Normal breath sounds. No decreased breath sounds, wheezing, rhonchi or rales.  Chest:     Chest wall: No tenderness.  Abdominal:     Palpations: Abdomen is soft.     Tenderness: There is no abdominal tenderness.  Musculoskeletal:        General: No swelling.     Cervical back: Neck supple.     Right lower leg: No tenderness.     Left lower leg: No tenderness.  Skin:    General: Skin is warm and dry.     Capillary Refill: Capillary refill takes less than 2 seconds.     Findings: No erythema.  Neurological:     Mental Status: He is alert.  Psychiatric:        Mood and Affect: Mood normal.     ED Results / Procedures / Treatments   Labs (all labs ordered are listed, but only abnormal results are displayed) Labs Reviewed  COMPREHENSIVE METABOLIC PANEL - Abnormal; Notable for the following components:      Result Value   CO2 19 (*)    All other  components within normal limits  SARS CORONAVIRUS 2 BY RT PCR  CULTURE, BLOOD (ROUTINE X 2)  CULTURE, BLOOD (ROUTINE X 2)  CBC WITH DIFFERENTIAL/PLATELET  LACTIC ACID, PLASMA  BRAIN NATRIURETIC PEPTIDE  LACTIC ACID, PLASMA  TROPONIN I (HIGH SENSITIVITY)  TROPONIN I (HIGH SENSITIVITY)    EKG EKG Interpretation  Date/Time:  Thursday May 29 2022 14:50:41 EDT Ventricular Rate:  106 PR Interval:  123 QRS Duration: 97 QT Interval:  339 QTC Calculation: 451 R Axis:   68 Text Interpretation: Sinus tachycardia when compared to prior, similar appearance. No STEMI Confirmed by Theda Belfast ([id number]) on 05/29/2022 3:04:56 PM  Radiology CT Soft Tissue Neck W Contrast  Result Date: 05/29/2022 CLINICAL DATA:  Provided history: Epiglottitis or tonsillitis suspected. Pleuritic chest pain. Neck pain. Shortness of breath. Patient felt crepitance at home in neck. Fever. Tachycardia. EXAM: CT NECK WITH  CONTRAST TECHNIQUE: Multidetector CT imaging of the neck was performed using the standard protocol following the bolus administration of intravenous contrast. RADIATION DOSE REDUCTION: This exam was performed according to the departmental dose-optimization program which includes automated exposure control, adjustment of the mA and/or kV according to patient size and/or use of iterative reconstruction technique. CONTRAST:  75mL OMNIPAQUE IOHEXOL 350 MG/ML SOLN COMPARISON:  Same day chest CT 05/29/2022. CT angiogram head/neck 09/05/2021. Maxillofacial CT 03/27/2020. FINDINGS: Pharynx and larynx: Streak/beam hardening artifact arising from dental restoration partially obscures the oral cavity. No appreciable swelling or mass within the oral cavity, pharynx or larynx. Salivary glands: No inflammation, mass, or stone. Thyroid: Unremarkable. Lymph nodes: Nonspecific mildly enlarged bilateral level III lymph nodes, measuring up to 12 mm in short axis (for instance as seen on series 4, image 50) (series 5, image 18).  Vascular: The major vascular structures of the neck are patent. Limited intracranial: No evidence of an acute intracranial abnormality within the field of view. Visualized orbits: No orbital mass or acute orbital finding. Mastoids and visualized paranasal sinuses: Portions of the left frontal sinus are excluded from the field of view superiorly. Trace mucosal thickening within the right maxillary sinus. No significant mastoid effusion. Skeleton: Nonspecific reversal of the expected cervical lordosis. No acute fracture or aggressive osseous lesion. Upper chest: Moderate-volume pneumomediastinum at the imaged levels. No airspace consolidation at the imaged levels. No visible pneumothorax. Other: There is moderate-volume gas within the deep neck spaces, extending into the partially imaged upper mediastinum. IMPRESSION: 1. Moderate-volume gas within the deep neck spaces, extending into the partially imaged mediastinum. No definite source is identified within the neck. No fluid collection is identified within the neck. No appreciable inflammatory changes within the imaged aerodigestive tract. Please correlate with findings on the concurrently performed chest CT. 2. Nonspecific mildly enlarged bilateral level III lymph nodes. Electronically Signed   By: Jackey Loge D.O.   On: 05/29/2022 17:44   CT Angio Chest PE W and/or Wo Contrast  Result Date: 05/29/2022 CLINICAL DATA:  PE suspected, fever, tachycardia EXAM: CT ANGIOGRAPHY CHEST WITH CONTRAST TECHNIQUE: Multidetector CT imaging of the chest was performed using the standard protocol during bolus administration of intravenous contrast. Multiplanar CT image reconstructions and MIPs were obtained to evaluate the vascular anatomy. RADIATION DOSE REDUCTION: This exam was performed according to the departmental dose-optimization program which includes automated exposure control, adjustment of the mA and/or kV according to patient size and/or use of iterative  reconstruction technique. CONTRAST:  75mL OMNIPAQUE IOHEXOL 350 MG/ML SOLN COMPARISON:  None Available. FINDINGS: Cardiovascular: Satisfactory opacification of the pulmonary arteries to the segmental level. No evidence of pulmonary embolism. Normal heart size. No pericardial effusion. Mediastinum/Nodes: Pneumomediastinum and emphysema within the included lower neck (series 5, image 97). Solitary enlarged right axillary lymph node measuring 2.2 x 1.7 cm (series 5, image 61). No other enlarged mediastinal, hilar, or axillary lymph nodes. Thymic remnant in the anterior mediastinum. Thyroid gland, trachea, and esophagus demonstrate no significant findings. Lungs/Pleura: Lungs are clear. No pleural effusion or pneumothorax. Upper Abdomen: No acute abnormality. Musculoskeletal: No chest wall abnormality. No acute osseous findings. Review of the MIP images confirms the above findings. IMPRESSION: 1. Negative examination for pulmonary embolism. 2. Pneumomediastinum and emphysema within the included lower neck. No obvious etiology. 3. No pneumothorax. 4. Solitary enlarged right axillary lymph node measuring 2.2 x 1.7 cm, of uncertain significance. No other enlarged mediastinal, hilar, or axillary lymph nodes. Electronically Signed   By: Jearld Lesch  M.D.   On: 05/29/2022 17:35    Procedures Procedures    Medications Ordered in ED Medications  sodium chloride 0.9 % bolus 1,000 mL (1,000 mLs Intravenous New Bag/Given 05/29/22 1608)  morphine (PF) 4 MG/ML injection 4 mg (4 mg Intravenous Given 05/29/22 1603)  iohexol (OMNIPAQUE) 350 MG/ML injection 100 mL (75 mLs Intravenous Contrast Given 05/29/22 1658)    ED Course/ Medical Decision Making/ A&P                             Medical Decision Making Amount and/or Complexity of Data Reviewed Labs: ordered. Radiology: ordered.  Risk Prescription drug management. Decision regarding hospitalization.    Michael Lam is a 27 y.o. male with a past  medical history significant for asthma, anxiety, sleep apnea, and room to arthritis who presents with anterior neck pain and chest pain and shortness of breath.  According to patient, for the last few days he has had worsening discomfort in his lower anterior neck and difficulty with swallowing.  He reports he is unable to eat or drink in the last 2 days due to the pain.  He reports he does not feel like his throat is sore at top but is down low.  He reports she is having discomfort in his upper chest and is difficult to take a deep breath.  He is very pleuritic.  His shortness of breath.  He cannot lie flat.  He denies any choking episodes that began his symptoms nor did he eat anything that was pointed or sharp.  He did have some beer several days ago from a bar but does not suspect dirty tap lines.  No one else got sick and it did not taste funny.  He reports has had some chills but has not had documented fever yet although his temperature was 99 on arrival orally.  He denies significant cough and denies any posterior neck pain.  Denies any pain in his arms or legs and denies any lower abdominal pain.  Denies nausea, vomiting, constipation, diarrhea, or urinary changes.  Denies rashes.  He reports that at 1 point yesterday he was feeling his neck and felt some crepitance.  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  Chart review shows that he had a recent syncopal episode several weeks ago.  On my first examination, heart rate is in the 130s and he is tachypneic.  He is not hypoxic but he is warm to the touch with a temp of 99.  On other exam, lungs were clear.  Chest was nontender.  Anterior neck was tender but I cannot appreciate crepitance.  No skin changes or rash seen.  Oropharyngeal exam did not show evidence of PTA or RPA initially as the uvula is midline.  No redness posteriorly.  No congestion seen.  No trismus or palate elevation  initially on exam.  Exam otherwise unremarkable with good pulses.  Posterior neck was nontender and he is moving his neck around without difficulty.  Doubt meningitis at this time.  EKG showed sinus tachycardia without significant arrhythmia.  No STEMI.  Clinically I am somewhat concerned need to rule out concerning etiologies of this.  Will get CT PE study and also get CT with contrast of his neck to look for evidence of mediastinitis, perforation, deep neck infection or abscess.  Will get screening labs and give  him some pain medicine given the severe pain.  Will give some fluids as he is not been eating or drinking as much for the last 2 days and his tachycardia.  Anticipate reassessment after workup to determine disposition.  CT scan was reviewed by me and is concerning for pneumomediastinum and air going to his neck.  I called radiology to confirm the mediastinum.  They do not know the exact etiology but recommended talking to CT surgery first.  I spoke to CT surgery with Dr. Cliffton Asters who recommends admission to the hospital and they will see in consultation.  They felt he needed an esophagram which was ordered but cannot be done at this facility.  He will need during his admission.  His labs returned overall reassuring although his vital signs still showed some tachycardia and tachypnea.  His temperature was 99 so he was technically not septic at this time.  Dr. Cliffton Asters with CT surgery did not feel that antibiotics would be beneficial initially given his reassuring labs.  Will call medicine for admission for further monitoring, symptom management, rehydration, and management of pneumomediastinum of unclear etiology at this time.         Final Clinical Impression(s) / ED Diagnoses Final diagnoses:  Pneumomediastinum (HCC)   Clinical Impression: 1. Pneumomediastinum (HCC)     Disposition: Admit  This note was prepared with assistance of Dragon voice recognition software.  Occasional wrong-word or sound-a-like substitutions may have occurred due to the inherent limitations of voice recognition software.      Yuritza Paulhus, Canary Brim, MD 05/30/22 0000

## 2022-05-29 NOTE — ED Notes (Signed)
Report received from [place], [place]. Patient resting quietly in stretcher, respirations even, unlabored, no acute distress noted. Denies needs at this time. Parents at bedside.

## 2022-05-29 NOTE — ED Notes (Signed)
Patient transported to CT 

## 2022-05-29 NOTE — ED Provider Notes (Signed)
[facility]    CSN: [id number] Arrival date & time: 05/29/22  1332      History   Chief Complaint Chief Complaint  Patient presents with   neck swelling    HPI Michael Lam is a 27 y.o. male presents for evaluation of esophageal pain and difficulty swallowing.  Patient reports 2 days of worsening difficulty swallowing with pain that radiates from his sternal notch down to his epigastric area.  He states he is unable to lay flat and breathe.  He has been unable to eat and can only do small sips of water over the past 24 hours.  He also feels like his throat is swollen and feels tender to touch.  Denies any abdominal pain or nausea/vomiting.  States he has been unable to belch as well.  Does not feel he has been passing gas over the past 24 hours but again denies abdominal pain.  No chest pain or shortness of breath.  Denies any history of enlarged thyroid or esophageal narrowing/strictures.  No history of GERD.  He does have a low-grade fever in clinic but denies any URI symptoms that could indicate a fever.  Also denies sore throat.  No OTC medications have been used for symptoms.  No other concerns at this time.  HPI  Past Medical History:  Diagnosis Date   Allergy    Anxiety    Asthma    Pilonidal cyst    Recurrent upper respiratory infection (URI)     Patient Active Problem List   Diagnosis Date Noted   Concentration deficit 03/27/2022   Psoriasis 03/27/2022   Palpitations 01/08/2022   Preventative health care 11/26/2021   Class 1 obesity due to excess calories without serious comorbidity with body mass index (BMI) of 32.0 to 32.9 in adult 11/26/2021   Non-restorative sleep 11/26/2021   Rheumatoid arthritis of multiple sites with negative rheumatoid factor (HCC) 11/23/2018   Family history of rheumatoid arthritis 11/23/2018   Chronic nonintractable headache 02/02/2018   Nausea 02/02/2018   Chronic allergic rhinitis 11/24/2017   Shift work sleep  disorder 07/06/2017   Obstructive sleep apnea 07/06/2017   History of hypoglycemia 07/03/2017   Ganglion cyst of foot 06/17/2017   History of asthma 05/19/2017   Chronic migraine without aura without status migrainosus, not intractable 12/31/2016   Other complicated headache syndrome 12/09/2016   Rash 05/25/2015   Anxiety 04/13/2015    Past Surgical History:  Procedure Laterality Date   ADENOIDECTOMY     SKIN BIOPSY  10/07/2018   right 5th digit.    TONSILLECTOMY         Home Medications    Prior to Admission medications   Medication Sig Start Date End Date Taking? Authorizing Provider  acetaminophen (TYLENOL) 650 MG CR tablet Take 650 mg by mouth every 8 (eight) hours as needed for pain.    [provider]  amphetamine-dextroamphetamine (ADDERALL XR) 20 MG 24 hr capsule Take 1 capsule (20 mg total) by mouth daily. 05/12/22     aspirin-acetaminophen-caffeine (EXCEDRIN MIGRAINE) 250-250-65 MG tablet Take 2 tablets by mouth every 6 (six) hours as needed for headache or migraine.    [provider]  buPROPion (WELLBUTRIN XL) 150 MG 24 hr tablet Take 1 tablet (150 mg total) by mouth daily. 02/04/22   Eden Emms, NP  fexofenadine (ALLEGRA) 180 MG tablet Take 180 mg by mouth daily.    [provider]  ibuprofen (ADVIL) 200 MG tablet Take 400 mg by mouth  every 6 (six) hours as needed for mild pain or moderate pain.    [provider]  ketoconazole (NIZORAL) 2 % cream Apply 1 Application topically daily. For two weeks 01/08/22   Eden Emms, NP  lidocaine (XYLOCAINE) 5 % ointment Apply 1 Application topically 4 (four) times daily as needed. 02/10/22   Radford Pax, NP  naproxen (NAPROSYN) 500 MG tablet Take 1 tablet (500 mg total) by mouth 2 (two) times daily with a meal. 02/13/22   Wallis Bamberg, PA-C  ondansetron (ZOFRAN-ODT) 4 MG disintegrating tablet Take 1 tablet (4 mg total) by mouth every 8 (eight) hours as needed for nausea or vomiting. 05/15/22    Eden Emms, NP  senna-docusate (SENOKOT-S) 8.6-50 MG tablet Take 1 tablet by mouth at bedtime as needed for mild constipation or moderate constipation. 09/20/21   Long, Arlyss Repress, MD  triamcinolone cream (KENALOG) 0.1 % Apply 1 Application topically 2 (two) times daily. 05/15/22   Eden Emms, NP  zolpidem (AMBIEN) 10 MG tablet Take 1 tablet (10 mg total) by mouth at bedtime as needed for sleep. 12/18/21 02/16/22  Tomma Lightning, MD    Family History Family History  Problem Relation Age of Onset   Thyroid disease Mother    Chiari malformation Mother    Hyperlipidemia Father    Hypertension Father    Diabetes Father    Rheum arthritis Father    Other Father        alpha thylosemia   Fibromyalgia Father    Lupus Sister    Thyroid disease Brother    Diabetes Maternal Grandmother    Migraines Maternal Grandmother    Rheum arthritis Maternal Grandmother    Stroke Maternal Grandfather    Hypertension Maternal Grandfather    Hypertension Paternal Grandmother    Asthma Paternal Grandmother    Migraines Paternal Grandmother    Stroke Paternal Grandfather     Social History Social History   Tobacco Use   Smoking status: Never    Passive exposure: Never   Smokeless tobacco: Never   Tobacco comments:    Pt currently vapes.  Has nicotine.  Vaping Use   Vaping Use: Every day   Substances: Nicotine, Flavoring  Substance Use Topics   Alcohol use: Yes    Comment: occ   Drug use: No     Allergies   Methotrexate derivatives, Peanut oil, Peanut-containing drug products, Trexall [methotrexate], Robaxin [methocarbamol], Egg white (egg protein), Egg-derived products, Norco [hydrocodone-acetaminophen], and Plaquenil [hydroxychloroquine]   Review of Systems Review of Systems  Gastrointestinal:        Difficulty swallowing with esophageal pain     Physical Exam Triage Vital Signs ED Triage Vitals  Enc Vitals Group     BP 05/29/22 1351 109/75     Pulse Rate 05/29/22  1351 (!) 117     Resp 05/29/22 1351 17     Temp 05/29/22 1351 99.7 F (37.6 C)     Temp Source 05/29/22 1351 Oral     SpO2 05/29/22 1351 96 %     Weight --      Height --      Head Circumference --      Peak Flow --      Pain Score 05/29/22 1350 5     Pain Loc --      Pain Edu? --      Excl. in GC? --    No data found.  Updated Vital Signs BP 109/75 (BP  Location: Right Arm)   Pulse (!) 117   Temp 99.7 F (37.6 C) (Oral)   Resp 17   SpO2 96%   Visual Acuity Right Eye Distance:   Left Eye Distance:   Bilateral Distance:    Right Eye Near:   Left Eye Near:    Bilateral Near:     Physical Exam Vitals and nursing note reviewed.  Constitutional:      General: He is not in acute distress.    Appearance: Normal appearance. He is not ill-appearing, toxic-appearing or diaphoretic.  HENT:     Head: Normocephalic and atraumatic.     Mouth/Throat:     Mouth: Mucous membranes are moist.     Pharynx: Oropharynx is clear. Uvula midline. No pharyngeal swelling or uvula swelling.  Eyes:     Conjunctiva/sclera: Conjunctivae normal.     Pupils: Pupils are equal, round, and reactive to light.  Neck:     Thyroid: No thyroid mass, thyromegaly or thyroid tenderness.  Cardiovascular:     Rate and Rhythm: Tachycardia present.     Comments: Mildly tacky at 117 Pulmonary:     Effort: Pulmonary effort is normal.  Skin:    General: Skin is warm and dry.  Neurological:     General: No focal deficit present.     Mental Status: He is alert and oriented to person, place, and time.  Psychiatric:        Mood and Affect: Mood normal.        Behavior: Behavior normal.      UC Treatments / Results  Labs (all labs ordered are listed, but only abnormal results are displayed) Labs Reviewed  POCT RAPID STREP A (OFFICE)    EKG   Radiology No results found.  Procedures Procedures (including critical care time)  Medications Ordered in UC Medications - No data to  display  Initial Impression / Assessment and Plan / UC Course  I have reviewed the triage vital signs and the nursing notes.  Pertinent labs & imaging results that were available during my care of the patient were reviewed by me and considered in my medical decision making (see chart for details).     Reviewed exam and symptoms with patient.  Rapid strep negative. Discussed limitations and abilities of urgent care.  Given patient symptoms and concern I advised to go to the ER for further evaluation and treatment.  He is in agreement with plan will go POV to the emergency room.  He was instructed to pull over and call 911 for any worsening symptoms that occur in transit and he verbalized understanding Final Clinical Impressions(s) / UC Diagnoses   Final diagnoses:  Esophageal pain  Dysphagia, unspecified type     Discharge Instructions      Please go to the ER for further evaluation and workup of your symptoms   ED Prescriptions   None    PDMP not reviewed this encounter.   Radford Pax, NP 05/29/22 [phone]

## 2022-05-29 NOTE — Progress Notes (Signed)
Plan of Care Note for accepted transfer   Patient: Michael Lam MRN: [id number]   DOA: 05/29/2022  Facility requesting transfer: [facility] Requesting Provider: Lynden Oxford, MD (Emergency Medicine) Reason for transfer: Pneumomediastinum Facility course:  Patient is a 27 year old male with a history of asthma who presented to the ED for evaluation of pain involving his anterior neck and chest associated with difficulty swallowing.  Vitals showed mild tachycardia.  Labs are stable.  CT imaging shows moderate volume of gas within the deep neck spaces extending into the mediastinum.  No definite source identified.  No fluid collection within the neck.  No evidence of PE.  No pneumothorax.  EDP discussed with on-call cardiothoracic surgery, Dr. Cliffton Asters.  They recommended medical admission, obtain esophagram (cannot be done at [facility]), and they will see in consultation.  Plan of care: The patient is accepted for admission to Progressive unit, at [facility]..   Author: Darreld Mclean, MD 05/29/2022  Check [email or web address] for on-call coverage.  Nursing staff, Please call [facility] Admits & Consults [facility]-Wide number on Amion as soon as patient's arrival, so appropriate admitting provider can evaluate the pt.

## 2022-05-29 NOTE — ED Notes (Signed)
Patient reports increasing pain, MD notified.

## 2022-05-29 NOTE — ED Notes (Signed)
Pt states last night he was drenched in sweat in the middle of the night.

## 2022-05-29 NOTE — Discharge Instructions (Addendum)
Please go to the ER for further evaluation and workup of your symptoms

## 2022-05-29 NOTE — ED Triage Notes (Signed)
Pt presents with c/o swollen neck since Tuesday. States it had been hurting and states it has been hard to swallow.   Pt states when he presses on his neck he feels it tender.

## 2022-05-29 NOTE — ED Triage Notes (Signed)
Pt c/o swollen neck, "squeezing pain from adams apple down to base of sternum." Poor PO associated. States yesterday it got worse, "yesterday I took a bite of a sandwich & couldn't get it down." Associated SHOB, low-grade fever, denies N/V, states "I feel like I can't get anything down." UC swabbed him for strep, was advised to come to ED for further eval.   Pt states that neck feels "crackly" around airway

## 2022-05-30 ENCOUNTER — Other Ambulatory Visit (HOSPITAL_COMMUNITY): Payer: Self-pay

## 2022-05-30 ENCOUNTER — Other Ambulatory Visit (HOSPITAL_BASED_OUTPATIENT_CLINIC_OR_DEPARTMENT_OTHER): Payer: Self-pay

## 2022-05-30 ENCOUNTER — Inpatient Hospital Stay (HOSPITAL_COMMUNITY): Payer: Commercial Managed Care - PPO

## 2022-05-30 DIAGNOSIS — Z8249 Family history of ischemic heart disease and other diseases of the circulatory system: Secondary | ICD-10-CM | POA: Diagnosis not present

## 2022-05-30 DIAGNOSIS — E872 Acidosis, unspecified: Secondary | ICD-10-CM | POA: Diagnosis present

## 2022-05-30 DIAGNOSIS — Z8261 Family history of arthritis: Secondary | ICD-10-CM | POA: Diagnosis not present

## 2022-05-30 DIAGNOSIS — R1310 Dysphagia, unspecified: Secondary | ICD-10-CM | POA: Diagnosis present

## 2022-05-30 DIAGNOSIS — F419 Anxiety disorder, unspecified: Secondary | ICD-10-CM | POA: Diagnosis present

## 2022-05-30 DIAGNOSIS — J982 Interstitial emphysema: Secondary | ICD-10-CM | POA: Diagnosis not present

## 2022-05-30 DIAGNOSIS — Z8349 Family history of other endocrine, nutritional and metabolic diseases: Secondary | ICD-10-CM | POA: Diagnosis not present

## 2022-05-30 DIAGNOSIS — F909 Attention-deficit hyperactivity disorder, unspecified type: Secondary | ICD-10-CM | POA: Diagnosis present

## 2022-05-30 DIAGNOSIS — Z833 Family history of diabetes mellitus: Secondary | ICD-10-CM | POA: Diagnosis not present

## 2022-05-30 DIAGNOSIS — Z823 Family history of stroke: Secondary | ICD-10-CM | POA: Diagnosis not present

## 2022-05-30 DIAGNOSIS — Z83438 Family history of other disorder of lipoprotein metabolism and other lipidemia: Secondary | ICD-10-CM | POA: Diagnosis not present

## 2022-05-30 DIAGNOSIS — J45909 Unspecified asthma, uncomplicated: Secondary | ICD-10-CM

## 2022-05-30 DIAGNOSIS — Z1152 Encounter for screening for COVID-19: Secondary | ICD-10-CM | POA: Diagnosis not present

## 2022-05-30 DIAGNOSIS — Z885 Allergy status to narcotic agent status: Secondary | ICD-10-CM | POA: Diagnosis not present

## 2022-05-30 DIAGNOSIS — Z79899 Other long term (current) drug therapy: Secondary | ICD-10-CM | POA: Diagnosis not present

## 2022-05-30 DIAGNOSIS — Z825 Family history of asthma and other chronic lower respiratory diseases: Secondary | ICD-10-CM | POA: Diagnosis not present

## 2022-05-30 DIAGNOSIS — Z832 Family history of diseases of the blood and blood-forming organs and certain disorders involving the immune mechanism: Secondary | ICD-10-CM | POA: Diagnosis not present

## 2022-05-30 DIAGNOSIS — Z888 Allergy status to other drugs, medicaments and biological substances status: Secondary | ICD-10-CM | POA: Diagnosis not present

## 2022-05-30 DIAGNOSIS — Z91010 Allergy to peanuts: Secondary | ICD-10-CM | POA: Diagnosis not present

## 2022-05-30 DIAGNOSIS — Z91012 Allergy to eggs: Secondary | ICD-10-CM | POA: Diagnosis not present

## 2022-05-30 LAB — MRSA NEXT GEN BY PCR, NASAL: MRSA by PCR Next Gen: NOT DETECTED

## 2022-05-30 LAB — BASIC METABOLIC PANEL
Anion gap: 9 (ref 5–15)
BUN: 17 mg/dL (ref 6–20)
CO2: 22 mmol/L (ref 22–32)
Calcium: 8.9 mg/dL (ref 8.9–10.3)
Chloride: 105 mmol/L (ref 98–111)
Creatinine, Ser: 0.97 mg/dL (ref 0.61–1.24)
GFR, Estimated: >60 mL/min (ref >60)
Glucose, Bld: 71 mg/dL (ref 70–99)
Potassium: 4.2 mmol/L (ref 3.5–5.1)
Sodium: 136 mmol/L (ref 135–145)

## 2022-05-30 LAB — CULTURE, BLOOD (ROUTINE X 2): Culture: NO GROWTH

## 2022-05-30 MED ORDER — TRAMADOL HCL 50 MG PO TABS
50.0000 mg | ORAL_TABLET | Freq: Three times a day (TID) | ORAL | 0 refills | Status: DC | PRN
  Filled 2022-05-30: qty 15, 5d supply, fill #0

## 2022-05-30 MED ORDER — TRAMADOL HCL 50 MG PO TABS
50.0000 mg | ORAL_TABLET | Freq: Three times a day (TID) | ORAL | 0 refills | Status: AC | PRN
  Filled 2022-05-30: qty 15, 7d supply, fill #0

## 2022-05-30 MED ORDER — SODIUM CHLORIDE 0.9 % IV SOLN: INTRAVENOUS | Status: DC

## 2022-05-30 MED ORDER — TRAMADOL HCL 50 MG PO TABS: 50.0000 mg | ORAL_TABLET | Freq: Four times a day (QID) | ORAL | Status: DC | PRN

## 2022-05-30 MED ORDER — ONDANSETRON HCL 4 MG/2ML IJ SOLN
4.0000 mg | Freq: Four times a day (QID) | INTRAMUSCULAR | Status: DC | PRN
  Administered 2022-05-30: 4 mg via INTRAVENOUS
  Filled 2022-05-30: qty 2

## 2022-05-30 MED ORDER — MORPHINE SULFATE (PF) 2 MG/ML IV SOLN: 1.0000 mg | INTRAVENOUS | Status: DC | PRN

## 2022-05-30 MED ORDER — HYDROMORPHONE HCL 1 MG/ML IJ SOLN
1.0000 mg | INTRAMUSCULAR | Status: DC | PRN
  Administered 2022-05-30: 1 mg via INTRAVENOUS
  Filled 2022-05-30: qty 1

## 2022-05-30 MED ORDER — NALOXONE HCL 0.4 MG/ML IJ SOLN: 0.4000 mg | INTRAMUSCULAR | Status: DC | PRN

## 2022-05-30 MED ORDER — IOHEXOL 300 MG/ML  SOLN
100.0000 mL | Freq: Once | INTRAMUSCULAR | Status: AC | PRN
  Administered 2022-05-30: 150 mL via ORAL

## 2022-05-30 NOTE — H&P (Signed)
History and Physical    Michael Lam HQI:[id number] DOB: 1995/02/27 DOA: 05/29/2022  PCP: Eden Emms, NP  Patient coming from: [facility] ED  Chief Complaint: Neck pain  HPI: Michael Lam is a 27 y.o. male with medical history significant of asthma, anxiety, ADHD presented to ED for evaluation of pain involving his anterior neck and chest associated with difficulty swallowing.  Tachycardic on arrival to the ED but remainder of vital signs stable.  CBC unremarkable.  CMP showing mild normal anion gap metabolic acidosis and no other significant abnormality.  Lactic acid normal x 2.  Troponin negative x 2 and BNP normal.  Blood cultures drawn.  CTA chest: "IMPRESSION: 1. Negative examination for pulmonary embolism. 2. Pneumomediastinum and emphysema within the included lower neck. No obvious etiology. 3. No pneumothorax. 4. Solitary enlarged right axillary lymph node measuring 2.2 x 1.7 cm, of uncertain significance. No other enlarged mediastinal, hilar, or axillary lymph nodes."  CT soft tissue neck with contrast: "IMPRESSION: 1. Moderate-volume gas within the deep neck spaces, extending into the partially imaged mediastinum. No definite source is identified within the neck. No fluid collection is identified within the neck. No appreciable inflammatory changes within the imaged aerodigestive tract. Please correlate with findings on the concurrently performed chest CT. 2. Nonspecific mildly enlarged bilateral level III lymph nodes."  ED physician discussed the case with Dr. Cliffton Asters with cardiothoracic surgery who recommended medical admission and obtaining esophagram.  Recommended holding off antibiotics and cardiothoracic surgery will consult in the morning.  Patient received Dilaudid, morphine, and 1 L normal saline bolus in the ED.  Patient states for the past 4 days he is having pain in his anterior neck and chest, difficulty swallowing, and choking  sensation whenever he tries to eat.  He is hearing a crackling sound when he touches his neck.  He recalls going to the gym 4 days ago and lifting 30 or 40 pounds of weight/doing chest press which is not new for him.  Denies any obvious trauma to his chest.  He has felt nauseous but denies any episodes of vomiting and denies abdominal pain.  He is not coughing.  No other complaints.  Review of Systems:  Review of Systems  All other systems reviewed and are negative.   Past Medical History:  Diagnosis Date   Allergy    Anxiety    Asthma    Pilonidal cyst    Recurrent upper respiratory infection (URI)     Past Surgical History:  Procedure Laterality Date   ADENOIDECTOMY     SKIN BIOPSY  10/07/2018   right 5th digit.    TONSILLECTOMY       reports that he has never smoked. He has never been exposed to tobacco smoke. He has never used smokeless tobacco. He reports current alcohol use. He reports that he does not use drugs.  Allergies  Allergen Reactions   Methotrexate Derivatives Hives, Itching and Other (See Comments)    Itchy rash on upper body and face after 1 dose   Peanut Oil Rash   Robaxin [Methocarbamol] Other (See Comments)    Causes headache, fever, dizziness    Egg White (Egg Protein) Other (See Comments)    Abdominal Pain   Norco [Hydrocodone-Acetaminophen] Itching, Rash and Other (See Comments)    Rash on thigh and upper body with itching   Plaquenil [Hydroxychloroquine] Itching, Rash and Other (See Comments)    Entire body itches    Family History  Problem Relation Age of Onset  Thyroid disease Mother    Chiari malformation Mother    Hyperlipidemia Father    Hypertension Father    Diabetes Father    Rheum arthritis Father    Other Father        alpha thylosemia   Fibromyalgia Father    Lupus Sister    Thyroid disease Brother    Diabetes Maternal Grandmother    Migraines Maternal Grandmother    Rheum arthritis Maternal Grandmother    Stroke Maternal  Grandfather    Hypertension Maternal Grandfather    Hypertension Paternal Grandmother    Asthma Paternal Grandmother    Migraines Paternal Grandmother    Stroke Paternal Grandfather     Prior to Admission medications   Medication Sig Start Date End Date Taking? Authorizing Provider  acetaminophen (TYLENOL) 650 MG CR tablet Take 650 mg by mouth every 8 (eight) hours as needed for pain.    [provider]  amphetamine-dextroamphetamine (ADDERALL XR) 20 MG 24 hr capsule Take 1 capsule (20 mg total) by mouth daily. 05/12/22     aspirin-acetaminophen-caffeine (EXCEDRIN MIGRAINE) 250-250-65 MG tablet Take 2 tablets by mouth every 6 (six) hours as needed for headache or migraine.    [provider]  buPROPion (WELLBUTRIN XL) 150 MG 24 hr tablet Take 1 tablet (150 mg total) by mouth daily. 02/04/22   Eden Emms, NP  fexofenadine (ALLEGRA) 180 MG tablet Take 180 mg by mouth daily.    [provider]  ibuprofen (ADVIL) 200 MG tablet Take 400 mg by mouth every 6 (six) hours as needed for mild pain or moderate pain.    [provider]  ketoconazole (NIZORAL) 2 % cream Apply 1 Application topically daily. For two weeks 01/08/22   Eden Emms, NP  lidocaine (XYLOCAINE) 5 % ointment Apply 1 Application topically 4 (four) times daily as needed. 02/10/22   Radford Pax, NP  naproxen (NAPROSYN) 500 MG tablet Take 1 tablet (500 mg total) by mouth 2 (two) times daily with a meal. 02/13/22   Wallis Bamberg, PA-C  ondansetron (ZOFRAN-ODT) 4 MG disintegrating tablet Take 1 tablet (4 mg total) by mouth every 8 (eight) hours as needed for nausea or vomiting. 05/15/22   Eden Emms, NP  senna-docusate (SENOKOT-S) 8.6-50 MG tablet Take 1 tablet by mouth at bedtime as needed for mild constipation or moderate constipation. 09/20/21   Long, Arlyss Repress, MD  triamcinolone cream (KENALOG) 0.1 % Apply 1 Application topically 2 (two) times daily. 05/15/22   Eden Emms, NP  zolpidem (AMBIEN)  10 MG tablet Take 1 tablet (10 mg total) by mouth at bedtime as needed for sleep. 12/18/21 02/16/22  Tomma Lightning, MD    Physical Exam: Vitals:   05/30/22 0104 05/30/22 0200 05/30/22 0400 05/30/22 0454  BP: 121/73   102/65  Pulse: 72 62 60 74  Resp: 19 18 14 13   Temp: 97.8 F (36.6 C)   98 F (36.7 C)  TempSrc: Oral   Oral  SpO2: 98% 95% 97% 96%  Weight: 97.1 kg     Height: 5\' 11"  (1.803 m)       Physical Exam Vitals reviewed.  Constitutional:      General: He is not in acute distress. HENT:     Head: Normocephalic and atraumatic.  Eyes:     Extraocular Movements: Extraocular movements intact.  Cardiovascular:     Rate and Rhythm: Normal rate and regular rhythm.     Pulses: Normal pulses.  Pulmonary:  Effort: Pulmonary effort is normal. No respiratory distress.     Breath sounds: Normal breath sounds. No wheezing or rales.  Abdominal:     General: Bowel sounds are normal. There is no distension.     Palpations: Abdomen is soft.     Tenderness: There is no abdominal tenderness.  Musculoskeletal:     Cervical back: Normal range of motion.     Right lower leg: No edema.     Left lower leg: No edema.  Skin:    General: Skin is warm and dry.  Neurological:     General: No focal deficit present.     Mental Status: He is alert and oriented to person, place, and time.     Labs on Admission: I have personally reviewed following labs and imaging studies  CBC: Recent Labs  Lab 05/29/22 1523  WBC 8.0  NEUTROABS 4.9  HGB 14.8  HCT 44.2  MCV 84.7  PLT 186   Basic Metabolic Panel: Recent Labs  Lab 05/29/22 1523  NA 137  K 4.2  CL 105  CO2 19*  GLUCOSE 87  BUN 14  CREATININE 1.01  CALCIUM 9.8   GFR: Estimated Creatinine Clearance: 130.5 mL/min (by C-G formula based on SCr of 1.01 mg/dL). Liver Function Tests: Recent Labs  Lab 05/29/22 1523  AST 24  ALT 23  ALKPHOS 40  BILITOT 0.7  PROT 7.7  ALBUMIN 4.7   No results for input(s): "LIPASE",  "AMYLASE" in the last 168 hours. No results for input(s): "AMMONIA" in the last 168 hours. Coagulation Profile: No results for input(s): "INR", "PROTIME" in the last 168 hours. Cardiac Enzymes: No results for input(s): "CKTOTAL", "CKMB", "CKMBINDEX", "TROPONINI" in the last 168 hours. BNP (last 3 results) No results for input(s): "PROBNP" in the last 8760 hours. HbA1C: No results for input(s): "HGBA1C" in the last 72 hours. CBG: No results for input(s): "GLUCAP" in the last 168 hours. Lipid Profile: No results for input(s): "CHOL", "HDL", "LDLCALC", "TRIG", "CHOLHDL", "LDLDIRECT" in the last 72 hours. Thyroid Function Tests: No results for input(s): "TSH", "T4TOTAL", "FREET4", "T3FREE", "THYROIDAB" in the last 72 hours. Anemia Panel: No results for input(s): "VITAMINB12", "FOLATE", "FERRITIN", "TIBC", "IRON", "RETICCTPCT" in the last 72 hours. Urine analysis:    Component Value Date/Time   COLORURINE STRAW (A) 05/02/2022 1835   APPEARANCEUR CLEAR 05/02/2022 1835   LABSPEC 1.005 05/02/2022 1835   PHURINE 6.0 05/02/2022 1835   GLUCOSEU NEGATIVE 05/02/2022 1835   HGBUR NEGATIVE 05/02/2022 1835   BILIRUBINUR NEGATIVE 05/02/2022 1835   BILIRUBINUR negative 04/08/2022 0925   KETONESUR 20 (A) 05/02/2022 1835   PROTEINUR NEGATIVE 05/02/2022 1835   UROBILINOGEN 0.2 04/08/2022 0925   NITRITE NEGATIVE 05/02/2022 1835   LEUKOCYTESUR NEGATIVE 05/02/2022 1835    Radiological Exams on Admission: CT Soft Tissue Neck W Contrast  Addendum Date: 05/29/2022   ADDENDUM REPORT: 05/29/2022 18:22 ADDENDUM: These results were called by telephone at the time of interpretation on 05/29/2022 at 6:15 pm to provider [facility] , who verbally acknowledged these results. Electronically Signed   By: Jackey Loge D.O.   On: 05/29/2022 18:22   Result Date: 05/29/2022 CLINICAL DATA:  Provided history: Epiglottitis or tonsillitis suspected. Pleuritic chest pain. Neck pain. Shortness of breath. Patient felt  crepitance at home in neck. Fever. Tachycardia. EXAM: CT NECK WITH CONTRAST TECHNIQUE: Multidetector CT imaging of the neck was performed using the standard protocol following the bolus administration of intravenous contrast. RADIATION DOSE REDUCTION: This exam was performed according to  the departmental dose-optimization program which includes automated exposure control, adjustment of the mA and/or kV according to patient size and/or use of iterative reconstruction technique. CONTRAST:  75mL OMNIPAQUE IOHEXOL 350 MG/ML SOLN COMPARISON:  Same day chest CT 05/29/2022. CT angiogram head/neck 09/05/2021. Maxillofacial CT 03/27/2020. FINDINGS: Pharynx and larynx: Streak/beam hardening artifact arising from dental restoration partially obscures the oral cavity. No appreciable swelling or mass within the oral cavity, pharynx or larynx. Salivary glands: No inflammation, mass, or stone. Thyroid: Unremarkable. Lymph nodes: Nonspecific mildly enlarged bilateral level III lymph nodes, measuring up to 12 mm in short axis (for instance as seen on series 4, image 50) (series 5, image 18). Vascular: The major vascular structures of the neck are patent. Limited intracranial: No evidence of an acute intracranial abnormality within the field of view. Visualized orbits: No orbital mass or acute orbital finding. Mastoids and visualized paranasal sinuses: Portions of the left frontal sinus are excluded from the field of view superiorly. Trace mucosal thickening within the right maxillary sinus. No significant mastoid effusion. Skeleton: Nonspecific reversal of the expected cervical lordosis. No acute fracture or aggressive osseous lesion. Upper chest: Moderate-volume pneumomediastinum at the imaged levels. No airspace consolidation at the imaged levels. No visible pneumothorax. Other: There is moderate-volume gas within the deep neck spaces, extending into the partially imaged upper mediastinum. IMPRESSION: 1. Moderate-volume gas within  the deep neck spaces, extending into the partially imaged mediastinum. No definite source is identified within the neck. No fluid collection is identified within the neck. No appreciable inflammatory changes within the imaged aerodigestive tract. Please correlate with findings on the concurrently performed chest CT. 2. Nonspecific mildly enlarged bilateral level III lymph nodes. Electronically Signed: By: Jackey Loge D.O. On: 05/29/2022 17:44   CT Angio Chest PE W and/or Wo Contrast  Result Date: 05/29/2022 CLINICAL DATA:  PE suspected, fever, tachycardia EXAM: CT ANGIOGRAPHY CHEST WITH CONTRAST TECHNIQUE: Multidetector CT imaging of the chest was performed using the standard protocol during bolus administration of intravenous contrast. Multiplanar CT image reconstructions and MIPs were obtained to evaluate the vascular anatomy. RADIATION DOSE REDUCTION: This exam was performed according to the departmental dose-optimization program which includes automated exposure control, adjustment of the mA and/or kV according to patient size and/or use of iterative reconstruction technique. CONTRAST:  75mL OMNIPAQUE IOHEXOL 350 MG/ML SOLN COMPARISON:  None Available. FINDINGS: Cardiovascular: Satisfactory opacification of the pulmonary arteries to the segmental level. No evidence of pulmonary embolism. Normal heart size. No pericardial effusion. Mediastinum/Nodes: Pneumomediastinum and emphysema within the included lower neck (series 5, image 97). Solitary enlarged right axillary lymph node measuring 2.2 x 1.7 cm (series 5, image 61). No other enlarged mediastinal, hilar, or axillary lymph nodes. Thymic remnant in the anterior mediastinum. Thyroid gland, trachea, and esophagus demonstrate no significant findings. Lungs/Pleura: Lungs are clear. No pleural effusion or pneumothorax. Upper Abdomen: No acute abnormality. Musculoskeletal: No chest wall abnormality. No acute osseous findings. Review of the MIP images confirms the  above findings. IMPRESSION: 1. Negative examination for pulmonary embolism. 2. Pneumomediastinum and emphysema within the included lower neck. No obvious etiology. 3. No pneumothorax. 4. Solitary enlarged right axillary lymph node measuring 2.2 x 1.7 cm, of uncertain significance. No other enlarged mediastinal, hilar, or axillary lymph nodes. Electronically Signed   By: Jearld Lesch M.D.   On: 05/29/2022 17:35    EKG: Independently reviewed.  Sinus tachycardia.  Assessment and Plan  Spontaneous pneumomediastinum Etiology unknown.  No pneumothorax seen on CT.  Patient is currently  hemodynamically stable.  Cardiothoracic surgery consulted and recommended esophagram.  Keep n.p.o., IV fluid hydration, and continue pain management.  Avoidance of maneuvers which increase pulmonary pressure.  Asthma Stable, no signs of acute exacerbation.  Anxiety, ADHD Hold p.o. meds at this time.  DVT prophylaxis: SCDs Code Status: Full Code (discussed with the patient) Level of care: Progressive Care Unit Admission status: It is my clinical opinion that admission to INPATIENT is reasonable and necessary because of the expectation that this patient will require hospital care that crosses at least 2 midnights to treat this condition based on the medical complexity of the problems presented.  Given the aforementioned information, the predictability of an adverse outcome is felt to be significant.   John Giovanni MD [facility]  If 7PM-7AM, please contact night-coverage [email or web address]  05/30/2022, 5:42 AM

## 2022-05-30 NOTE — ED Notes (Signed)
Report called to Harriett Sine, nurse on accepting unit. Awaiting [facility] transport.

## 2022-05-30 NOTE — ED Notes (Signed)
Patient ambulatory to restroom  ?

## 2022-05-30 NOTE — Progress Notes (Signed)
Transported to radiology for esophagram by bed awake and alert.

## 2022-05-30 NOTE — Plan of Care (Signed)

## 2022-05-30 NOTE — Progress Notes (Signed)
Received jpatient from [facility] via [facility].  Patient alert and oriented, NAD, VSS.  Oriented to room.  [facility] paged and made aware of admission to floor.

## 2022-05-30 NOTE — Consult Note (Signed)
[address]       [place] [address]             [phone]                    Syer Verhoef [facility] Medical Record #[id number] Date of Birth: Feb 02, 1995  Referring: No ref. provider found Primary Care: Eden Emms, NP Primary Cardiologist: None  Chief Complaint:    Chief Complaint  Patient presents with   Neck Pain   Chest Pain    History of Present Illness:    Michael Lam 27 y.o. male transferred from [place] with a pneumomediastinum.  He presented with a 2-3day hx of worsening chest pain and dysphagia.  He denies any dyspnea, coughing or emesis.  He does have a hx of vaping.    Past Medical History:  Diagnosis Date   Allergy    Anxiety    Asthma    Pilonidal cyst    Recurrent upper respiratory infection (URI)     Past Surgical History:  Procedure Laterality Date   ADENOIDECTOMY     SKIN BIOPSY  10/07/2018   right 5th digit.    TONSILLECTOMY      Family History  Problem Relation Age of Onset   Thyroid disease Mother    Chiari malformation Mother    Hyperlipidemia Father    Hypertension Father    Diabetes Father    Rheum arthritis Father    Other Father        alpha thylosemia   Fibromyalgia Father    Lupus Sister    Thyroid disease Brother    Diabetes Maternal Grandmother    Migraines Maternal Grandmother    Rheum arthritis Maternal Grandmother    Stroke Maternal Grandfather    Hypertension Maternal Grandfather    Hypertension Paternal Grandmother    Asthma Paternal Grandmother    Migraines Paternal Grandmother    Stroke Paternal Grandfather      Social History   Tobacco Use  Smoking Status Never   Passive exposure: Never  Smokeless Tobacco Never  Tobacco Comments   Pt currently vapes.  Has nicotine.    Social History   Substance and Sexual Activity  Alcohol Use Yes   Comment: occ     Allergies  Allergen Reactions   Methotrexate Derivatives Hives, Itching and Other (See  Comments)    Itchy rash on upper body and face after 1 dose   Peanut Oil Rash   Robaxin [Methocarbamol] Other (See Comments)    Causes headache, fever, dizziness    Egg White (Egg Protein) Other (See Comments)    Abdominal Pain   Norco [Hydrocodone-Acetaminophen] Itching, Rash and Other (See Comments)    Rash on thigh and upper body with itching   Plaquenil [Hydroxychloroquine] Itching, Rash and Other (See Comments)    Entire body itches    Current Facility-Administered Medications  Medication Dose Route Frequency Provider Last Rate Last Admin   0.9 %  sodium chloride infusion   Intravenous Continuous John Giovanni, MD 125 mL/hr at 05/30/22 0800 Infusion Verify at 05/30/22 0800   HYDROmorphone (DILAUDID) injection 1 mg  1 mg Intravenous Q4H PRN John Giovanni, MD   1 mg at 05/30/22 0756   iohexol (OMNIPAQUE) 300 MG/ML solution 100 mL  100 mL Oral Once PRN Tegeler, Canary Brim, MD       naloxone [facility]) injection 0.4 mg  0.4 mg Intravenous PRN John Giovanni, MD  ondansetron (ZOFRAN) injection 4 mg  4 mg Intravenous Q6H PRN John Giovanni, MD   4 mg at 05/30/22 0756    Review of Systems  Constitutional:  Negative for fever and malaise/fatigue.  Cardiovascular:  Positive for chest pain.  Neurological: Negative.     PHYSICAL EXAMINATION: BP 102/65 (BP Location: Left Arm)   Pulse 66   Temp 98 F (36.7 C) (Oral)   Resp 15   Ht 5\' 11"  (1.803 m)   Wt 97.1 kg   SpO2 97%   BMI 29.86 kg/m   Physical Exam Constitutional:      General: He is not in acute distress.    Appearance: He is well-developed and normal weight.  HENT:     Head: Normocephalic and atraumatic.  Cardiovascular:     Rate and Rhythm: Normal rate and regular rhythm.  Pulmonary:     Effort: Pulmonary effort is normal.  Neurological:     General: No focal deficit present.     Mental Status: He is alert and oriented to person, place, and time.      Diagnostic Studies & Laboratory  data:     Recent Radiology Findings:   CT Soft Tissue Neck W Contrast  Addendum Date: 05/29/2022   ADDENDUM REPORT: 05/29/2022 18:22 ADDENDUM: These results were called by telephone at the time of interpretation on 05/29/2022 at 6:15 pm to provider [facility] , who verbally acknowledged these results. Electronically Signed   By: Jackey Loge D.O.   On: 05/29/2022 18:22   Result Date: 05/29/2022 CLINICAL DATA:  Provided history: Epiglottitis or tonsillitis suspected. Pleuritic chest pain. Neck pain. Shortness of breath. Patient felt crepitance at home in neck. Fever. Tachycardia. EXAM: CT NECK WITH CONTRAST TECHNIQUE: Multidetector CT imaging of the neck was performed using the standard protocol following the bolus administration of intravenous contrast. RADIATION DOSE REDUCTION: This exam was performed according to the departmental dose-optimization program which includes automated exposure control, adjustment of the mA and/or kV according to patient size and/or use of iterative reconstruction technique. CONTRAST:  75mL OMNIPAQUE IOHEXOL 350 MG/ML SOLN COMPARISON:  Same day chest CT 05/29/2022. CT angiogram head/neck 09/05/2021. Maxillofacial CT 03/27/2020. FINDINGS: Pharynx and larynx: Streak/beam hardening artifact arising from dental restoration partially obscures the oral cavity. No appreciable swelling or mass within the oral cavity, pharynx or larynx. Salivary glands: No inflammation, mass, or stone. Thyroid: Unremarkable. Lymph nodes: Nonspecific mildly enlarged bilateral level III lymph nodes, measuring up to 12 mm in short axis (for instance as seen on series 4, image 50) (series 5, image 18). Vascular: The major vascular structures of the neck are patent. Limited intracranial: No evidence of an acute intracranial abnormality within the field of view. Visualized orbits: No orbital mass or acute orbital finding. Mastoids and visualized paranasal sinuses: Portions of the left frontal sinus are  excluded from the field of view superiorly. Trace mucosal thickening within the right maxillary sinus. No significant mastoid effusion. Skeleton: Nonspecific reversal of the expected cervical lordosis. No acute fracture or aggressive osseous lesion. Upper chest: Moderate-volume pneumomediastinum at the imaged levels. No airspace consolidation at the imaged levels. No visible pneumothorax. Other: There is moderate-volume gas within the deep neck spaces, extending into the partially imaged upper mediastinum. IMPRESSION: 1. Moderate-volume gas within the deep neck spaces, extending into the partially imaged mediastinum. No definite source is identified within the neck. No fluid collection is identified within the neck. No appreciable inflammatory changes within the imaged aerodigestive tract. Please correlate with findings on  the concurrently performed chest CT. 2. Nonspecific mildly enlarged bilateral level III lymph nodes. Electronically Signed: By: Jackey Loge D.O. On: 05/29/2022 17:44   CT Angio Chest PE W and/or Wo Contrast  Result Date: 05/29/2022 CLINICAL DATA:  PE suspected, fever, tachycardia EXAM: CT ANGIOGRAPHY CHEST WITH CONTRAST TECHNIQUE: Multidetector CT imaging of the chest was performed using the standard protocol during bolus administration of intravenous contrast. Multiplanar CT image reconstructions and MIPs were obtained to evaluate the vascular anatomy. RADIATION DOSE REDUCTION: This exam was performed according to the departmental dose-optimization program which includes automated exposure control, adjustment of the mA and/or kV according to patient size and/or use of iterative reconstruction technique. CONTRAST:  75mL OMNIPAQUE IOHEXOL 350 MG/ML SOLN COMPARISON:  None Available. FINDINGS: Cardiovascular: Satisfactory opacification of the pulmonary arteries to the segmental level. No evidence of pulmonary embolism. Normal heart size. No pericardial effusion. Mediastinum/Nodes:  Pneumomediastinum and emphysema within the included lower neck (series 5, image 97). Solitary enlarged right axillary lymph node measuring 2.2 x 1.7 cm (series 5, image 61). No other enlarged mediastinal, hilar, or axillary lymph nodes. Thymic remnant in the anterior mediastinum. Thyroid gland, trachea, and esophagus demonstrate no significant findings. Lungs/Pleura: Lungs are clear. No pleural effusion or pneumothorax. Upper Abdomen: No acute abnormality. Musculoskeletal: No chest wall abnormality. No acute osseous findings. Review of the MIP images confirms the above findings. IMPRESSION: 1. Negative examination for pulmonary embolism. 2. Pneumomediastinum and emphysema within the included lower neck. No obvious etiology. 3. No pneumothorax. 4. Solitary enlarged right axillary lymph node measuring 2.2 x 1.7 cm, of uncertain significance. No other enlarged mediastinal, hilar, or axillary lymph nodes. Electronically Signed   By: Jearld Lesch M.D.   On: 05/29/2022 17:35   DG Chest 2 View  Result Date: 05/02/2022 CLINICAL DATA:  Chest pain. EXAM: CHEST - 2 VIEW COMPARISON:  December 28, 2017. FINDINGS: The heart size and mediastinal contours are within normal limits. Both lungs are clear. The visualized skeletal structures are unremarkable. IMPRESSION: No active cardiopulmonary disease. Electronically Signed   By: Lupita Raider M.D.   On: 05/02/2022 18:44       I have independently reviewed the above radiology studies  and reviewed the findings with the patient.   Recent Lab Findings: Lab Results  Component Value Date   WBC 8.0 05/29/2022   HGB 14.8 05/29/2022   HCT 44.2 05/29/2022   PLT 186 05/29/2022   GLUCOSE 87 05/29/2022   CHOL 156 11/26/2021   TRIG 63.0 11/26/2021   HDL 48.90 11/26/2021   LDLCALC 94 11/26/2021   ALT 23 05/29/2022   AST 24 05/29/2022   NA 137 05/29/2022   K 4.2 05/29/2022   CL 105 05/29/2022   CREATININE 1.01 05/29/2022   BUN 14 05/29/2022   CO2 19 (L) 05/29/2022    TSH 1.010 05/02/2022   INR 1.1 09/20/2021   HGBA1C 5.2 11/26/2021      Assessment / Plan:   26yo male with pneumomediastinum.  No evidence of pleural effusion or pneumothorax.  Unclear etiology.  Would recommend obtaining a swallow study to rule out esophageal injury.  If negative, can progress diet, and pain control.  Carla Drape Lilya Smitherman 05/30/2022 8:40 AM

## 2022-05-30 NOTE — Discharge Summary (Signed)
Physician Discharge Summary  Michael Lam ZOX:[id number] DOB: 1995/10/28 DOA: 05/29/2022  PCP: Eden Emms, NP  Admit date: 05/29/2022 Discharge date: 05/30/2022  Admitted From: Home Discharge disposition: Home  Recommendations at discharge:  Avoid maneuvers which increase chest pressure. Follow-up with CT surgery as an outpatient in a month for repeat x-ray. Avoid vaping You been started on tramadol as the pain medicine to be used only as needed.  Please avoid driving or operating heavy machinery while under the influence of opiate.  Brief narrative: Michael Lam is a 27 y.o. male with PMH significant for asthma, anxiety, ADHD 5/9, patient presented to the ED for evaluation of pain in his anterior neck and chest associated with difficulty swallowing for the last 4 days.  He has also noticed a cracking sound when he touches his neck.  He recalls going to the gym 4 days ago and lifting 30 to 40 pounds of weight doing chest press which is not new for him.  No any other obvious trauma.  In the ED, he was tachycardic to 100s  Was unremarkable, CMP with slightly low CO2 at 19, otherwise unremarkable, BNP, troponin, lactic acid unremarkable Rest virus panel unremarkable CT angio of chest rule out pulm embolism aortic showed pneumomediastinum and emphysema within the included lower neck without any obvious etiology.  There was no pneumothorax.  It also showed a solitary enlarged right axillary lymph node measuring 2.2 x 1.7 cm, of uncertain significance.    CT soft tissue neck with contrast was obtained which showed moderate-volume gas within the deep neck spaces, extending into the partially imaged mediastinum without any definite source identified within the neck. No inflammatory changes or fluid collection is identified within the neck. ED physician discussed the case with Dr. Cliffton Asters with cardiothoracic surgery who recommended medical admission and obtaining  esophagram.   Recommended holding off antibiotics  Admitted to [facility]  Cardiothoracic surgery to see in the morning.  Subjective: Patient was seen and examined this morning.  Young male.  Not in distress.  Pain improving. Underwent defecogram this morning, negative for leak. Chart reviewed. Vital signs overnight stable  Hospital course: Spontaneous pneumomediastinum Presented with neck pain, fullness, crackling sound and difficulty swallowing for 4 days  CT imaging neck and chest as above showing pneumomediastinum without any pneumothorax, inflammatory changes, fluid collection or mass.  Hemodynamically stable Thoracic surgery Dr. Cliffton Asters was consulted. Esophagram was obtained which did not show any leak. I discussed the case and esophagram with Dr. Cliffton Asters.  No further intervention required at this time.  Patient was started on diet which she tolerated.  Pain is improving.  Will discharge the patient on as needed oral tramadol.  Discussed about the risk and benefits of opiates.  To follow-up with CT surgery as an outpatient in a month for repeat x-ray. Recommended to avoid maneuvers which increase pulmonary pressure.  Solitary enlarged right axillary lymph node 2.2 x 1.7 cm, unknown certain significance. Follow-up  Asthma Stable, no signs of acute exacerbation.   Anxiety, ADHD PTA on Wellbutrin, Adderall, Ambien Continue the same.  Wounds:  -    Discharge Exam:   Vitals:   05/30/22 0200 05/30/22 0400 05/30/22 0454 05/30/22 0600  BP:   102/65   Pulse: 62 60 74 66  Resp: 18 14 13 15   Temp:   98 F (36.7 C)   TempSrc:   Oral   SpO2: 95% 97% 96% 97%  Weight:      Height:  Body mass index is 29.86 kg/m.   General exam: Pleasant young male.  Propped up in bed.  Not in distress Skin: No rashes, lesions or ulcers. HEENT: Mild neck fullness noted.  Mild crackles on palpation. Lungs: Clear to auscultation bilaterally CVS: Regular rate and rhythm, no  murmur GI/Abd soft, nontender, nondistended, bowel sound present CNS: Alert, awake, oriented x 3 Psychiatry: Mood appropriate Extremities: No pedal edema, no calf tenderness  Follow ups:    Follow-up Information     Eden Emms, NP Follow up.   Specialties: Nurse Practitioner, Family Medicine Contact information: [address] [place] [address] [phone]                 Discharge Instructions:   Discharge Instructions     Call MD for:  difficulty breathing, headache or visual disturbances   Complete by: As directed    Call MD for:  extreme fatigue   Complete by: As directed    Call MD for:  hives   Complete by: As directed    Call MD for:  persistant dizziness or light-headedness   Complete by: As directed    Call MD for:  persistant nausea and vomiting   Complete by: As directed    Call MD for:  severe uncontrolled pain   Complete by: As directed    Call MD for:  temperature >100.4   Complete by: As directed    Diet general   Complete by: As directed    Discharge instructions   Complete by: As directed    Recommendations at discharge:   Avoid maneuvers which increase chest pressure.  Follow-up with CT surgery as an outpatient in a month for repeat x-ray.  Avoid vaping  You been started on tramadol as the pain medicine to be used only as needed.  Please avoid driving or operating heavy machinery while under the influence of opiate.    General discharge instructions: Follow with Primary MD Eden Emms, NP in 7 days  Please request your PCP  to go over your hospital tests, procedures, radiology results at the follow up. Please get your medicines reviewed and adjusted.  Your PCP may decide to repeat certain labs or tests as needed. Do not drive, operate heavy machinery, perform activities at heights, swimming or participation in water activities or provide baby sitting services if your were admitted for syncope or siezures until you have  seen by Primary MD or a Neurologist and advised to do so again. [place] Controlled Substance Reporting System database was reviewed. Do not drive, operate heavy machinery, perform activities at heights, swim, participate in water activities or provide baby-sitting services while on medications for pain, sleep and mood until your outpatient physician has reevaluated you and advised to do so again.  You are strongly recommended to comply with the dose, frequency and duration of prescribed medications. Activity: As tolerated with Full fall precautions use walker/cane & assistance as needed Avoid using any recreational substances like cigarette, tobacco, alcohol, or non-prescribed drug. If you experience worsening of your admission symptoms, develop shortness of breath, life threatening emergency, suicidal or homicidal thoughts you must seek medical attention immediately by calling 911 or calling your MD immediately  if symptoms less severe. You must read complete instructions/literature along with all the possible adverse reactions/side effects for all the medicines you take and that have been prescribed to you. Take any new medicine only after you have completely understood and accepted all the possible  adverse reactions/side effects.  Wear Seat belts while driving. You were cared for by a hospitalist during your hospital stay. If you have any questions about your discharge medications or the care you received while you were in the hospital after you are discharged, you can call the unit and ask to speak with the hospitalist or the covering physician. Once you are discharged, your primary care physician will handle any further medical issues. Please note that NO REFILLS for any discharge medications will be authorized once you are discharged, as it is imperative that you return to your primary care physician (or establish a relationship with a primary care physician if you do not have one).   Increase  activity slowly   Complete by: As directed        Discharge Medications:   Allergies as of 05/30/2022       Reactions   Methotrexate Derivatives Hives, Itching, Other (See Comments)   Itchy rash on upper body and face after 1 dose   Peanut Oil Rash   Robaxin [methocarbamol] Other (See Comments)   Causes headache, fever, dizziness    Egg White (egg Protein) Other (See Comments)   Abdominal Pain   Norco [hydrocodone-acetaminophen] Itching, Rash, Other (See Comments)   Rash on thigh and upper body with itching   Plaquenil [hydroxychloroquine] Itching, Rash, Other (See Comments)   Entire body itches        Medication List     STOP taking these medications    aspirin-acetaminophen-caffeine 250-250-65 MG tablet Commonly known as: EXCEDRIN MIGRAINE       TAKE these medications    amphetamine-dextroamphetamine 20 MG 24 hr capsule Commonly known as: Adderall XR Take 1 capsule (20 mg total) by mouth daily.   buPROPion 150 MG 24 hr tablet Commonly known as: Wellbutrin XL Take 1 tablet (150 mg total) by mouth daily.   fexofenadine 180 MG tablet Commonly known as: ALLEGRA Take 180 mg by mouth daily.   ketoconazole 2 % cream Commonly known as: NIZORAL Apply 1 Application topically daily. For two weeks   lidocaine 5 % ointment Commonly known as: XYLOCAINE Apply 1 Application topically 4 (four) times daily as needed.   naproxen 500 MG tablet Commonly known as: NAPROSYN Take 1 tablet (500 mg total) by mouth 2 (two) times daily with a meal.   ondansetron 4 MG disintegrating tablet Commonly known as: ZOFRAN-ODT Take 1 tablet (4 mg total) by mouth every 8 (eight) hours as needed for nausea or vomiting.   senna-docusate 8.6-50 MG tablet Commonly known as: Senokot-S Take 1 tablet by mouth at bedtime as needed for mild constipation or moderate constipation.   traMADol 50 MG tablet Commonly known as: ULTRAM Take 1 tablet (50 mg total) by mouth every 8 (eight) hours as  needed for up to 5 days for severe pain.   triamcinolone cream 0.1 % Commonly known as: KENALOG Apply 1 Application topically 2 (two) times daily.   zolpidem 10 MG tablet Commonly known as: AMBIEN Take 1 tablet (10 mg total) by mouth at bedtime as needed for sleep.         The results of significant diagnostics from this hospitalization (including imaging, microbiology, ancillary and laboratory) are listed below for reference.    Procedures and Diagnostic Studies:   DG ESOPHAGUS W SINGLE CM (SOL OR THIN BA)  Result Date: 05/30/2022 CLINICAL DATA:  Provided history: Pneumomediastinum. EXAM: ESOPHAGUS/BARIUM SWALLOW TECHNIQUE: A water-soluble contrast esophagram was performed. The exam was performed by Anders Grant,  NP, and was supervised and interpreted by Dr. Jackey Loge. FLUOROSCOPY: Fluoroscopy time: 1 minute 36 seconds (61.4 mGy). COMPARISON:  Neck CT 05/29/2022. Chest CT 05/29/2022. FINDINGS: A problem-oriented water-soluble esophagram was performed to assess for leak. There was prompt passage of contrast. The esophagus was normal in caliber and smooth in contour. No extraluminal contrast was demonstrated to suggest a leak. No appreciable hiatal hernia. IMPRESSION: Problem-oriented water-soluble esophagram demonstrating no evidence of contrast leak. Electronically Signed   By: Jackey Loge D.O.   On: 05/30/2022 09:15   CT Soft Tissue Neck W Contrast  Addendum Date: 05/29/2022   ADDENDUM REPORT: 05/29/2022 18:22 ADDENDUM: These results were called by telephone at the time of interpretation on 05/29/2022 at 6:15 pm to provider [facility] , who verbally acknowledged these results. Electronically Signed   By: Jackey Loge D.O.   On: 05/29/2022 18:22   Result Date: 05/29/2022 CLINICAL DATA:  Provided history: Epiglottitis or tonsillitis suspected. Pleuritic chest pain. Neck pain. Shortness of breath. Patient felt crepitance at home in neck. Fever. Tachycardia. EXAM: CT NECK WITH  CONTRAST TECHNIQUE: Multidetector CT imaging of the neck was performed using the standard protocol following the bolus administration of intravenous contrast. RADIATION DOSE REDUCTION: This exam was performed according to the departmental dose-optimization program which includes automated exposure control, adjustment of the mA and/or kV according to patient size and/or use of iterative reconstruction technique. CONTRAST:  75mL OMNIPAQUE IOHEXOL 350 MG/ML SOLN COMPARISON:  Same day chest CT 05/29/2022. CT angiogram head/neck 09/05/2021. Maxillofacial CT 03/27/2020. FINDINGS: Pharynx and larynx: Streak/beam hardening artifact arising from dental restoration partially obscures the oral cavity. No appreciable swelling or mass within the oral cavity, pharynx or larynx. Salivary glands: No inflammation, mass, or stone. Thyroid: Unremarkable. Lymph nodes: Nonspecific mildly enlarged bilateral level III lymph nodes, measuring up to 12 mm in short axis (for instance as seen on series 4, image 50) (series 5, image 18). Vascular: The major vascular structures of the neck are patent. Limited intracranial: No evidence of an acute intracranial abnormality within the field of view. Visualized orbits: No orbital mass or acute orbital finding. Mastoids and visualized paranasal sinuses: Portions of the left frontal sinus are excluded from the field of view superiorly. Trace mucosal thickening within the right maxillary sinus. No significant mastoid effusion. Skeleton: Nonspecific reversal of the expected cervical lordosis. No acute fracture or aggressive osseous lesion. Upper chest: Moderate-volume pneumomediastinum at the imaged levels. No airspace consolidation at the imaged levels. No visible pneumothorax. Other: There is moderate-volume gas within the deep neck spaces, extending into the partially imaged upper mediastinum. IMPRESSION: 1. Moderate-volume gas within the deep neck spaces, extending into the partially imaged  mediastinum. No definite source is identified within the neck. No fluid collection is identified within the neck. No appreciable inflammatory changes within the imaged aerodigestive tract. Please correlate with findings on the concurrently performed chest CT. 2. Nonspecific mildly enlarged bilateral level III lymph nodes. Electronically Signed: By: Jackey Loge D.O. On: 05/29/2022 17:44   CT Angio Chest PE W and/or Wo Contrast  Result Date: 05/29/2022 CLINICAL DATA:  PE suspected, fever, tachycardia EXAM: CT ANGIOGRAPHY CHEST WITH CONTRAST TECHNIQUE: Multidetector CT imaging of the chest was performed using the standard protocol during bolus administration of intravenous contrast. Multiplanar CT image reconstructions and MIPs were obtained to evaluate the vascular anatomy. RADIATION DOSE REDUCTION: This exam was performed according to the departmental dose-optimization program which includes automated exposure control, adjustment of the mA and/or kV according to  patient size and/or use of iterative reconstruction technique. CONTRAST:  75mL OMNIPAQUE IOHEXOL 350 MG/ML SOLN COMPARISON:  None Available. FINDINGS: Cardiovascular: Satisfactory opacification of the pulmonary arteries to the segmental level. No evidence of pulmonary embolism. Normal heart size. No pericardial effusion. Mediastinum/Nodes: Pneumomediastinum and emphysema within the included lower neck (series 5, image 97). Solitary enlarged right axillary lymph node measuring 2.2 x 1.7 cm (series 5, image 61). No other enlarged mediastinal, hilar, or axillary lymph nodes. Thymic remnant in the anterior mediastinum. Thyroid gland, trachea, and esophagus demonstrate no significant findings. Lungs/Pleura: Lungs are clear. No pleural effusion or pneumothorax. Upper Abdomen: No acute abnormality. Musculoskeletal: No chest wall abnormality. No acute osseous findings. Review of the MIP images confirms the above findings. IMPRESSION: 1. Negative examination for  pulmonary embolism. 2. Pneumomediastinum and emphysema within the included lower neck. No obvious etiology. 3. No pneumothorax. 4. Solitary enlarged right axillary lymph node measuring 2.2 x 1.7 cm, of uncertain significance. No other enlarged mediastinal, hilar, or axillary lymph nodes. Electronically Signed   By: Jearld Lesch M.D.   On: 05/29/2022 17:35     Labs:   Basic Metabolic Panel: Recent Labs  Lab 05/29/22 1523 05/30/22 1019  NA 137 136  K 4.2 4.2  CL 105 105  CO2 19* 22  GLUCOSE 87 71  BUN 14 17  CREATININE 1.01 0.97  CALCIUM 9.8 8.9   GFR Estimated Creatinine Clearance: 135.9 mL/min (by C-G formula based on SCr of 0.97 mg/dL). Liver Function Tests: Recent Labs  Lab 05/29/22 1523  AST 24  ALT 23  ALKPHOS 40  BILITOT 0.7  PROT 7.7  ALBUMIN 4.7   No results for input(s): "LIPASE", "AMYLASE" in the last 168 hours. No results for input(s): "AMMONIA" in the last 168 hours. Coagulation profile No results for input(s): "INR", "PROTIME" in the last 168 hours.  CBC: Recent Labs  Lab 05/29/22 1523  WBC 8.0  NEUTROABS 4.9  HGB 14.8  HCT 44.2  MCV 84.7  PLT 186   Cardiac Enzymes: No results for input(s): "CKTOTAL", "CKMB", "CKMBINDEX", "TROPONINI" in the last 168 hours. BNP: Invalid input(s): "POCBNP" CBG: No results for input(s): "GLUCAP" in the last 168 hours. D-Dimer No results for input(s): "DDIMER" in the last 72 hours. Hgb A1c No results for input(s): "HGBA1C" in the last 72 hours. Lipid Profile No results for input(s): "CHOL", "HDL", "LDLCALC", "TRIG", "CHOLHDL", "LDLDIRECT" in the last 72 hours. Thyroid function studies No results for input(s): "TSH", "T4TOTAL", "T3FREE", "THYROIDAB" in the last 72 hours.  Invalid input(s): "FREET3" Anemia work up No results for input(s): "VITAMINB12", "FOLATE", "FERRITIN", "TIBC", "IRON", "RETICCTPCT" in the last 72 hours. Microbiology Recent Results (from the past 240 hour(s))  Blood culture (routine x 2)      Status: None (Preliminary result)   Collection Time: 05/29/22  3:40 PM   Specimen: BLOOD  Result Value Ref Range Status   Specimen Description   Final    BLOOD BLOOD RIGHT FOREARM Performed at [facility], [address], [place], [place] [address]    Special Requests   Final    BOTTLES DRAWN AEROBIC AND ANAEROBIC Blood Culture adequate volume Performed at [facility], [address] [place], [place] [address]    Culture   Final    NO GROWTH < 24 HOURS Performed at [facility], [address], [place], [place] [address]    Report Status PENDING  Incomplete  Blood culture (routine x 2)     Status:  None (Preliminary result)   Collection Time: 05/29/22  3:50 PM   Specimen: BLOOD  Result Value Ref Range Status   Specimen Description   Final    BLOOD BLOOD LEFT FOREARM Performed at [facility], [address] [place], [place] [address]    Special Requests   Final    BOTTLES DRAWN AEROBIC AND ANAEROBIC Blood Culture adequate volume Performed at [facility], [address], [place], [place] [address]    Culture   Final    NO GROWTH < 24 HOURS Performed at [facility], [address] [place], [place] [address]    Report Status PENDING  Incomplete  SARS Coronavirus 2 by RT PCR (hospital order, performed in [facility] hospital lab) *cepheid single result test* Anterior Nasal Swab     Status: None   Collection Time: 05/29/22  4:00 PM   Specimen: Anterior Nasal Swab  Result Value Ref Range Status   SARS Coronavirus 2 by RT PCR NEGATIVE NEGATIVE Final    Comment: (NOTE) SARS-CoV-2 target nucleic acids are NOT DETECTED.  The SARS-CoV-2 RNA is generally detectable in upper and lower respiratory specimens during the acute phase of infection. The lowest concentration of SARS-CoV-2 viral copies this assay can detect is 250 copies / mL. A negative result does not preclude SARS-CoV-2  infection and should not be used as the sole basis for treatment or other patient management decisions.  A negative result may occur with improper specimen collection / handling, submission of specimen other than nasopharyngeal swab, presence of viral mutation(s) within the areas targeted by this assay, and inadequate number of viral copies (<250 copies / mL). A negative result must be combined with clinical observations, patient history, and epidemiological information.  Fact Sheet for Patients:   RoadLapTop.co.za  Fact Sheet for Healthcare Providers: http://kim-miller.com/  This test is not yet approved or  cleared by the [place] FDA and has been authorized for detection and/or diagnosis of SARS-CoV-2 by FDA under an Emergency Use Authorization (EUA).  This EUA will remain in effect (meaning this test can be used) for the duration of the COVID-19 declaration under Section 564(b)(1) of the Act, 21 U.S.C. section 360bbb-3(b)(1), unless the authorization is terminated or revoked sooner.  Performed at [facility], [address] [place], [place] [address]   MRSA Next Gen by PCR, Nasal     Status: None   Collection Time: 05/30/22  1:14 AM   Specimen: Nasal Mucosa; Nasal Swab  Result Value Ref Range Status   MRSA by PCR Next Gen NOT DETECTED NOT DETECTED Final    Comment: (NOTE) The GeneXpert MRSA Assay (FDA approved for NASAL specimens only), is one component of a comprehensive MRSA colonization surveillance program. It is not intended to diagnose MRSA infection nor to guide or monitor treatment for MRSA infections. Test performance is not FDA approved in patients less than 56 years old. Performed at [facility], [address] [place], [place] [address]     Time coordinating discharge: 45 minutes  Signed: Nikholas Geffre  [facility] 05/30/2022, 1:02 PM

## 2022-05-31 LAB — CULTURE, BLOOD (ROUTINE X 2)

## 2022-06-01 LAB — CULTURE, BLOOD (ROUTINE X 2): Special Requests: ADEQUATE

## 2022-06-02 ENCOUNTER — Telehealth: Payer: Self-pay

## 2022-06-02 LAB — CULTURE, BLOOD (ROUTINE X 2): Special Requests: ADEQUATE

## 2022-06-02 NOTE — Transitions of Care (Post Inpatient/ED Visit) (Unsigned)
   06/02/2022  Name: Michael Lam MRN: [id number] DOB: 09-03-1995  Today's TOC FU Call Status: Today's TOC FU Call Status:: Unsuccessul Call (1st Attempt) Unsuccessful Call (1st Attempt) Date: 06/02/22  Attempted to reach the patient regarding the most recent Inpatient/ED visit.  Follow Up Plan: Additional outreach attempts will be made to reach the patient to complete the Transitions of Care (Post Inpatient/ED visit) call.   Signature  Agnes Lawrence, CMA (AAMA)  [facility] [phone]

## 2022-06-03 LAB — CULTURE, BLOOD (ROUTINE X 2): Culture: NO GROWTH

## 2022-06-03 NOTE — Transitions of Care (Post Inpatient/ED Visit) (Signed)
06/03/2022  Name: Michael Lam MRN: [id number] DOB: 26-Jul-1995  Today's TOC FU Call Status: Today's TOC FU Call Status:: Successful TOC FU Call Competed Unsuccessful Call (1st Attempt) Date: 06/02/22 [facility] FU Call Complete Date: 06/03/22  Transition Care Management Follow-up Telephone Call Date of Discharge: 05/30/22 Discharge Facility: [facility]) Type of Discharge: Inpatient Admission Primary Inpatient Discharge Diagnosis:: pneumomediastinum How have you been since you were released from the hospital?: Better Any questions or concerns?: No  Items Reviewed: Did you receive and understand the discharge instructions provided?: Yes Medications obtained,verified, and reconciled?: Yes (Medications Reviewed) Any new allergies since your discharge?: No Dietary orders reviewed?: NA Do you have support at home?: Yes  Medications Reviewed Today: Medications Reviewed Today     Reviewed by Leigh Aurora, CMA (Certified Medical Assistant) on 06/03/22 at 1812  Med List Status: <None>   Medication Order Taking? Sig Documenting Provider Last Dose Status Informant  amphetamine-dextroamphetamine (ADDERALL XR) 20 MG 24 hr capsule [id number] Yes Take 1 capsule (20 mg total) by mouth daily.  Taking Active Self, Pharmacy Records  buPROPion (WELLBUTRIN XL) 150 MG 24 hr tablet [id number]  Take 1 tablet (150 mg total) by mouth daily.  Patient not taking: Reported on 05/30/2022   Eden Emms, NP  Active Self, Pharmacy Records  fexofenadine [facility]) 180 MG tablet [id number]  Take 180 mg by mouth daily. [provider]  Active Self, Pharmacy Records  ketoconazole (NIZORAL) 2 % cream [id number]  Apply 1 Application topically daily. For two weeks Eden Emms, NP  Active Self, Pharmacy Records  lidocaine (XYLOCAINE) 5 % ointment [id number]  Apply 1 Application topically 4 (four) times daily as needed.  Patient not taking: Reported on 05/30/2022   Radford Pax, NP  Active  Self, Pharmacy Records  naproxen (NAPROSYN) 500 MG tablet [id number]  Take 1 tablet (500 mg total) by mouth 2 (two) times daily with a meal.  Patient not taking: Reported on 05/30/2022   Wallis Bamberg, PA-C  Active Self, Pharmacy Records  ondansetron (ZOFRAN-ODT) 4 MG disintegrating tablet [id number] Yes Take 1 tablet (4 mg total) by mouth every 8 (eight) hours as needed for nausea or vomiting. Eden Emms, NP Taking Active Self, Pharmacy Records  senna-docusate (SENOKOT-S) 8.6-50 MG tablet [id number]  Take 1 tablet by mouth at bedtime as needed for mild constipation or moderate constipation.  Patient not taking: Reported on 05/30/2022   Maia Plan, MD  Active Self, Pharmacy Records  traMADol Janean Sark) 50 MG tablet [id number] Yes Take 1 tablet (50 mg total) by mouth every 8 (eight) hours as needed for up to 5 days for severe pain. Lorin Glass, MD Taking Active   triamcinolone cream (KENALOG) 0.1 % [id number]  Apply 1 Application topically 2 (two) times daily.  Patient not taking: Reported on 05/30/2022   Eden Emms, NP  Active Self, Pharmacy Records  zolpidem [facility]) 10 MG tablet [id number]  Take 1 tablet (10 mg total) by mouth at bedtime as needed for sleep.  Patient not taking: Reported on 05/30/2022   Tomma Lightning, MD  Expired 02/16/22 2359             Home Care and Equipment/Supplies: Were Home Health Services Ordered?: NA Any new equipment or medical supplies ordered?: NA  Functional Questionnaire: Do you need assistance with bathing/showering or dressing?: No Do you need assistance with meal preparation?: No Do you need assistance with eating?: No Do you have difficulty maintaining continence: No Do you  need assistance with getting out of bed/getting out of a chair/moving?: No Do you have difficulty managing or taking your medications?: No  Follow up appointments reviewed: PCP Follow-up appointment confirmed?: Yes Date of PCP follow-up appointment?:  06/06/22 Follow-up Provider: Mordecai Maes, NP Specialist Hospital Follow-up appointment confirmed?: NA Do you need transportation to your follow-up appointment?: No Do you understand care options if your condition(s) worsen?: Yes-patient verbalized understanding    SIGNATURE  Agnes Lawrence, CMA (AAMA)  [facility] [phone]

## 2022-06-06 ENCOUNTER — Other Ambulatory Visit (HOSPITAL_COMMUNITY): Payer: Self-pay

## 2022-06-06 ENCOUNTER — Ambulatory Visit (INDEPENDENT_AMBULATORY_CARE_PROVIDER_SITE_OTHER): Payer: Commercial Managed Care - PPO | Admitting: Nurse Practitioner

## 2022-06-06 ENCOUNTER — Encounter: Payer: Self-pay | Admitting: Nurse Practitioner

## 2022-06-06 ENCOUNTER — Ambulatory Visit (INDEPENDENT_AMBULATORY_CARE_PROVIDER_SITE_OTHER)
Admission: RE | Admit: 2022-06-06 | Discharge: 2022-06-06 | Disposition: A | Discharge status: Home / Self Care | Payer: Commercial Managed Care - PPO | Source: Ambulatory Visit | Attending: Nurse Practitioner | Admitting: Nurse Practitioner

## 2022-06-06 VITALS — BP 118/66 | HR 100 | Temp 98.8°F | Resp 16 | Ht 71.0 in | Wt 202.5 lb

## 2022-06-06 DIAGNOSIS — R591 Generalized enlarged lymph nodes: Secondary | ICD-10-CM

## 2022-06-06 DIAGNOSIS — Z09 Encounter for follow-up examination after completed treatment for conditions other than malignant neoplasm: Secondary | ICD-10-CM | POA: Insufficient documentation

## 2022-06-06 DIAGNOSIS — J982 Interstitial emphysema: Secondary | ICD-10-CM

## 2022-06-06 DIAGNOSIS — R12 Heartburn: Secondary | ICD-10-CM

## 2022-06-06 DIAGNOSIS — R1030 Lower abdominal pain, unspecified: Secondary | ICD-10-CM

## 2022-06-06 MED ORDER — OMEPRAZOLE 20 MG PO CPDR
20.0000 mg | DELAYED_RELEASE_CAPSULE | Freq: Every day | ORAL | 0 refills | Status: DC
  Filled 2022-06-06: qty 30, 30d supply, fill #0

## 2022-06-06 NOTE — Assessment & Plan Note (Signed)
Incidental noting of enlarged right axillary lymph node on imaging.  Patient also has level 3 bilateral lymph node enlargement per CT scan of neck unable to palpate these on exam.  Will consider ultrasound in 2 weeks to evaluate lymph node size

## 2022-06-06 NOTE — Patient Instructions (Addendum)
Nice to see you today Follow up with Dr. Cliffton Asters  from the hospital call their office at   Address: [address], [place], [place] [address] Phone: [phone]

## 2022-06-06 NOTE — Assessment & Plan Note (Signed)
Unclear etiology.  Likely secondary to constipation abdominal 2 view done today pending result

## 2022-06-06 NOTE — Assessment & Plan Note (Signed)
Patient was admitted to the hospital and followed by cardiothoracic surgery.  Discharge follow-up today information given to patient to call to set up his 1 month follow-up with cardiothoracic surgery for repeat chest x-ray.  Patient states his symptoms are slowly improving

## 2022-06-06 NOTE — Assessment & Plan Note (Signed)
Will do omeprazole 20 mg once a day for 30 days patient can use as needed famotidine

## 2022-06-06 NOTE — Assessment & Plan Note (Signed)
Reviewed hospital notes consult notes and imaging along with lab work.

## 2022-06-06 NOTE — Progress Notes (Signed)
Established Patient Office Visit  Subjective   Patient ID: Michael Lam, male    DOB: Jul 26, 1995  Age: 27 y.o. MRN: [id number]  Chief Complaint  Patient presents with   Hospitalization Follow-up    HPI  Hospital follow up: patient was seen on 05/29/2022 at an urgent care with esophageal pain and dysphagia.  He was evaluated and referred to the local emergency department.  Patient went to local emergency department and was admitted for pneumomediastinum.  Patient was followed by cardiothoracic surgery and had an esophagram.  No leaks noted unsure  of the etiology of the pneumomediastinum.  Patient does workout but was not doing any large amounts of weight per his report.  He also works as a Financial controller.  States that he has felt somewhat better. States that the pain is still there. States that if he looks down or laughs, burps, coughs he will have the discomfort.   States that at the beginning he was unable to swallow solid food. He is able to eat now but has to be careful. States that he is doing ok with fluids. He just take s a sip first to test it out.  States that he needs FMLA forms filled out. States that he started having sympotms on 05/27/2022  3 times a months for intermittent leave    Review of Systems  Constitutional:  Negative for chills and fever.  Respiratory:  Negative for shortness of breath.   Cardiovascular:  Positive for chest pain. Negative for palpitations.  Neurological:  Negative for headaches.  Psychiatric/Behavioral:  Negative for hallucinations and suicidal ideas.       Objective:     BP 118/66   Pulse 100   Temp 98.8 F (37.1 C)   Resp 16   Ht 5\' 11"  (1.803 m)   Wt 202 lb 8 oz (91.9 kg)   SpO2 98%   BMI 28.24 kg/m  BP Readings from Last 3 Encounters:  06/06/22 118/66  05/30/22 102/65  05/29/22 109/75   Wt Readings from Last 3 Encounters:  06/06/22 202 lb 8 oz (91.9 kg)  05/30/22 214 lb 1.1 oz (97.1 kg)  05/02/22 216 lb 0.8  oz (98 kg)      Physical Exam Vitals and nursing note reviewed.  Constitutional:      Appearance: Normal appearance.  Cardiovascular:     Rate and Rhythm: Normal rate and regular rhythm.     Heart sounds: Normal heart sounds.  Pulmonary:     Effort: Pulmonary effort is normal.     Breath sounds: Normal breath sounds.  Abdominal:     General: Bowel sounds are normal. There is no distension.     Palpations: There is no mass.     Tenderness: There is abdominal tenderness.     Hernia: No hernia is present.  Lymphadenopathy:     Cervical: No cervical adenopathy.  Neurological:     Mental Status: He is alert.      No results found for any visits on 06/06/22.    The ASCVD Risk score (Arnett DK, et al., 2019) failed to calculate for the following reasons:   The 2019 ASCVD risk score is only valid for ages 36 to 47    Assessment & Plan:   Problem List Items Addressed This Visit       Cardiovascular and Mediastinum   Pneumomediastinum [facility]) - Primary    Patient was admitted to the hospital and followed by cardiothoracic surgery.  Discharge follow-up today information  given to patient to call to set up his 1 month follow-up with cardiothoracic surgery for repeat chest x-ray.  Patient states his symptoms are slowly improving        Immune and Lymphatic   Lymphadenopathy    Incidental noting of enlarged right axillary lymph node on imaging.  Patient also has level 3 bilateral lymph node enlargement per CT scan of neck unable to palpate these on exam.  Will consider ultrasound in 2 weeks to evaluate lymph node size        Other   Heartburn    Will do omeprazole 20 mg once a day for 30 days patient can use as needed famotidine      Lower abdominal pain    Unclear etiology.  Likely secondary to constipation abdominal 2 view done today pending result      Relevant Orders   DG Abd 2 Views   Hospital discharge follow-up    Reviewed hospital notes consult notes and imaging  along with lab work.       Return if symptoms worsen or fail to improve.    Audria Nine, NP

## 2022-06-10 ENCOUNTER — Other Ambulatory Visit (HOSPITAL_COMMUNITY): Payer: Self-pay

## 2022-06-10 ENCOUNTER — Telehealth: Payer: Self-pay | Admitting: Nurse Practitioner

## 2022-06-10 MED ORDER — AMPHETAMINE-DEXTROAMPHET ER 30 MG PO CP24
30.0000 mg | ORAL_CAPSULE | Freq: Every morning | ORAL | 0 refills | Status: AC
  Filled 2022-06-10: qty 30, 30d supply, fill #0

## 2022-06-10 NOTE — Telephone Encounter (Signed)
Patients fmla paperwork has been completed for the continuous leave for his hospitalization. I messaged him and told him that for the RA and psoriasis that he would need to be treated by specialist for intermittent leave if he is having that much trouble

## 2022-06-11 NOTE — Telephone Encounter (Signed)
Called and informed pt of this information. 

## 2022-06-17 ENCOUNTER — Other Ambulatory Visit (HOSPITAL_COMMUNITY): Payer: Self-pay

## 2022-06-17 ENCOUNTER — Telehealth: Payer: Self-pay | Admitting: Pulmonary Disease

## 2022-06-17 ENCOUNTER — Other Ambulatory Visit: Payer: Self-pay | Admitting: Pulmonary Disease

## 2022-06-17 NOTE — Telephone Encounter (Signed)
RX was sent in on 12/18/21. Called pharmacy to verify if he still had a refill on file. The refill expired yesterday so they will need a new prescription sent. Patient never picked up this prescription.   AO, please advise if you are ok with sending in a new prescription for him. Thanks!

## 2022-06-17 NOTE — Telephone Encounter (Signed)
PT going in for a in-lab sleep study tomorrow. Dr. Wynona Neat said he would RX some Ambian for him at his last visit.   Please use [facility].

## 2022-06-18 ENCOUNTER — Other Ambulatory Visit (HOSPITAL_COMMUNITY): Payer: Self-pay

## 2022-06-18 ENCOUNTER — Ambulatory Visit (HOSPITAL_BASED_OUTPATIENT_CLINIC_OR_DEPARTMENT_OTHER): Payer: Commercial Managed Care - PPO | Attending: Pulmonary Disease | Admitting: Pulmonary Disease

## 2022-06-18 ENCOUNTER — Other Ambulatory Visit: Payer: Self-pay

## 2022-06-18 DIAGNOSIS — G478 Other sleep disorders: Secondary | ICD-10-CM | POA: Insufficient documentation

## 2022-06-18 DIAGNOSIS — G4733 Obstructive sleep apnea (adult) (pediatric): Secondary | ICD-10-CM

## 2022-06-18 MED ORDER — ZOLPIDEM TARTRATE 10 MG PO TABS
10.0000 mg | ORAL_TABLET | Freq: Every evening | ORAL | 1 refills | Status: AC | PRN
  Filled 2022-06-18: qty 30, 30d supply, fill #0

## 2022-06-19 NOTE — Telephone Encounter (Signed)
Ambien refilled-06/18/22

## 2022-06-20 ENCOUNTER — Encounter: Payer: Self-pay | Admitting: Nurse Practitioner

## 2022-06-22 ENCOUNTER — Other Ambulatory Visit: Payer: Self-pay | Admitting: Nurse Practitioner

## 2022-06-22 DIAGNOSIS — R591 Generalized enlarged lymph nodes: Secondary | ICD-10-CM

## 2022-06-23 ENCOUNTER — Ambulatory Visit: Payer: Commercial Managed Care - PPO | Admitting: Nurse Practitioner

## 2022-06-24 NOTE — Telephone Encounter (Signed)
d 

## 2022-06-30 ENCOUNTER — Telehealth: Payer: Self-pay | Admitting: Pulmonary Disease

## 2022-06-30 DIAGNOSIS — G4733 Obstructive sleep apnea (adult) (pediatric): Secondary | ICD-10-CM | POA: Diagnosis not present

## 2022-06-30 NOTE — Telephone Encounter (Signed)
Call patient  Sleep study result  Date of study: 06/18/2022  Impression: Negative study for significant sleep disordered breathing Bruxism Snoring Upper airway resistance  Recommendation:  Referral to dentist to evaluate for bruxism and possible oral device for snoring and bruxism  Continue weight loss efforts  Clinical follow-up of symptoms  Follow-up as previously scheduled

## 2022-06-30 NOTE — Procedures (Signed)
POLYSOMNOGRAPHY  Last, First: Randee, [place] MRN: [id number] Gender: Male Age (years): 27 Weight (lbs): 193 DOB: 03-Jul-1995 BMI: 27 Primary Care: No PCP Epworth Score: 4 Referring: Tomma Lightning MD Technician: Ulyess Mort Interpreting: Tomma Lightning MD Study Type: NPSG Ordered Study Type: Split Night CPAP Study date: 06/18/2022 Location: [place] CLINICAL INFORMATION Karan Ramnauth is a 27 year old Male and was referred to the sleep center for evaluation of G47.33 OSA: Adult and Pediatric (327.23). Indications include Excessive Daytime Sleepiness, Fatigue, OSA.   Most recent polysomnogram dated 07/25/2017 revealed an AHI of 4.7/h. MEDICATIONS Patient self administered medications include: AMBIEN. Medications administered during study include Sleep medicine administered - Ambien 10 mg at 10:30:01 PM  SLEEP STUDY TECHNIQUE A multi-channel overnight Polysomnography study was performed. The channels recorded and monitored were central and occipital EEG, electrooculogram (EOG), submentalis EMG (chin), nasal and oral airflow, thoracic and abdominal wall motion, anterior tibialis EMG, snore microphone, electrocardiogram, and a pulse oximetry. TECHNICIAN COMMENTS Comments added by Technician: Patient was ordered as a split night study. Comments added by Scorer: N/A SLEEP ARCHITECTURE The study was initiated at 11:43:10 PM and terminated at 5:48:05 AM. The total recorded time was 364.9 minutes. EEG confirmed total sleep time was 336.5 minutes yielding a sleep efficiency of 92.2%. Sleep onset after lights out was 9.9 minutes with a REM latency of 156.0 minutes. The patient spent 8.6% of the night in stage N1 sleep, 85.0% in stage N2 sleep, 0.0% in stage N3 and 6.4% in REM. Wake after sleep onset (WASO) was 18.5 minutes. The Arousal Index was 19.8/hour. RESPIRATORY PARAMETERS There were a total of 8 respiratory disturbances out of which 0 were apneas ( 0  obstructive, 0 mixed, 0 central) and 8 hypopneas. The apnea/hypopnea index (AHI) was 1.4 events/hour. The central sleep apnea index was 0 events/hour. The REM AHI was 5.6 events/hour and NREM AHI was 1.1 events/hour. The supine AHI was 3.1 events/hour and the non supine AHI was 0.5 supine during 34.32% of sleep. Respiratory disturbances were associated with oxygen desaturation down to a nadir of 91.0% during sleep. The mean oxygen saturation during the study was 95.8%.  LEG MOVEMENT DATA The total leg movements were 0 with a resulting leg movement index of 0.0/hr .Associated arousal with leg movement index was 0.0/hr.  CARDIAC DATA The underlying cardiac rhythm was most consistent with sinus rhythm. Mean heart rate during sleep was 69.6 bpm. Additional rhythm abnormalities include None.  IMPRESSIONS - No Significant Obstructive Sleep apnea(OSA) - EKG showed no cardiac abnormalities. - No significant Oxygen Desaturation - The patient snored with moderate snoring volume. - No significant periodic leg movements(PLMs) during sleep. However, no significant associated arousals.  DIAGNOSIS - Upper Airway Resistance Syndrome (G47.8) - Bruxism (G47.63)  RECOMMENDATIONS - Consider oral bite guard for bruxism, referral to dentist. - Avoid alcohol, sedatives and other CNS depressants that may worsen sleep apnea and disrupt normal sleep architecture. - Sleep hygiene should be reviewed to assess factors that may improve sleep quality. - Weight management and regular exercise should be initiated or continued.  [Electronically signed] 06/30/2022 07:38 AM  Virl Diamond MD NPI: [id number]

## 2022-06-30 NOTE — Telephone Encounter (Signed)
ATC x1 LVM foir patient to call our office back regarding sleep study.

## 2022-07-01 ENCOUNTER — Other Ambulatory Visit: Payer: Commercial Managed Care - PPO

## 2022-07-03 ENCOUNTER — Ambulatory Visit: Payer: Commercial Managed Care - PPO | Admitting: Nurse Practitioner

## 2022-07-04 ENCOUNTER — Encounter: Payer: Self-pay | Admitting: Nurse Practitioner

## 2022-07-04 NOTE — Telephone Encounter (Signed)
error 

## 2022-07-07 ENCOUNTER — Other Ambulatory Visit: Payer: Self-pay | Admitting: Nurse Practitioner

## 2022-07-07 DIAGNOSIS — R591 Generalized enlarged lymph nodes: Secondary | ICD-10-CM

## 2022-07-08 ENCOUNTER — Other Ambulatory Visit: Payer: Commercial Managed Care - PPO

## 2022-07-14 ENCOUNTER — Ambulatory Visit
Admission: RE | Admit: 2022-07-14 | Discharge: 2022-07-14 | Disposition: A | Discharge status: Home / Self Care | Payer: Commercial Managed Care - PPO | Source: Ambulatory Visit | Attending: Nurse Practitioner | Admitting: Nurse Practitioner

## 2022-07-14 ENCOUNTER — Other Ambulatory Visit: Payer: Self-pay | Admitting: Nurse Practitioner

## 2022-07-14 DIAGNOSIS — R591 Generalized enlarged lymph nodes: Secondary | ICD-10-CM

## 2022-07-15 ENCOUNTER — Other Ambulatory Visit: Payer: Self-pay | Admitting: Nurse Practitioner

## 2022-07-15 DIAGNOSIS — R591 Generalized enlarged lymph nodes: Secondary | ICD-10-CM

## 2022-07-22 ENCOUNTER — Other Ambulatory Visit: Payer: Commercial Managed Care - PPO

## 2022-07-23 ENCOUNTER — Ambulatory Visit
Admission: RE | Admit: 2022-07-23 | Discharge: 2022-07-23 | Disposition: A | Discharge status: Home / Self Care | Payer: Commercial Managed Care - PPO | Source: Ambulatory Visit | Attending: Nurse Practitioner | Admitting: Nurse Practitioner

## 2022-07-23 DIAGNOSIS — R591 Generalized enlarged lymph nodes: Secondary | ICD-10-CM

## 2022-08-01 ENCOUNTER — Other Ambulatory Visit: Payer: Self-pay

## 2022-08-01 ENCOUNTER — Other Ambulatory Visit (HOSPITAL_COMMUNITY): Payer: Self-pay

## 2022-08-01 ENCOUNTER — Other Ambulatory Visit: Payer: Self-pay | Admitting: Nurse Practitioner

## 2022-08-01 MED ORDER — AMPHETAMINE-DEXTROAMPHET ER 30 MG PO CP24
30.0000 mg | ORAL_CAPSULE | Freq: Every day | ORAL | 0 refills | Status: AC
  Filled 2022-08-01: qty 30, 30d supply, fill #0

## 2022-08-01 MED ORDER — OMEPRAZOLE 20 MG PO CPDR
20.0000 mg | DELAYED_RELEASE_CAPSULE | Freq: Every day | ORAL | 0 refills | Status: AC
  Filled 2022-08-01 (×2): qty 30, 30d supply, fill #0

## 2022-08-02 ENCOUNTER — Emergency Department (HOSPITAL_BASED_OUTPATIENT_CLINIC_OR_DEPARTMENT_OTHER): Payer: Commercial Managed Care - PPO

## 2022-08-02 ENCOUNTER — Emergency Department (HOSPITAL_BASED_OUTPATIENT_CLINIC_OR_DEPARTMENT_OTHER)
Admission: EM | Admit: 2022-08-02 | Discharge: 2022-08-02 | Disposition: A | Discharge status: Home / Self Care | Payer: Commercial Managed Care - PPO | Attending: Emergency Medicine | Admitting: Emergency Medicine

## 2022-08-02 ENCOUNTER — Encounter (HOSPITAL_BASED_OUTPATIENT_CLINIC_OR_DEPARTMENT_OTHER): Payer: Self-pay

## 2022-08-02 ENCOUNTER — Other Ambulatory Visit: Payer: Self-pay

## 2022-08-02 DIAGNOSIS — J040 Acute laryngitis: Secondary | ICD-10-CM | POA: Diagnosis not present

## 2022-08-02 DIAGNOSIS — J45909 Unspecified asthma, uncomplicated: Secondary | ICD-10-CM | POA: Diagnosis not present

## 2022-08-02 DIAGNOSIS — Z91010 Allergy to peanuts: Secondary | ICD-10-CM | POA: Insufficient documentation

## 2022-08-02 DIAGNOSIS — Z20822 Contact with and (suspected) exposure to covid-19: Secondary | ICD-10-CM | POA: Insufficient documentation

## 2022-08-02 DIAGNOSIS — J029 Acute pharyngitis, unspecified: Secondary | ICD-10-CM | POA: Diagnosis present

## 2022-08-02 HISTORY — DX: Interstitial emphysema: J98.2

## 2022-08-02 LAB — RESP PANEL BY RT-PCR (RSV, FLU A&B, COVID)  RVPGX2
Influenza A by PCR: NEGATIVE
Influenza B by PCR: NEGATIVE
Resp Syncytial Virus by PCR: NEGATIVE
SARS Coronavirus 2 by RT PCR: NEGATIVE

## 2022-08-02 LAB — GROUP A STREP BY PCR: Group A Strep by PCR: NOT DETECTED

## 2022-08-02 MED ORDER — DEXAMETHASONE SODIUM PHOSPHATE 10 MG/ML IJ SOLN
10.0000 mg | Freq: Once | INTRAMUSCULAR | Status: AC
  Administered 2022-08-02: 10 mg via INTRAMUSCULAR
  Filled 2022-08-02: qty 1

## 2022-08-02 NOTE — ED Notes (Signed)
Reviewed AVS with patient, patient expressed understanding of directions, denies further questions at this time. 

## 2022-08-02 NOTE — Discharge Instructions (Signed)
You were seen today for sore throat and hoarse voice.  You likely have laryngitis.  Your COVID, influenza, strep testing were all negative.  Continue measures at home including allergy medications and Tylenol or Motrin for pain.

## 2022-08-02 NOTE — ED Triage Notes (Signed)
Pt reports sore throat onset Monday, today developed dry cough, difficult to take a deep breath, concerned due to recent admission for pneumomediastinum. Has been taking OTC medications with no relief.

## 2022-08-02 NOTE — ED Provider Notes (Signed)
[address] EMERGENCY DEPARTMENT AT [facility] Provider Note   CSN: [id number] Arrival date & time: 08/02/22  0423     History  Chief Complaint  Patient presents with   Sore Throat    Nicos Nicley is a 27 y.o. male.  HPI     This is a 27 year old male who presents with sore throat, dry cough.  Patient reports onset of symptoms on Tuesday.  Initially thought it was postnasal drip or allergies.  He doubled up on some allergy medications.  He has progressive scratchy throat and hoarse voice.  He has also developed a dry cough.  He states he had difficulty breathing at night when he laid down.  Has not had any recent fevers.  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  Recent admission for pneumomediastinum.  Home Medications Prior to Admission medications   Medication Sig Start Date End Date Taking? Authorizing Provider  amphetamine-dextroamphetamine (ADDERALL XR) 20 MG 24 hr capsule Take 1 capsule (20 mg total) by mouth daily. 05/12/22     amphetamine-dextroamphetamine (ADDERALL XR) 30 MG 24 hr capsule Take 1 capsule (30 mg total) by mouth every morning. 06/10/22     amphetamine-dextroamphetamine (ADDERALL XR) 30 MG 24 hr capsule take one capsule by mouth daily in the morning 08/01/22     buPROPion (WELLBUTRIN XL) 150 MG 24 hr tablet Take 1 tablet (150 mg total) by mouth daily. Patient not taking: Reported on 05/30/2022 02/04/22   Eden Emms, NP  fexofenadine (ALLEGRA) 180 MG tablet Take 180 mg by mouth daily.    [provider]  ketoconazole (NIZORAL) 2 % cream Apply 1 Application topically daily. For two weeks 01/08/22   Eden Emms, NP  lidocaine (XYLOCAINE) 5 % ointment Apply 1 Application topically 4 (four) times daily as needed. Patient not taking: Reported on 05/30/2022 02/10/22   Radford Pax, NP  naproxen (NAPROSYN) 500 MG tablet Take 1 tablet (500 mg total) by mouth 2 (two) times daily with a meal. Patient not taking:  Reported on 05/30/2022 02/13/22   Wallis Bamberg, PA-C  omeprazole (PRILOSEC) 20 MG capsule Take 1 capsule (20 mg total) by mouth daily. 08/01/22   Eden Emms, NP  ondansetron (ZOFRAN-ODT) 4 MG disintegrating tablet Take 1 tablet (4 mg total) by mouth every 8 (eight) hours as needed for nausea or vomiting. 05/15/22   Eden Emms, NP  senna-docusate (SENOKOT-S) 8.6-50 MG tablet Take 1 tablet by mouth at bedtime as needed for mild constipation or moderate constipation. 09/20/21   Long, Arlyss Repress, MD  triamcinolone cream (KENALOG) 0.1 % Apply 1 Application topically 2 (two) times daily. 05/15/22   Eden Emms, NP  zolpidem (AMBIEN) 10 MG tablet Take 1 tablet (10 mg total) by mouth at bedtime as needed for sleep. 06/18/22   Tomma Lightning, MD      Allergies    Methotrexate derivatives, Peanut oil, Robaxin [methocarbamol], Egg white (egg protein), Norco [hydrocodone-acetaminophen], and Plaquenil [hydroxychloroquine]    Review of Systems   Review of Systems  Constitutional:  Negative for fever.  HENT:  Positive for sore throat and voice change.   Respiratory:  Positive for cough and shortness of breath.   All other systems reviewed and are negative.   Physical Exam Updated Vital Signs BP (!) 114/57   Pulse (!) 111   Temp 98.2 F (36.8 C) (Oral)   Resp 16   Ht 1.803 m (5\' 11" )   Wt 87.5  kg   SpO2 99%   BMI 26.90 kg/m  Physical Exam Vitals and nursing note reviewed.  Constitutional:      Appearance: He is well-developed.  HENT:     Head: Normocephalic and atraumatic.     Nose: Congestion present.     Mouth/Throat:     Comments: Posterior oropharynx clear, no tonsillar exudate, slight bilateral tonsillar swelling, uvula midline Eyes:     Pupils: Pupils are equal, round, and reactive to light.  Cardiovascular:     Rate and Rhythm: Normal rate and regular rhythm.     Heart sounds: Normal heart sounds.  Pulmonary:     Effort: Pulmonary effort is normal. No respiratory distress.      Breath sounds: Normal breath sounds. No wheezing.  Abdominal:     General: Bowel sounds are normal.     Palpations: Abdomen is soft.     Tenderness: There is no abdominal tenderness. There is no rebound.  Musculoskeletal:     Cervical back: Neck supple.  Lymphadenopathy:     Cervical: No cervical adenopathy.  Skin:    General: Skin is warm and dry.  Neurological:     Mental Status: He is alert and oriented to person, place, and time.  Psychiatric:        Mood and Affect: Mood normal.     ED Results / Procedures / Treatments   Labs (all labs ordered are listed, but only abnormal results are displayed) Labs Reviewed  RESP PANEL BY RT-PCR (RSV, FLU A&B, COVID)  [id number]  GROUP A STREP BY PCR    EKG None  Radiology DG Chest Portable 1 View  Result Date: 08/02/2022 CLINICAL DATA:  27 year old male with history of cough. History of pneumomediastinum. EXAM: PORTABLE CHEST 1 VIEW COMPARISON:  Chest x-ray 05/02/2022. FINDINGS: Lung volumes are normal. No consolidative airspace disease. No pleural effusions. No pneumothorax. No pulmonary nodule or mass noted. Pulmonary vasculature and the cardiomediastinal silhouette are within normal limits. IMPRESSION: No radiographic evidence of acute cardiopulmonary disease. Electronically Signed   By: Trudie Reed M.D.   On: 08/02/2022 05:49    Procedures Procedures    Medications Ordered in ED Medications  dexamethasone (DECADRON) injection 10 mg (10 mg Intramuscular Given 08/02/22 0506)    ED Course/ Medical Decision Making/ A&P                             Medical Decision Making Amount and/or Complexity of Data Reviewed Radiology: ordered.  Risk Prescription drug management.   This patient presents to the ED for concern of sore throat, hoarse voice, this involves an extensive number of treatment options, and is a complaint that carries with it a high risk of complications and morbidity.  I considered the following differential  and admission for this acute, potentially life threatening condition.  The differential diagnosis includes viral illness such as COVID or influenza, strep, laryngitis  MDM:    This is a 27 year old male who presents with initially sore throat.  Did develop dry cough and is worried given his history of pneumomediastinum.  He is nontoxic and vital signs are reassuring.  He has been afebrile.  COVID and influenza testing sent and are negative.  Strep testing is negative.  Chest x-ray does not show any evidence of pneumonia or pneumothorax mediastinum.  Overall reassuring.  He does have some bilateral tonsillar swelling.  For this reason, Decadron was given.  Suspect viral laryngitis or other  viral etiology.  We discussed supportive measures.  Low suspicion for deep space infection.  (Labs, imaging, consults)  Labs: I Ordered, and personally interpreted labs.  The pertinent results include: COVID, flu, strep testing  Imaging Studies ordered: I ordered imaging studies including none I independently visualized and interpreted imaging. I agree with the radiologist interpretation  Additional history obtained from chart review.  External records from outside source obtained and reviewed including prior evaluations  Cardiac Monitoring: The patient was maintained on a cardiac monitor.  If on the cardiac monitor, I personally viewed and interpreted the cardiac monitored which showed an underlying rhythm of: Sinus rhythm  Reevaluation: After the interventions noted above, I reevaluated the patient and found that they have :improved  Social Determinants of Health:  lives independently  Disposition: Discharge  Co morbidities that complicate the patient evaluation  Past Medical History:  Diagnosis Date   Allergy    Anxiety    Asthma    Pilonidal cyst    Pneumomediastinum (HCC)    Recurrent upper respiratory infection (URI)      Medicines Meds ordered this encounter  Medications    dexamethasone (DECADRON) injection 10 mg    I have reviewed the patients home medicines and have made adjustments as needed  Problem List / ED Course: Problem List Items Addressed This Visit   None Visit Diagnoses     Laryngitis    -  Primary                   Final Clinical Impression(s) / ED Diagnoses Final diagnoses:  Laryngitis    Rx / DC Orders ED Discharge Orders     None         Shon Baton, MD 08/02/22 [phone]

## 2022-08-04 ENCOUNTER — Telehealth: Payer: Self-pay

## 2022-08-04 NOTE — Transitions of Care (Post Inpatient/ED Visit) (Unsigned)
   08/04/2022  Name: Amario Longmore MRN: [id number] DOB: 1995/10/16  Today's TOC FU Call Status: Today's TOC FU Call Status:: Unsuccessul Call (1st Attempt) Unsuccessful Call (1st Attempt) Date: 08/04/22  Attempted to reach the patient regarding the most recent Inpatient/ED visit.  Follow Up Plan: Additional outreach attempts will be made to reach the patient to complete the Transitions of Care (Post Inpatient/ED visit) call.   Signature   Woodfin Ganja LPN [facility] Nurse Health Advisor Direct Dial [phone]

## 2022-08-05 ENCOUNTER — Encounter: Payer: Self-pay | Admitting: Nurse Practitioner

## 2022-08-05 ENCOUNTER — Ambulatory Visit (INDEPENDENT_AMBULATORY_CARE_PROVIDER_SITE_OTHER): Payer: Commercial Managed Care - PPO | Admitting: Nurse Practitioner

## 2022-08-05 VITALS — BP 132/70 | HR 108 | Temp 98.7°F | Ht 71.0 in | Wt 188.0 lb

## 2022-08-05 DIAGNOSIS — R6882 Decreased libido: Secondary | ICD-10-CM | POA: Insufficient documentation

## 2022-08-05 DIAGNOSIS — L659 Nonscarring hair loss, unspecified: Secondary | ICD-10-CM | POA: Diagnosis not present

## 2022-08-05 DIAGNOSIS — R5383 Other fatigue: Secondary | ICD-10-CM | POA: Diagnosis not present

## 2022-08-05 DIAGNOSIS — J029 Acute pharyngitis, unspecified: Secondary | ICD-10-CM | POA: Diagnosis not present

## 2022-08-05 DIAGNOSIS — Z09 Encounter for follow-up examination after completed treatment for conditions other than malignant neoplasm: Secondary | ICD-10-CM

## 2022-08-05 LAB — TESTOSTERONE: Testosterone: 307.52 ng/dL (ref 300.00–890.00)

## 2022-08-05 NOTE — Assessment & Plan Note (Signed)
Patient states he is dealing with some hearing loss.  Has been going on for some time previously but has not been any relation to his testosterone patient has lost a considerable amount of weight but doing this with diet and exercise per his report pending testosterone today

## 2022-08-05 NOTE — Assessment & Plan Note (Signed)
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

## 2022-08-05 NOTE — Assessment & Plan Note (Signed)
Did review patient's recent ED visit inclusive of imaging, lab tests, provider note

## 2022-08-05 NOTE — Assessment & Plan Note (Signed)
Has improved some but still present continues to go over-the-counter analgesics as needed and warm salt water gargles.  Also stay hydrated

## 2022-08-05 NOTE — Progress Notes (Signed)
Established Patient Office Visit  Subjective   Patient ID: Michael Lam, male    DOB: 06/11/1995  Age: 27 y.o. MRN: [id number]  Chief Complaint  Patient presents with   Laryngitis    Hospital follow up for laryngitis. Pt states he feels okay but it hard for him to speak. Does not feel pain. Hard to breath.    HPI  Hospital follow up: Patient was seen in the emergency department on 08/02/2022 with chief complaint of sore throat.  States his symptoms started Tuesday prior but was postnasal drip or allergies so he increased his antihistamines.  Continue to progress he developed a dry cough having difficulty breathing at night when he laid down no fevers.  Patient is working emergency department and had a recent admission for pneumomediastinum. Patient was given dexamethasone some 10 mg IM.  Patient was given a chest x-ray that came back normal. Patient is here for follow-up.  States that when he left that morning he was feeling ok. States that on Sunday he felt it return and got worse. State that he was hoarse. Today he feels better.  States that he is taking ibuprofen and his allergy medicine.    Review of Systems  Constitutional:  Positive for malaise/fatigue. Negative for chills and fever.  HENT:  Positive for sore throat.   Respiratory:  Negative for shortness of breath.   Cardiovascular:  Negative for chest pain.  Neurological:  Negative for headaches.      Objective:     BP 132/70   Pulse (!) 108   Temp 98.7 F (37.1 C) (Temporal)   Ht 5\' 11"  (1.803 m)   Wt 188 lb (85.3 kg)   SpO2 97%   BMI 26.22 kg/m  BP Readings from Last 3 Encounters:  08/05/22 132/70  08/02/22 114/71  06/06/22 118/66   Wt Readings from Last 3 Encounters:  08/05/22 188 lb (85.3 kg)  08/02/22 192 lb 14.4 oz (87.5 kg)  06/18/22 193 lb (87.5 kg)      Physical Exam Vitals and nursing note reviewed.  Constitutional:      Appearance: Normal appearance.  HENT:     Right Ear:  Tympanic membrane, ear canal and external ear normal.     Left Ear: Tympanic membrane, ear canal and external ear normal.     Mouth/Throat:     Mouth: Mucous membranes are moist.     Pharynx: Oropharynx is clear.  Cardiovascular:     Rate and Rhythm: Normal rate and regular rhythm.     Heart sounds: Normal heart sounds.  Pulmonary:     Effort: Pulmonary effort is normal.     Breath sounds: Normal breath sounds.  Lymphadenopathy:     Cervical: No cervical adenopathy.  Neurological:     Mental Status: He is alert.      No results found for any visits on 08/05/22.    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  Also stay hydrated      Hair loss    Patient states he is dealing with some hearing loss.  Has been going on for some time previously but has not been any relation to his testosterone patient has lost a considerable amount of weight but doing this with diet and exercise per his report pending testosterone today      Relevant Orders   Testosterone   Decreased libido    Pending testosterone level today      Relevant Orders   Testosterone    Return if symptoms worsen or fail to improve.    Audria Nine, NP

## 2022-08-05 NOTE — Assessment & Plan Note (Signed)
Pending testosterone level today

## 2022-08-05 NOTE — Patient Instructions (Signed)
Nice to see you today I will be in touch with the labs once I have reviewed them If you do not continue to improve let me know via mychart

## 2022-08-06 ENCOUNTER — Other Ambulatory Visit (HOSPITAL_COMMUNITY): Payer: Self-pay

## 2022-08-06 ENCOUNTER — Encounter: Payer: Self-pay | Admitting: Nurse Practitioner

## 2022-08-06 DIAGNOSIS — J069 Acute upper respiratory infection, unspecified: Secondary | ICD-10-CM

## 2022-08-06 MED ORDER — AZITHROMYCIN 250 MG PO TABS
ORAL_TABLET | ORAL | 0 refills | Status: AC
Start: 2022-08-06 — End: 2022-08-12
  Filled 2022-08-06: qty 6, 5d supply, fill #0

## 2022-08-07 ENCOUNTER — Encounter: Payer: Self-pay | Admitting: Nurse Practitioner

## 2022-08-08 NOTE — Telephone Encounter (Signed)
Patient was sent a mychart message with result's .  Nothing else further needed.

## 2022-09-10 ENCOUNTER — Ambulatory Visit: Payer: Commercial Managed Care - PPO | Admitting: Pulmonary Disease

## 2022-09-10 ENCOUNTER — Ambulatory Visit: Payer: Commercial Managed Care - PPO | Admitting: Nurse Practitioner

## 2022-09-16 ENCOUNTER — Ambulatory Visit (INDEPENDENT_AMBULATORY_CARE_PROVIDER_SITE_OTHER): Payer: Commercial Managed Care - PPO | Admitting: Nurse Practitioner

## 2022-09-16 ENCOUNTER — Encounter: Payer: Self-pay | Admitting: Nurse Practitioner

## 2022-09-16 VITALS — BP 122/70 | HR 96 | Temp 97.8°F | Ht 71.0 in | Wt 172.2 lb

## 2022-09-16 DIAGNOSIS — G44209 Tension-type headache, unspecified, not intractable: Secondary | ICD-10-CM

## 2022-09-16 DIAGNOSIS — G43709 Chronic migraine without aura, not intractable, without status migrainosus: Secondary | ICD-10-CM | POA: Diagnosis not present

## 2022-09-16 MED ORDER — CYCLOBENZAPRINE HCL 5 MG PO TABS: 5.0000 mg | ORAL_TABLET | Freq: Two times a day (BID) | ORAL | 0 refills | Status: AC | PRN

## 2022-09-16 NOTE — Assessment & Plan Note (Signed)
History of the same.  Patient does not remember being on any preventative or abortive therapy in the past.

## 2022-09-16 NOTE — Progress Notes (Signed)
Acute Office Visit  Subjective:     Patient ID: Michael Lam, male    DOB: [birth year], 28 y.o.   MRN: [id number]  Chief Complaint  Patient presents with   Migraine    Pt complains in the past couple of months the migraines have gotten worse. Pt states OTC Excedrin is not helping. Pt states migraine comes with nausea     Patient is in today for Headaches with a history of migraines, osa, asthma, RA, psoriasis  Hx of migraines was seen by neurology in the past Dr. Lucia Gaskins and was on nortriptyline  States that it is once a week. States that it is the front and goes around the head and down the neck. States that his vision will blur after the headache starts States that he can tell when he has a headche he can tell when it is going to be a migrinaes. States that tylenon and ibuprofen. Excedrin helps but does not abort. Light and sound sensitivity with nausea. States that it is described as a tihgt rubber band and down the neck and throbbiing. States some tingling down both arms sometimes.   Patient mentions that he has had to miss work due to the headaches being so bad and they are requesting intermittent leave paperwork filled out. Did discuss with the patient and 2 days a months should suffice  Review of Systems  Constitutional:  Negative for chills and fever.  Respiratory:  Negative for shortness of breath.   Cardiovascular:  Negative for chest pain.  Neurological:  Positive for headaches.        Objective:    BP 122/70   Pulse 96   Temp 97.8 F (36.6 C) (Temporal)   Ht 5\' 11"  (1.803 m)   Wt 172 lb 3.2 oz (78.1 kg)   SpO2 96%   BMI 24.02 kg/m  BP Readings from Last 3 Encounters:  09/16/22 122/70  08/05/22 132/70  08/02/22 114/71   Wt Readings from Last 3 Encounters:  09/16/22 172 lb 3.2 oz (78.1 kg)  08/05/22 188 lb (85.3 kg)  08/02/22 192 lb 14.4 oz (87.5 kg)      Physical Exam Vitals and nursing note reviewed.  Constitutional:      Appearance:  Normal appearance.  HENT:     Right Ear: Tympanic membrane, ear canal and external ear normal.     Left Ear: Tympanic membrane, ear canal and external ear normal.     Mouth/Throat:     Mouth: Mucous membranes are moist.     Pharynx: Oropharynx is clear.  Eyes:     Extraocular Movements: Extraocular movements intact.     Pupils: Pupils are equal, round, and reactive to light.  Cardiovascular:     Rate and Rhythm: Normal rate and regular rhythm.     Heart sounds: Normal heart sounds.  Pulmonary:     Effort: Pulmonary effort is normal.     Breath sounds: Normal breath sounds.  Musculoskeletal:       Arms:     Comments: Full cervical range of motion without aggravating factors  Patient have lower occipital tenderness on palpation  Lymphadenopathy:     Cervical: No cervical adenopathy.  Neurological:     General: No focal deficit present.     Mental Status: He is alert.     Deep Tendon Reflexes:     Reflex Scores:      Bicep reflexes are 2+ on the right side and 2+ on the left side.  Patellar reflexes are 2+ on the right side and 2+ on the left side.    Comments: Bilateral upper and lower extremity strength 5/5     No results found for any visits on 09/16/22.      Assessment & Plan:   Problem List Items Addressed This Visit       Cardiovascular and Mediastinum   Chronic migraine without aura without status migrainosus, not intractable - Primary    History of the same.  Patient does not remember being on any preventative or abortive therapy in the past.      Relevant Medications   cyclobenzaprine (FLEXERIL) 5 MG tablet     Other   Tension headache    Sounds tension headache in nature.  Will prescribe Flexeril 5 mg twice daily as needed.  Sedation precautions reviewed.  This is ineffective consider a triptan or Fioricet.      Relevant Medications   cyclobenzaprine (FLEXERIL) 5 MG tablet    Meds ordered this encounter  Medications   cyclobenzaprine  (FLEXERIL) 5 MG tablet    Sig: Take 1 tablet (5 mg total) by mouth 2 (two) times daily as needed.    Dispense:  20 tablet    Refill:  0    Order Specific Question:   Supervising Provider    Answer:   Roxy Manns A [[id number]]    Return in about 4 months (around 01/16/2023) for CPE and Labs.  Audria Nine, NP

## 2022-09-16 NOTE — Patient Instructions (Signed)
Nice to see you today I have sent the muscle relaxer into the pharmacy They can cause sedation so use caution  Follow up 4 months for you physical, sooner if you need me

## 2022-09-16 NOTE — Assessment & Plan Note (Signed)
Sounds tension headache in nature.  Will prescribe Flexeril 5 mg twice daily as needed.  Sedation precautions reviewed.  This is ineffective consider a triptan or Fioricet.

## 2022-09-18 ENCOUNTER — Encounter: Payer: Self-pay | Admitting: Nurse Practitioner

## 2022-09-25 ENCOUNTER — Other Ambulatory Visit: Payer: Self-pay | Admitting: Nurse Practitioner

## 2022-09-25 DIAGNOSIS — R590 Localized enlarged lymph nodes: Secondary | ICD-10-CM

## 2022-10-24 ENCOUNTER — Other Ambulatory Visit: Payer: Self-pay | Admitting: Nurse Practitioner

## 2022-10-24 ENCOUNTER — Ambulatory Visit
Admission: RE | Admit: 2022-10-24 | Discharge: 2022-10-24 | Disposition: A | Discharge status: Home / Self Care | Payer: Commercial Managed Care - PPO | Source: Ambulatory Visit | Attending: Nurse Practitioner

## 2022-10-24 ENCOUNTER — Other Ambulatory Visit (HOSPITAL_COMMUNITY): Payer: Self-pay

## 2022-10-24 DIAGNOSIS — R590 Localized enlarged lymph nodes: Secondary | ICD-10-CM

## 2022-10-24 DIAGNOSIS — R110 Nausea: Secondary | ICD-10-CM

## 2022-10-24 MED ORDER — AMPHETAMINE-DEXTROAMPHET ER 30 MG PO CP24
30.0000 mg | ORAL_CAPSULE | ORAL | 0 refills | Status: AC
Start: 2022-10-24 — End: ?
  Filled 2022-10-24: qty 20, 20d supply, fill #0
  Filled 2022-10-24: qty 10, 10d supply, fill #0

## 2022-10-27 ENCOUNTER — Other Ambulatory Visit (HOSPITAL_COMMUNITY): Payer: Self-pay

## 2022-10-27 MED ORDER — ONDANSETRON 4 MG PO TBDP
4.0000 mg | ORAL_TABLET | Freq: Three times a day (TID) | ORAL | 1 refills | Status: AC | PRN
Start: 2022-10-27 — End: ?
  Filled 2022-10-27: qty 20, 7d supply, fill #0

## 2022-10-31 ENCOUNTER — Encounter: Payer: Self-pay | Admitting: Nurse Practitioner

## 2022-10-31 NOTE — Telephone Encounter (Signed)
I have printed form and placed in your box for review

## 2022-11-05 ENCOUNTER — Other Ambulatory Visit (HOSPITAL_COMMUNITY): Payer: Self-pay

## 2022-11-06 ENCOUNTER — Other Ambulatory Visit (HOSPITAL_COMMUNITY): Payer: Self-pay

## 2023-05-25 NOTE — Telephone Encounter (Signed)
 Negative. Not accepting new patients.

## 2024-02-17 ENCOUNTER — Other Ambulatory Visit: Payer: Self-pay

## 2024-02-17 ENCOUNTER — Emergency Department (HOSPITAL_BASED_OUTPATIENT_CLINIC_OR_DEPARTMENT_OTHER)
Admission: EM | Admit: 2024-02-17 | Discharge: 2024-02-17 | Disposition: A | Discharge status: Home / Self Care | Attending: Emergency Medicine | Admitting: Emergency Medicine

## 2024-02-17 ENCOUNTER — Encounter (HOSPITAL_BASED_OUTPATIENT_CLINIC_OR_DEPARTMENT_OTHER): Payer: Self-pay

## 2024-02-17 DIAGNOSIS — J101 Influenza due to other identified influenza virus with other respiratory manifestations: Secondary | ICD-10-CM | POA: Diagnosis not present

## 2024-02-17 DIAGNOSIS — Z91010 Allergy to peanuts: Secondary | ICD-10-CM | POA: Diagnosis not present

## 2024-02-17 DIAGNOSIS — G43919 Migraine, unspecified, intractable, without status migrainosus: Secondary | ICD-10-CM | POA: Diagnosis not present

## 2024-02-17 DIAGNOSIS — R110 Nausea: Secondary | ICD-10-CM | POA: Diagnosis present

## 2024-02-17 LAB — BASIC METABOLIC PANEL WITH GFR
Anion gap: 13 (ref 5–15)
BUN: 16 mg/dL (ref 6–20)
CO2: 27 mmol/L (ref 22–32)
Calcium: 9.8 mg/dL (ref 8.9–10.3)
Chloride: 101 mmol/L (ref 98–111)
Creatinine, Ser: 0.98 mg/dL (ref 0.61–1.24)
GFR, Estimated: >60 mL/min
Glucose, Bld: 73 mg/dL (ref 70–99)
Potassium: 4 mmol/L (ref 3.5–5.1)
Sodium: 141 mmol/L (ref 135–145)

## 2024-02-17 LAB — CBC
HCT: 47.1 % (ref 39.0–52.0)
Hemoglobin: 15.9 g/dL (ref 13.0–17.0)
MCH: 27.6 pg (ref 26.0–34.0)
MCHC: 33.8 g/dL (ref 30.0–36.0)
MCV: 81.8 fL (ref 80.0–100.0)
Platelets: 219 10*3/uL (ref 150–400)
RBC: 5.76 MIL/uL (ref 4.22–5.81)
RDW: 12.4 % (ref 11.5–15.5)
WBC: 7.8 10*3/uL (ref 4.0–10.5)
nRBC: 0 % (ref 0.0–0.2)

## 2024-02-17 LAB — RESP PANEL BY RT-PCR (RSV, FLU A&B, COVID)  RVPGX2
Influenza A by PCR: POSITIVE — AB
Influenza B by PCR: NEGATIVE
Resp Syncytial Virus by PCR: NEGATIVE
SARS Coronavirus 2 by RT PCR: NEGATIVE

## 2024-02-17 MED ORDER — SODIUM CHLORIDE 0.9 % IV BOLUS
1000.0000 mL | Freq: Once | INTRAVENOUS | Status: AC
  Administered 2024-02-17: 1000 mL via INTRAVENOUS

## 2024-02-17 MED ORDER — KETOROLAC TROMETHAMINE 15 MG/ML IJ SOLN
15.0000 mg | Freq: Once | INTRAMUSCULAR | Status: AC
  Administered 2024-02-17: 15 mg via INTRAVENOUS
  Filled 2024-02-17: qty 1

## 2024-02-17 MED ORDER — METOCLOPRAMIDE HCL 5 MG/ML IJ SOLN
10.0000 mg | Freq: Once | INTRAMUSCULAR | Status: AC
  Administered 2024-02-17: 10 mg via INTRAVENOUS
  Filled 2024-02-17: qty 2

## 2024-02-17 NOTE — ED Triage Notes (Signed)
 Pt reports migraine x2 weeks. Pt reports taking medicine form migraines and it is not helping.

## 2024-02-17 NOTE — Discharge Instructions (Addendum)
 Follow up with primary care provider for ongoing management of migraines. I have also included local neurology office information and you may schedule an appointment with them for further migraine evaluation as well.  You also tested positive for influenza A today.  Read the instructions below on reasons to return to the emergency department and to learn more about your diagnosis.  Use over the counter medications for symptomatic relief as we discussed (musinex as a decongestant, Tylenol  for fever/pain, Motrin /Ibuprofen  for muscle aches). Followup with your primary care doctor in 4 days if your symptoms persist.  You're more than welcome to return to the emergency department if symptoms worsen or become concerning.  Upper Respiratory Infection, Adult  An upper respiratory infection (URI) is also sometimes known as the common cold. Most people improve within 1 week, but symptoms can last up to 2 weeks. A residual cough may last even longer.   URI is most commonly caused by a virus. Viruses are NOT treated with antibiotics. You can easily spread the virus to others by oral contact. This includes kissing, sharing a glass, coughing, or sneezing. Touching your mouth or nose and then touching a surface, which is then touched by another person, can also spread the virus.   TREATMENT  Treatment is directed at relieving symptoms. There is no cure. Antibiotics are not effective, because the infection is caused by a virus, not by bacteria. Treatment may include:  Increased fluid intake. Sports drinks offer valuable electrolytes, sugars, and fluids.  Breathing heated mist or steam (vaporizer or shower).  Eating chicken soup or other clear broths, and maintaining good nutrition.  Getting plenty of rest.  Using gargles or lozenges for comfort.  Controlling fevers with ibuprofen  or acetaminophen  as directed by your caregiver.  Increasing usage of your inhaler if you have asthma.  Return to work when your  temperature has returned to normal.   SEEK MEDICAL CARE IF:  After the first few days, you feel you are getting worse rather than better.  You develop worsening shortness of breath, or brown or red sputum. These may be signs of pneumonia.  You develop yellow or brown nasal discharge or pain in the face, especially when you bend forward. These may be signs of sinusitis.  You develop a fever, swollen neck glands, pain with swallowing, or white areas in the back of your throat. These may be signs of strep throat.

## 2024-02-17 NOTE — ED Provider Notes (Signed)
 "  EMERGENCY DEPARTMENT AT [facility] Provider Note   CSN: [id number] Arrival date & time: 02/17/24  1856     Patient presents with: Migraine   Michael Lam is a 29 y.o. male.   Patient is here for evaluation of migraine x 2 weeks.  Headaches are accompanied by nausea, photophobia, decreased appetite.  No episodes of emesis. He reports intermittent episodes where it feels as though water is pouring on his head from rear of head to front of head which is not typical with previous migraines. Denies any recent cough, congestion, shortness of breath, chest pain, abdominal pain, or recorded fevers. He reports dizziness with standing earlier today and attributed this to decreased food intake. 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 Patient has a history of migraines managed by his PCP. He denies any recent falls or head injuries. Denies generalized or unilateral weakness. He reports elevated BP, systolic 150-160s at home which is abnormal for him as well as elevated HR 120-130s, also abnormal for him.   The history is provided by the patient.  Migraine The current episode started more than 1 week ago. Associated symptoms include headaches.       Prior to Admission medications  Medication Sig Start Date End Date Taking? Authorizing Provider  amphetamine -dextroamphetamine  (ADDERALL XR) 20 MG 24 hr capsule Take 1 capsule (20 mg total) by mouth daily. Patient not taking: Reported on 08/05/2022 05/12/22     amphetamine -dextroamphetamine  (ADDERALL XR) 30 MG 24 hr capsule Take 1 capsule (30 mg total) by mouth every morning. 06/10/22     amphetamine -dextroamphetamine  (ADDERALL XR) 30 MG 24 hr capsule take one capsule by mouth daily in the morning 08/01/22     amphetamine -dextroamphetamine  (ADDERALL XR) 30 MG 24 hr capsule Take 1 capsule (30 mg total) by mouth every morning. 10/24/22     buPROPion  (WELLBUTRIN  XL) 150 MG 24 hr tablet Take 1  tablet (150 mg total) by mouth daily. 02/04/22   Wendee Lynwood HERO, NP  cyclobenzaprine  (FLEXERIL ) 5 MG tablet Take 1 tablet (5 mg total) by mouth 2 (two) times daily as needed. 09/16/22   Wendee Lynwood HERO, NP  fexofenadine (ALLEGRA) 180 MG tablet Take 180 mg by mouth daily.    [provider]  ketoconazole  (NIZORAL ) 2 % cream Apply 1 Application topically daily. For two weeks 01/08/22   Wendee Lynwood HERO, NP  lidocaine  (XYLOCAINE ) 5 % ointment Apply 1 Application topically 4 (four) times daily as needed. 02/10/22   Mayer, Jodi R, NP  naproxen  (NAPROSYN ) 500 MG tablet Take 1 tablet (500 mg total) by mouth 2 (two) times daily with a meal. 02/13/22   Christopher Savannah, PA-C  omeprazole  (PRILOSEC) 20 MG capsule Take 1 capsule (20 mg total) by mouth daily. 08/01/22   Wendee Lynwood HERO, NP  ondansetron  (ZOFRAN -ODT) 4 MG disintegrating tablet Take 1 tablet (4 mg total) by mouth every 8 (eight) hours as needed for nausea or vomiting. 10/27/22   Wendee Lynwood HERO, NP  senna-docusate (SENOKOT-S) 8.6-50 MG tablet Take 1 tablet by mouth at bedtime as needed for mild constipation or moderate constipation. 09/20/21   Long, Joshua G, MD  triamcinolone  cream (KENALOG ) 0.1 % Apply 1 Application topically 2 (two) times daily. 05/15/22   Wendee Lynwood HERO, NP  zolpidem  (AMBIEN ) 10 MG tablet Take 1 tablet (10 mg total) by mouth at bedtime as needed for sleep. 06/18/22   Neda Jennet LABOR, MD    Allergies:  Methotrexate  and trimetrexate, Peanut oil, Robaxin  [methocarbamol ], Egg protein (egg white), Norco [hydrocodone -acetaminophen ], and Plaquenil  [hydroxychloroquine ]    Review of Systems  Neurological:  Positive for headaches.    Updated Vital Signs BP 112/76   Pulse 74   Temp 98.2 F (36.8 C) (Oral)   Resp 17   Ht 5' 11 (1.803 m)   Wt 83.9 kg   SpO2 98%   BMI 25.80 kg/m   Physical Exam Vitals and nursing note reviewed.  Constitutional:      General: He is not in acute distress.    Appearance: Normal appearance. He is  normal weight. He is not ill-appearing, toxic-appearing or diaphoretic.  HENT:     Head: Normocephalic and atraumatic.     Right Ear: Tympanic membrane, ear canal and external ear normal.     Left Ear: Tympanic membrane, ear canal and external ear normal.     Nose: Nose normal.     Mouth/Throat:     Mouth: Mucous membranes are moist.     Pharynx: Oropharynx is clear. No oropharyngeal exudate or posterior oropharyngeal erythema.  Eyes:     General: No scleral icterus.    Extraocular Movements: Extraocular movements intact.     Conjunctiva/sclera: Conjunctivae normal.     Pupils: Pupils are equal, round, and reactive to light.     Comments: photophobia  Cardiovascular:     Rate and Rhythm: Regular rhythm. Tachycardia present.     Pulses: Normal pulses.     Heart sounds: Normal heart sounds.  Pulmonary:     Effort: Pulmonary effort is normal. No respiratory distress.     Breath sounds: Normal breath sounds. No stridor. No wheezing, rhonchi or rales.  Abdominal:     General: Abdomen is flat. Bowel sounds are normal. There is no distension.     Palpations: Abdomen is soft.     Tenderness: There is no abdominal tenderness. There is no guarding.  Musculoskeletal:        General: Normal range of motion.     Cervical back: Normal range of motion. No rigidity.     Right lower leg: No edema.     Left lower leg: No edema.  Skin:    General: Skin is warm and dry.     Capillary Refill: Capillary refill takes less than 2 seconds.     Coloration: Skin is not jaundiced or pale.  Neurological:     General: No focal deficit present.     Mental Status: He is alert and oriented to person, place, and time.     Sensory: No sensory deficit.     Motor: No weakness.     Coordination: Coordination normal.     Gait: Gait normal.     (all labs ordered are listed, but only abnormal results are displayed) Labs Reviewed  RESP PANEL BY RT-PCR (RSV, FLU A&B, COVID)  RVPGX2 - Abnormal; Notable for the  following components:      Result Value   Influenza A by PCR POSITIVE (*)    All other components within normal limits  CBC  BASIC METABOLIC PANEL WITH GFR    EKG: None  Radiology: No results found.   Procedures   Medications Ordered in the ED  sodium chloride  0.9 % bolus 1,000 mL (0 mLs Intravenous Stopped 02/17/24 2256)  ketorolac  (TORADOL ) 15 MG/ML injection 15 mg (15 mg Intravenous Given 02/17/24 2155)  metoCLOPramide  (REGLAN ) injection 10 mg (10 mg Intravenous Given 02/17/24 2155)     Patient presents  to the ED for concern of migraine, this involves an extensive number of treatment options, and is a complaint that carries with it a high risk of complications and morbidity.  The differential diagnosis includes migraine, viral illness, dehydration, electrolyte derangement, meningitis, intracranial mass or hemorrhage.   Co morbidities that complicate the patient evaluation  Migraines   Lab Tests:  I Ordered, and personally interpreted labs.  The pertinent results include: Respiratory panel positive for influenza A.  No white count. BMP with no electrolyte derangement.   Medicines ordered and prescription drug management:  I ordered medication including toradol , reglan , NS bolus  for migraine.  Reevaluation of the patient after these medicines showed that the patient improved I have reviewed the patients home medicines and have made adjustments as needed   Problem List / ED Course:     Patient with history of migraines. History and physical exam findings are not suspicious for meningitis or intracranial mass or hemorrhage. Labs show no evidence of systemic illness or electrolyte derangement. Normal renal function.   Migraine. Headache lasting two weeks with history of migraines. No traumatic injury or red flag findings. Patient initially tachycardic with elevated BP. It is reassuring these vital signs and symptoms improved with treatment. I do not feel additional  emergent work up warranted at this time. Encouraged follow up with primary care for ongoing evaluation. Also given local neurology office information and he can make an appointment as needed if symptoms persist. Return precautions discussed and patient verbalized understanding. Influenza A. Likely incidental finding of Influenza A, though this may be contributing to patient's migraine. Spoke with patient about viral URI, outpatient treatment, return precautions and they verbalized understanding.  Stable for discharge.   Reevaluation:  After the interventions noted above, I reevaluated the patient and found that they have :improved   Dispostion:  After consideration of the diagnostic results and the patients response to treatment, I feel that the patient would benefit from supportive care in the home setting with continued monitoring of symptoms and close follow-up with primary care. If symptoms persist, establishing with outpatient neurology for ongoing management of migraines may be warranted. Return precautions given.                                    Medical Decision Making  This note was produced using Dragon Medical voice recognition. While the provider has reviewed and verified all clinical information, transcription errors may remain.    Final diagnoses:  Influenza A  Intractable migraine without status migrainosus, unspecified migraine type    ED Discharge Orders     None          Rosina Almarie LABOR, PA-C 02/17/24 2309  "
